# Patient Record
Sex: Male | Born: 1935 | Race: Black or African American | Hispanic: No | Marital: Married | Smoking: Former smoker
Health system: Southern US, Community
[De-identification: ages and names within clinical notes are randomized; demographics above are authoritative.]

## PROBLEM LIST (undated history)

## (undated) DIAGNOSIS — H3530 Unspecified macular degeneration: Secondary | ICD-10-CM

## (undated) DIAGNOSIS — F32A Depression, unspecified: Secondary | ICD-10-CM

## (undated) DIAGNOSIS — H269 Unspecified cataract: Secondary | ICD-10-CM

## (undated) DIAGNOSIS — K635 Polyp of colon: Secondary | ICD-10-CM

## (undated) DIAGNOSIS — F329 Major depressive disorder, single episode, unspecified: Secondary | ICD-10-CM

## (undated) DIAGNOSIS — M1990 Unspecified osteoarthritis, unspecified site: Secondary | ICD-10-CM

## (undated) DIAGNOSIS — I10 Essential (primary) hypertension: Secondary | ICD-10-CM

## (undated) DIAGNOSIS — H409 Unspecified glaucoma: Secondary | ICD-10-CM

## (undated) HISTORY — PX: POLYPECTOMY: SHX149

## (undated) HISTORY — DX: Polyp of colon: K63.5

## (undated) HISTORY — DX: Unspecified glaucoma: H40.9

## (undated) HISTORY — PX: COLONOSCOPY: SHX174

## (undated) HISTORY — DX: Unspecified cataract: H26.9

## (undated) HISTORY — DX: Essential (primary) hypertension: I10

## (undated) HISTORY — PX: CATARACT EXTRACTION: SUR2

## (undated) HISTORY — DX: Unspecified macular degeneration: H35.30

## (undated) HISTORY — DX: Depression, unspecified: F32.A

## (undated) HISTORY — PX: MULTIPLE TOOTH EXTRACTIONS: SHX2053

## (undated) HISTORY — DX: Unspecified osteoarthritis, unspecified site: M19.90

## (undated) HISTORY — DX: Major depressive disorder, single episode, unspecified: F32.9

---

## 2010-06-02 ENCOUNTER — Emergency Department (HOSPITAL_COMMUNITY): Admission: EM | Admit: 2010-06-02 | Discharge: 2010-06-02 | Payer: Self-pay | Admitting: Emergency Medicine

## 2011-07-16 ENCOUNTER — Telehealth: Payer: Self-pay | Admitting: Family Medicine

## 2011-07-16 NOTE — Telephone Encounter (Signed)
I think we should make exception in this case as I see wife.

## 2011-07-16 NOTE — Telephone Encounter (Signed)
Pt would like to establish as new medicare pt. Pt wife is seen by you and is requesting you see her husband as well. Pt was informed you are not excepting new medicare pt at this time. Please advise

## 2011-08-19 ENCOUNTER — Ambulatory Visit (INDEPENDENT_AMBULATORY_CARE_PROVIDER_SITE_OTHER): Payer: 59 | Admitting: Family Medicine

## 2011-08-19 ENCOUNTER — Encounter: Payer: Self-pay | Admitting: Family Medicine

## 2011-08-19 VITALS — BP 128/78 | HR 80 | Temp 98.5°F | Resp 12 | Ht 67.0 in | Wt 196.0 lb

## 2011-08-19 DIAGNOSIS — Z0000 Encounter for general adult medical examination without abnormal findings: Secondary | ICD-10-CM

## 2011-08-19 LAB — HEPATIC FUNCTION PANEL
AST: 16 U/L (ref 0–37)
Alkaline Phosphatase: 81 U/L (ref 39–117)
Bilirubin, Direct: 0.1 mg/dL (ref 0.0–0.3)

## 2011-08-19 LAB — CBC WITH DIFFERENTIAL/PLATELET
Basophils Absolute: 0 10*3/uL (ref 0.0–0.1)
Eosinophils Relative: 1.6 % (ref 0.0–5.0)
HCT: 41.8 % (ref 39.0–52.0)
Hemoglobin: 14.1 g/dL (ref 13.0–17.0)
Lymphocytes Relative: 33.8 % (ref 12.0–46.0)
Lymphs Abs: 2.5 10*3/uL (ref 0.7–4.0)
Monocytes Relative: 8.4 % (ref 3.0–12.0)
Neutro Abs: 4.1 10*3/uL (ref 1.4–7.7)
RDW: 15 % — ABNORMAL HIGH (ref 11.5–14.6)
WBC: 7.4 10*3/uL (ref 4.5–10.5)

## 2011-08-19 LAB — BASIC METABOLIC PANEL
BUN: 18 mg/dL (ref 6–23)
CO2: 31 mEq/L (ref 19–32)
Calcium: 9.1 mg/dL (ref 8.4–10.5)
Chloride: 107 mEq/L (ref 96–112)
Creatinine, Ser: 1.2 mg/dL (ref 0.4–1.5)
Glucose, Bld: 94 mg/dL (ref 70–99)

## 2011-08-19 LAB — LIPID PANEL
LDL Cholesterol: 104 mg/dL — ABNORMAL HIGH (ref 0–99)
Total CHOL/HDL Ratio: 3
Triglycerides: 78 mg/dL (ref 0.0–149.0)

## 2011-08-19 LAB — POCT URINALYSIS DIPSTICK
Blood, UA: NEGATIVE
Glucose, UA: NEGATIVE
Ketones, UA: NEGATIVE
Spec Grav, UA: 1.025

## 2011-08-19 LAB — TSH: TSH: 3.38 u[IU]/mL (ref 0.35–5.50)

## 2011-08-19 MED ORDER — PNEUMOCOCCAL VAC POLYVALENT 25 MCG/0.5ML IJ INJ: 0.5000 mL | INJECTION | Freq: Once | INTRAMUSCULAR | Status: DC

## 2011-08-19 MED ORDER — TETANUS-DIPHTH-ACELL PERTUSSIS 5-2.5-18.5 LF-MCG/0.5 IM SUSP: 0.5000 mL | Freq: Once | INTRAMUSCULAR | Status: DC

## 2011-08-19 NOTE — Patient Instructions (Signed)
Check on insurance coverage for shingles vaccine. 

## 2011-08-19 NOTE — Progress Notes (Signed)
  Subjective:    Patient ID: Ethan Anderson, male    DOB: 07-12-1935, 77 y.o.   MRN: [id number]  HPI  New patient to establish care and for well visit. Patient has history of reported macular degeneration. Remarkably healthy. Takes no medications. Remote history of depression currently stable off medication. No surgeries other than laser surgery for left eye secondary to reported retinal hemorrhage. Family history significant for mother with ovarian cancer otherwise unrevealing.  Patient is married. Quit smoking in his 19s. No alcohol use. Stays very active without outdoor activities.  No history of recent tetanus. No history of Pneumovax. Declines flu vaccine. No history of colon cancer screening.  Review of Systems  Constitutional: Negative for fever, activity change, appetite change and fatigue.  HENT: Negative for ear pain, congestion and trouble swallowing.   Eyes: Positive for visual disturbance. Negative for photophobia, pain, discharge, redness and itching.  Respiratory: Negative for cough, shortness of breath and wheezing.   Cardiovascular: Negative for chest pain and palpitations.  Gastrointestinal: Negative for nausea, vomiting, abdominal pain, diarrhea, constipation, blood in stool, abdominal distention and rectal pain.  Genitourinary: Negative for dysuria, hematuria and testicular pain.  Musculoskeletal: Negative for joint swelling and arthralgias.  Skin: Negative for rash.  Neurological: Negative for dizziness, syncope and headaches.  Hematological: Negative for adenopathy.  Psychiatric/Behavioral: Negative for confusion and dysphoric mood.       Objective:   Physical Exam  Constitutional: He is oriented to person, place, and time. He appears well-developed and well-nourished. No distress.  HENT:  Head: Normocephalic and atraumatic.  Right Ear: External ear normal.  Left Ear: External ear normal.  Mouth/Throat: Oropharynx is clear and moist.  Eyes: Conjunctivae and EOM  are normal. Pupils are equal, round, and reactive to light.  Neck: Normal range of motion. Neck supple. No thyromegaly present.  Cardiovascular: Normal rate, regular rhythm and normal heart sounds.   No murmur heard. Pulmonary/Chest: No respiratory distress. He has no wheezes. He has no rales.  Abdominal: Soft. Bowel sounds are normal. He exhibits no distension and no mass. There is no tenderness. There is no rebound and no guarding.  Genitourinary: Rectum normal and prostate normal.  Musculoskeletal: He exhibits no edema.  Lymphadenopathy:    He has no cervical adenopathy.  Neurological: He is alert and oriented to person, place, and time. He displays normal reflexes. No cranial nerve deficit.  Skin: No rash noted.  Psychiatric: He has a normal mood and affect. His behavior is normal. Judgment and thought content normal.          Assessment & Plan:  Health maintenance. Baseline labs obtained. Discussed pros and cons of PSA and he is interested in obtaining. Schedule screening colonoscopy. Tetanus and Pneumovax given. Influenza recommended and patient declines. He and wife will check on coverage for shingles vaccine.

## 2011-08-20 NOTE — Progress Notes (Signed)
Quick Note:  Pt aware ______ 

## 2011-09-01 ENCOUNTER — Encounter: Payer: Self-pay | Admitting: Gastroenterology

## 2011-09-23 ENCOUNTER — Ambulatory Visit (AMBULATORY_SURGERY_CENTER): Payer: 59

## 2011-09-23 VITALS — Ht 67.0 in | Wt 198.5 lb

## 2011-09-23 DIAGNOSIS — Z1211 Encounter for screening for malignant neoplasm of colon: Secondary | ICD-10-CM

## 2011-09-23 MED ORDER — PEG-KCL-NACL-NASULF-NA ASC-C 100 G PO SOLR: 1.0000 | Freq: Once | ORAL | Status: AC

## 2011-10-07 ENCOUNTER — Ambulatory Visit (AMBULATORY_SURGERY_CENTER): Payer: 59 | Admitting: Gastroenterology

## 2011-10-07 ENCOUNTER — Encounter: Payer: Self-pay | Admitting: Gastroenterology

## 2011-10-07 VITALS — BP 150/97 | HR 80 | Temp 97.0°F | Resp 11 | Ht 67.0 in | Wt 198.0 lb

## 2011-10-07 DIAGNOSIS — D126 Benign neoplasm of colon, unspecified: Secondary | ICD-10-CM

## 2011-10-07 DIAGNOSIS — Z1211 Encounter for screening for malignant neoplasm of colon: Secondary | ICD-10-CM

## 2011-10-07 MED ORDER — SODIUM CHLORIDE 0.9 % IV SOLN: 500.0000 mL | INTRAVENOUS | Status: DC

## 2011-10-07 NOTE — Progress Notes (Signed)
Patient did not experience any of the following events: a burn prior to discharge; a fall within the facility; wrong site/side/patient/procedure/implant event; or a hospital transfer or hospital admission upon discharge from the facility. (G8907) Patient did not have preoperative order for IV antibiotic SSI prophylaxis. (G8918)  

## 2011-10-07 NOTE — Patient Instructions (Signed)
YOU HAD AN ENDOSCOPIC PROCEDURE TODAY AT THE [facility]: Refer to the procedure report that was given to you for any specific questions about what was found during the examination.  If the procedure report does not answer your questions, please call your gastroenterologist to clarify.  If you requested that your care partner not be given the details of your procedure findings, then the procedure report has been included in a sealed envelope for you to review at your convenience later.  YOU SHOULD EXPECT: Some feelings of bloating in the abdomen. Passage of more gas than usual.  Walking can help get rid of the air that was put into your GI tract during the procedure and reduce the bloating. If you had a lower endoscopy (such as a colonoscopy or flexible sigmoidoscopy) you may notice spotting of blood in your stool or on the toilet paper. If you underwent a bowel prep for your procedure, then you may not have a normal bowel movement for a few days.  DIET: Your first meal following the procedure should be a light meal and then it is ok to progress to your normal diet.  A half-sandwich or bowl of soup is an example of a good first meal.  Heavy or fried foods are harder to digest and may make you feel nauseous or bloated.  Likewise meals heavy in dairy and vegetables can cause extra gas to form and this can also increase the bloating.  Drink plenty of fluids but you should avoid alcoholic beverages for 24 hours.  ACTIVITY: Your care partner should take you home directly after the procedure.  You should plan to take it easy, moving slowly for the rest of the day.  You can resume normal activity the day after the procedure however you should NOT DRIVE or use heavy machinery for 24 hours (because of the sedation medicines used during the test).    SYMPTOMS TO REPORT IMMEDIATELY: A gastroenterologist can be reached at any hour.  During normal business hours, 8:30 AM to 5:00 PM Monday through Friday,  call [phone].  After hours and on weekends, please call the GI answering service at [phone] who will take a message and have the physician on call contact you.   Following lower endoscopy (colonoscopy or flexible sigmoidoscopy):  Excessive amounts of blood in the stool  Significant tenderness or worsening of abdominal pains  Swelling of the abdomen that is new, acute  Fever of 100F or higher    FOLLOW UP: If any biopsies were taken you will be contacted by phone or by letter within the next 1-3 weeks.  Call your gastroenterologist if you have not heard about the biopsies in 3 weeks.  Our staff will call the home number listed on your records the next business day following your procedure to check on you and address any questions or concerns that you may have at that time regarding the information given to you following your procedure. This is a courtesy call and so if there is no answer at the home number and we have not heard from you through the emergency physician on call, we will assume that you have returned to your regular daily activities without incident.  SIGNATURES/CONFIDENTIALITY: You and/or your care partner have signed paperwork which will be entered into your electronic medical record.  These signatures attest to the fact that that the information above on your After Visit Summary has been reviewed and is understood.  Full responsibility of the confidentiality   of this discharge information lies with you and/or your care-partner.     

## 2011-10-07 NOTE — Progress Notes (Signed)
Propofol per [facility]. See scanned intra procedure report. ewm 

## 2011-10-07 NOTE — Op Note (Signed)
[facility] [address] [place], [place]  [address]  COLONOSCOPY PROCEDURE REPORT  PATIENT:  Ethan Anderson, Ethan Anderson  MR#:  [id number] BIRTHDATE:  09-26-1934, 76 yrs. old  GENDER:  male ENDOSCOPIST:  Barbette Hair. Arlyce Dice, MD REF. BY:  Evelena Peat, M.D. PROCEDURE DATE:  10/07/2011 PROCEDURE:  Colonoscopy with polypectomy and submucosal injection, Colonoscopy with snare polypectomy, Colonoscopy with hot biopsy ASA CLASS:  Class II INDICATIONS:  Routine Risk Screening MEDICATIONS:   MAC sedation, administered by CRNA propofol 400mg IV  DESCRIPTION OF PROCEDURE:   After the risks benefits and alternatives of the procedure were thoroughly explained, informed consent was obtained.  Digital rectal exam was performed and revealed no abnormalities.   The [id number] endoscope was introduced through the anus and advanced to the cecum, which was identified by the ileocecal valve, Moderate amount of retained liquid stool  The quality of the prep was Moviprep fair.  The instrument was then slowly withdrawn as the colon was fully examined. <<PROCEDUREIMAGES>>  FINDINGS:  A sessile polyp was found in the cecum. Sessile 2.5-3cm polyp. Submucosa was injected with 7cc NS. Polyp was removed with polypectomy snare utilizing coagulation. A small remnant was removed with hot biopsy forceps. A single clip was placed across the mucosal defect (see image5 and image8).  A sessile polyp was found in the ascending colon. It was 12 mm in size. Polyp was snared without cautery. Retrieval was successful (see image9). snare polyp  This was otherwise a normal examination of the colon (see image2 and image10).   Retroflexed views in the rectum revealed no abnormalities.    The time to cecum =  1) 2.50 minutes. The scope was then withdrawn in  1) 29.50  minutes from the cecum and the procedure completed. COMPLICATIONS:  None ENDOSCOPIC IMPRESSION: 1) Sessile polyp in the cecum 2) 12 mm sessile polyp in the  ascending colon 3) Otherwise normal examination RECOMMENDATIONS: 1) Await biopsy results REPEAT EXAM:  You will receive a letter from Dr. Arlyce Dice in 1-2 weeks, after reviewing the final pathology, with followup recommendations.  ______________________________ Barbette Hair Arlyce Dice, MD  CC:  n. eSIGNED:   Barbette Hair. Damarea Merkel at 10/07/2011 11:18 AM  Clent Jacks, [phone]

## 2011-10-08 ENCOUNTER — Telehealth: Payer: Self-pay | Admitting: *Deleted

## 2011-10-08 NOTE — Telephone Encounter (Signed)
  Follow up Call-  Call back number 10/07/2011  Post procedure Call Back phone  # [phone]  Permission to leave phone message Yes     Patient questions:  Do you have a fever, pain , or abdominal swelling? no Pain Score  0 *  Have you tolerated food without any problems? yes  Have you been able to return to your normal activities? yes  Do you have any questions about your discharge instructions: Diet   no Medications  no Follow up visit  no  Do you have questions or concerns about your Care? no  Actions: * If pain score is 4 or above: No action needed, pain <4.

## 2011-10-14 ENCOUNTER — Encounter: Payer: Self-pay | Admitting: Gastroenterology

## 2012-02-08 ENCOUNTER — Emergency Department (HOSPITAL_COMMUNITY): Payer: 59

## 2012-02-08 ENCOUNTER — Encounter (HOSPITAL_COMMUNITY): Payer: Self-pay | Admitting: Emergency Medicine

## 2012-02-08 ENCOUNTER — Emergency Department (HOSPITAL_COMMUNITY)
Admission: EM | Admit: 2012-02-08 | Discharge: 2012-02-08 | Disposition: A | Discharge status: Home / Self Care | Payer: 59 | Attending: Emergency Medicine | Admitting: Emergency Medicine

## 2012-02-08 DIAGNOSIS — F329 Major depressive disorder, single episode, unspecified: Secondary | ICD-10-CM | POA: Insufficient documentation

## 2012-02-08 DIAGNOSIS — R112 Nausea with vomiting, unspecified: Secondary | ICD-10-CM | POA: Insufficient documentation

## 2012-02-08 DIAGNOSIS — R109 Unspecified abdominal pain: Secondary | ICD-10-CM

## 2012-02-08 DIAGNOSIS — F3289 Other specified depressive episodes: Secondary | ICD-10-CM | POA: Insufficient documentation

## 2012-02-08 DIAGNOSIS — H3530 Unspecified macular degeneration: Secondary | ICD-10-CM | POA: Insufficient documentation

## 2012-02-08 DIAGNOSIS — M549 Dorsalgia, unspecified: Secondary | ICD-10-CM | POA: Insufficient documentation

## 2012-02-08 MED ORDER — HYOSCYAMINE SULFATE 0.125 MG SL SUBL
SUBLINGUAL_TABLET | SUBLINGUAL | Status: AC
  Administered 2012-02-08: 375 ug
  Filled 2012-02-08: qty 3

## 2012-02-08 MED ORDER — HYOSCYAMINE SULFATE CR 0.375 MG PO CP12: 0.3750 mg | ORAL_CAPSULE | Freq: Two times a day (BID) | ORAL | Status: DC | PRN

## 2012-02-08 MED ORDER — HYOSCYAMINE SULFATE 0.125 MG PO TABS: 0.3750 mg | ORAL_TABLET | Freq: Once | ORAL | Status: DC

## 2012-02-08 NOTE — ED Provider Notes (Cosign Needed)
History   This chart was scribed for Benny Lennert, MD by Ethan Anderson . The patient was seen in room APA04/APA04.    CSN: [id number]  Arrival date & time 02/08/12  1757   First MD Initiated Contact with Patient 02/08/12 1807      Chief Complaint  Patient presents with  . Abdominal Pain  . Back Pain    (Consider location/radiation/quality/duration/timing/severity/associated sxs/prior treatment) HPI Comments: Ethan Anderson is a 77 y.o. male who presents to the Emergency Department complaining of constant, mild to moderate, abdominal distension. Patient states that his abdomen feels "tight". Patient denies any abdominal pain. Patient reports associated n/v/d. Patient also reports mid back pain, and describes the back pain as stiffness. Patient states that his last BM was this morning. Wife states that the patient has had these symptoms previously, thought it was gas, and the symptoms subsided on their own. Patient denies taking any medications regularly.   Patient is a 77 y.o. male presenting with abdominal pain. The history is provided by the patient.  Abdominal Pain The primary symptoms of the illness include nausea, vomiting and diarrhea. The primary symptoms of the illness do not include abdominal pain or fever. The current episode started 13 to 24 hours ago. The onset of the illness was gradual. The problem has been gradually worsening.  The vomiting began today. Vomiting occurred once. The emesis contains stomach contents.  The patient has had a change in bowel habit. Additional symptoms associated with the illness include back pain. Symptoms associated with the illness do not include constipation.    Past Medical History  Diagnosis Date  . Depression   . Macular degeneration     History reviewed. No pertinent past surgical history.  Family History  Problem Relation Age of Onset  . Cancer Mother     ovarian  . Stomach cancer Mother   . Alcohol abuse Cousin   .  Colon cancer Neg Hx   . Esophageal cancer Neg Hx   . Rectal cancer Neg Hx     History  Substance Use Topics  . Smoking status: Former Smoker -- 1.0 packs/day for 10 years    Types: Cigarettes    Quit date: 08/18/1990  . Smokeless tobacco: Never Used  . Alcohol Use: No      Review of Systems  Constitutional: Negative for fever.  Gastrointestinal: Positive for nausea, vomiting and diarrhea. Negative for abdominal pain and constipation.  Musculoskeletal: Positive for back pain.  All other systems reviewed and are negative.    Allergies  Review of patient's allergies indicates no known allergies.  Home Medications  No current outpatient prescriptions on file.  BP 174/81  Pulse 72  Temp 98 F (36.7 C) (Oral)  Resp 18  Ht 5\' 9"  (1.753 m)  Wt 180 lb (81.647 kg)  BMI 26.58 kg/m2  SpO2 100%  Physical Exam  Constitutional: He is oriented to person, place, and time. He appears well-developed.  HENT:  Head: Normocephalic and atraumatic.  Eyes: Conjunctivae and EOM are normal. No scleral icterus.  Neck: Neck supple. No thyromegaly present.  Cardiovascular: Normal rate and regular rhythm.  Exam reveals no gallop and no friction rub.   No murmur heard. Pulmonary/Chest: No stridor. He has no wheezes. He has no rales. He exhibits no tenderness.  Abdominal: He exhibits distension. There is no tenderness. There is no rebound.       Mild distension of abdomen.   Musculoskeletal: Normal range of motion. He exhibits  no edema.  Lymphadenopathy:    He has no cervical adenopathy.  Neurological: He is oriented to person, place, and time. Coordination normal.  Skin: No rash noted. No erythema.  Psychiatric: He has a normal mood and affect. His behavior is normal.    ED Course  Procedures (including critical care time)  DIAGNOSTIC STUDIES: Oxygen Saturation is 100% on room air, normal by my interpretation.    COORDINATION OF CARE:  1825: Discussed planned course of treatment  with the patient, who is agreeable at this time.  1900: Recheck: Informed patient of imaging results. Discussed f/u PCP if symptoms worsen.    Labs Reviewed - No data to display Dg Abd Acute W/chest  02/08/2012  *RADIOLOGY REPORT*  Clinical Data: Abdominal distention.  Nausea vomiting.  ACUTE ABDOMEN SERIES (ABDOMEN 2 VIEW & CHEST 1 VIEW)  Comparison: None  Findings: The heart size and mediastinal contours are within normal limits.  Both lungs are clear.  The visualized skeletal structures are unremarkable.  The bowel gas pattern is nonobstructed. There are a few air-filled loops of small bowel within the central abdomen containing air fluid levels.  Gas and stool noted within the right colon.  IMPRESSION:  1.  No acute cardiopulmonary abnormalities. 2. Nonspecific bowel gas pattern without evidence for high-grade bowel obstruction.  Original Report Authenticated By: Rosealee Albee, M.D.     No diagnosis found.    MDM    The chart was scribed for me under my direct supervision.  I personally performed the history, physical, and medical decision making and all procedures in the evaluation of this patient.Benny Lennert, MD 02/08/12 [phone]

## 2012-02-08 NOTE — Discharge Instructions (Signed)
Plenty of fluids.  Follow up with your md if not improving °

## 2012-02-08 NOTE — ED Notes (Signed)
Patient with c/o abdominal distention and mid back pain. Patient's wife reports patient has been fatigued and had some shortness of breath with exertion.

## 2012-02-24 ENCOUNTER — Encounter (INDEPENDENT_AMBULATORY_CARE_PROVIDER_SITE_OTHER): Payer: 59 | Admitting: Ophthalmology

## 2012-02-24 DIAGNOSIS — H2510 Age-related nuclear cataract, unspecified eye: Secondary | ICD-10-CM

## 2012-02-24 DIAGNOSIS — H3530 Unspecified macular degeneration: Secondary | ICD-10-CM

## 2012-02-24 DIAGNOSIS — H43819 Vitreous degeneration, unspecified eye: Secondary | ICD-10-CM

## 2012-02-24 DIAGNOSIS — H348192 Central retinal vein occlusion, unspecified eye, stable: Secondary | ICD-10-CM

## 2012-08-24 ENCOUNTER — Encounter: Payer: Self-pay | Admitting: Gastroenterology

## 2012-09-03 ENCOUNTER — Encounter: Payer: Self-pay | Admitting: Gastroenterology

## 2012-09-09 ENCOUNTER — Ambulatory Visit (AMBULATORY_SURGERY_CENTER): Payer: 59 | Admitting: *Deleted

## 2012-09-09 ENCOUNTER — Encounter: Payer: Self-pay | Admitting: Gastroenterology

## 2012-09-09 VITALS — Ht 66.0 in | Wt 199.0 lb

## 2012-09-09 DIAGNOSIS — Z1211 Encounter for screening for malignant neoplasm of colon: Secondary | ICD-10-CM

## 2012-09-09 DIAGNOSIS — Z86010 Personal history of colonic polyps: Secondary | ICD-10-CM

## 2012-09-09 MED ORDER — NA SULFATE-K SULFATE-MG SULF 17.5-3.13-1.6 GM/177ML PO SOLN: 1.0000 | Freq: Once | ORAL | Status: DC

## 2012-09-09 NOTE — Progress Notes (Signed)
No egg or soy allergy. ewm No problems with sedation. ewm Nothing electrical in heart. ewm

## 2012-09-21 ENCOUNTER — Encounter: Payer: Self-pay | Admitting: Gastroenterology

## 2012-09-21 ENCOUNTER — Ambulatory Visit (AMBULATORY_SURGERY_CENTER): Payer: 59 | Admitting: Gastroenterology

## 2012-09-21 VITALS — BP 148/95 | HR 73 | Temp 97.9°F | Resp 12 | Ht 66.0 in | Wt 199.0 lb

## 2012-09-21 DIAGNOSIS — D126 Benign neoplasm of colon, unspecified: Secondary | ICD-10-CM

## 2012-09-21 DIAGNOSIS — Z1211 Encounter for screening for malignant neoplasm of colon: Secondary | ICD-10-CM

## 2012-09-21 DIAGNOSIS — Z86010 Personal history of colonic polyps: Secondary | ICD-10-CM

## 2012-09-21 MED ORDER — SODIUM CHLORIDE 0.9 % IV SOLN: 500.0000 mL | INTRAVENOUS | Status: DC

## 2012-09-21 NOTE — Progress Notes (Signed)
Called to room to assist during endoscopic procedure.  Patient ID and intended procedure confirmed with present staff. Received instructions for my participation in the procedure from the performing physician.  

## 2012-09-21 NOTE — Op Note (Signed)
[facility] [address] [place], [address]   COLONOSCOPY PROCEDURE REPORT  PATIENT: Ethan Anderson, Ethan Anderson  MR#: [id number] BIRTHDATE: 03-28-1935 , 77  yrs. old GENDER: Male ENDOSCOPIST: Louis Meckel, MD REFERRED XB:JYNWG Caryl Never, M.D. PROCEDURE DATE:  09/21/2012 PROCEDURE:   Colonoscopy with cold biopsy polypectomy ASA CLASS:   Class II INDICATIONS:Patient's personal history of colon polyps; sessile cecal polyp one year ago. MEDICATIONS: MAC sedation, administered by CRNA and propofol (Diprivan) 200mg  IV  DESCRIPTION OF PROCEDURE:   After the risks benefits and alternatives of the procedure were thoroughly explained, informed consent was obtained.  A digital rectal exam revealed no abnormalities of the rectum.   The LB [id number]  endoscope was introduced through the anus and advanced to the cecum, which was identified by both the appendix and ileocecal valve. No adverse events experienced.   The quality of the prep was Suprep excellent The instrument was then slowly withdrawn as the colon was fully examined.      COLON FINDINGS: A flat polyp measuring 2 mm in size was found in the ascending colon.  A polypectomy was performed with cold forceps. The resection was complete and the polyp tissue was completely retrieved.   The colon mucosa was otherwise normal.  Retroflexed views revealed no abnormalities. The time to cecum=3 minutes 08 seconds.  Withdrawal time=9 minutes 48 seconds.  The scope was withdrawn and the procedure completed. COMPLICATIONS: There were no complications.  ENDOSCOPIC IMPRESSION: 1.   Flat polyp measuring 2 mm in size was found in the ascending colon; polypectomy was performed with cold forceps 2.   The colon mucosa was otherwise normal  RECOMMENDATIONS: Colonoscopy in 3 years   eSigned:  Louis Meckel, MD 09/21/2012 12:31 PM   cc:

## 2012-09-21 NOTE — Progress Notes (Signed)
Patient did not experience any of the following events: a burn prior to discharge; a fall within the facility; wrong site/side/patient/procedure/implant event; or a hospital transfer or hospital admission upon discharge from the facility. (G8907) Patient did not have preoperative order for IV antibiotic SSI prophylaxis. (G8918)  

## 2012-09-21 NOTE — Patient Instructions (Addendum)
YOU HAD AN ENDOSCOPIC PROCEDURE TODAY AT THE [facility]: Refer to the procedure report that was given to you for any specific questions about what was found during the examination.  If the procedure report does not answer your questions, please call your gastroenterologist to clarify.  If you requested that your care partner not be given the details of your procedure findings, then the procedure report has been included in a sealed envelope for you to review at your convenience later.  YOU SHOULD EXPECT: Some feelings of bloating in the abdomen. Passage of more gas than usual.  Walking can help get rid of the air that was put into your GI tract during the procedure and reduce the bloating. If you had a lower endoscopy (such as a colonoscopy or flexible sigmoidoscopy) you may notice spotting of blood in your stool or on the toilet paper. If you underwent a bowel prep for your procedure, then you may not have a normal bowel movement for a few days.  DIET: Your first meal following the procedure should be a light meal and then it is ok to progress to your normal diet.  A half-sandwich or bowl of soup is an example of a good first meal.  Heavy or fried foods are harder to digest and may make you feel nauseous or bloated.  Likewise meals heavy in dairy and vegetables can cause extra gas to form and this can also increase the bloating.  Drink plenty of fluids but you should avoid alcoholic beverages for 24 hours.  ACTIVITY: Your care partner should take you home directly after the procedure.  You should plan to take it easy, moving slowly for the rest of the day.  You can resume normal activity the day after the procedure however you should NOT DRIVE or use heavy machinery for 24 hours (because of the sedation medicines used during the test).    SYMPTOMS TO REPORT IMMEDIATELY: A gastroenterologist can be reached at any hour.  During normal business hours, 8:30 AM to 5:00 PM Monday through Friday,  call [phone].  After hours and on weekends, please call the GI answering service at [phone] emergency number  who will take a message and have the physician on call contact you.   Following lower endoscopy (colonoscopy or flexible sigmoidoscopy):  Excessive amounts of blood in the stool  Significant tenderness or worsening of abdominal pains  Swelling of the abdomen that is new, acute  Fever of 100F or higher  FOLLOW UP: If any biopsies were taken you will be contacted by phone or by letter within the next 1-3 weeks.  Call your gastroenterologist if you have not heard about the biopsies in 3 weeks.  Our staff will call the home number listed on your records the next business day following your procedure to check on you and address any questions or concerns that you may have at that time regarding the information given to you following your procedure. This is a courtesy call and so if there is no answer at the home number and we have not heard from you through the emergency physician on call, we will assume that you have returned to your regular daily activities without incident.  SIGNATURES/CONFIDENTIALITY: You and/or your care partner have signed paperwork which will be entered into your electronic medical record.  These signatures attest to the fact that that the information above on your After Visit Summary has been reviewed and is understood.  Full responsibility of the   confidentiality of this discharge information lies with you and/or your care-partner.  Handout on polyps Repeat colon in 3 years

## 2012-09-22 ENCOUNTER — Telehealth: Payer: Self-pay | Admitting: *Deleted

## 2012-09-22 NOTE — Telephone Encounter (Signed)
  Follow up Call-  Call back number 09/21/2012 10/07/2011  Post procedure Call Back phone  # [phone]  Permission to leave phone message Yes Yes     Patient questions:  Do you have a fever, pain , or abdominal swelling? no Pain Score  0 *  Have you tolerated food without any problems? yes  Have you been able to return to your normal activities? yes  Do you have any questions about your discharge instructions: Diet   no Medications  no Follow up visit  no  Do you have questions or concerns about your Care? no  Actions: * If pain score is 4 or above: No action needed, pain <4.

## 2012-09-29 ENCOUNTER — Encounter: Payer: Self-pay | Admitting: Gastroenterology

## 2013-02-23 ENCOUNTER — Ambulatory Visit (INDEPENDENT_AMBULATORY_CARE_PROVIDER_SITE_OTHER): Payer: Medicare Other | Admitting: Ophthalmology

## 2013-02-23 DIAGNOSIS — H2510 Age-related nuclear cataract, unspecified eye: Secondary | ICD-10-CM

## 2013-02-23 DIAGNOSIS — H43819 Vitreous degeneration, unspecified eye: Secondary | ICD-10-CM

## 2013-02-23 DIAGNOSIS — H3530 Unspecified macular degeneration: Secondary | ICD-10-CM

## 2013-02-23 DIAGNOSIS — H348192 Central retinal vein occlusion, unspecified eye, stable: Secondary | ICD-10-CM

## 2013-07-08 ENCOUNTER — Telehealth: Payer: Self-pay | Admitting: Family Medicine

## 2013-07-08 NOTE — Telephone Encounter (Signed)
Patient Information:  Caller Name: Ethan Anderson  Phone: [phone]  Patient: Ethan Anderson, Ethan Anderson  Gender: Male  DOB: 1934-12-01  Age: 78 Years  PCP: Evelena Peat [facility])  Office Follow Up:  Does the office need to follow up with this patient?: Yes  Instructions For The Office: No appts. available at this office. Spouse declines for patient to be seen at another office location. Apouse requests that patient be seen by Dr. Caryl Never. Please return call to Patient at [phone] regarding possible work in appt.  RN Note:  Spouse states patient has had intermittent elevated blood pressure readings, onset 06/14/13. States blood pressure was 175/114, pulse 99 at 0730 07/08/13. States repeat blood pressure was 133/85, pulse rate 91 at 1243 07/08/13. Denies headache or dizziness. Denies numbness or tingling. Denies extremity weakness. Denies visual disturbances. No swelling noted of extremities. Care advice and diet advice given per guidelines. Call back parameters reviewed. Patient verbalizes understanding. No appts. available at this office. Spouse declines for patient to be seen at another office location. Apouse requests that patient be seen by Dr. Caryl Never. Please return call to Patient at [phone] regarding possible work in appt.   Symptoms  Reason For Call & Symptoms: Elevated Blood Pressure  Reviewed Health History In EMR: Yes  Reviewed Medications In EMR: Yes  Reviewed Allergies In EMR: Yes  Reviewed Surgeries / Procedures: Yes  Date of Onset of Symptoms: 07/08/2013  Guideline(s) Used:  High Blood Pressure  Disposition Per Guideline:   See Today in Office  Reason For Disposition Reached:   BP > 180/110  Advice Given:  Call Back If:  Headache, blurred vision, difficulty talking, or difficulty walking occurs  Chest pain or difficulty breathing occurs  You want to come in to the office for a blood pressure check  You become worse.  Patient Will Follow Care  Advice:  YES

## 2013-07-08 NOTE — Telephone Encounter (Signed)
Spoke with wife and she will continue to observe the patients blood pressure. If it does not decrease or if it contiunes to increase she will take him to urgent care.  Otherwise an appointment made with Dr Caryl Never for Monday

## 2013-07-11 ENCOUNTER — Encounter: Payer: Self-pay | Admitting: Family Medicine

## 2013-07-11 ENCOUNTER — Ambulatory Visit (INDEPENDENT_AMBULATORY_CARE_PROVIDER_SITE_OTHER): Payer: 59 | Admitting: Family Medicine

## 2013-07-11 VITALS — BP 144/84 | HR 107 | Temp 98.7°F | Wt 199.0 lb

## 2013-07-11 DIAGNOSIS — R030 Elevated blood-pressure reading, without diagnosis of hypertension: Secondary | ICD-10-CM

## 2013-07-11 NOTE — Progress Notes (Signed)
Pre visit review using our clinic review tool, if applicable. No additional management support is needed unless otherwise documented below in the visit note. 

## 2013-07-11 NOTE — Patient Instructions (Signed)
Bring your home cuff to check with ours. Reduce your sodium (salt) intake.
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
## 2013-08-23 ENCOUNTER — Encounter: Payer: Self-pay | Admitting: Family Medicine

## 2013-08-23 ENCOUNTER — Ambulatory Visit (INDEPENDENT_AMBULATORY_CARE_PROVIDER_SITE_OTHER): Payer: 59 | Admitting: Family Medicine

## 2013-08-23 VITALS — BP 140/80 | HR 95 | Temp 98.9°F | Ht 66.0 in | Wt 202.0 lb

## 2013-08-23 DIAGNOSIS — Z125 Encounter for screening for malignant neoplasm of prostate: Secondary | ICD-10-CM

## 2013-08-23 DIAGNOSIS — Z136 Encounter for screening for cardiovascular disorders: Secondary | ICD-10-CM

## 2013-08-23 DIAGNOSIS — Z0000 Encounter for general adult medical examination without abnormal findings: Secondary | ICD-10-CM

## 2013-08-23 LAB — CBC WITH DIFFERENTIAL/PLATELET
BASOS PCT: 0.7 % (ref 0.0–3.0)
Basophils Absolute: 0 10*3/uL (ref 0.0–0.1)
EOS ABS: 0.2 10*3/uL (ref 0.0–0.7)
Eosinophils Relative: 2.2 % (ref 0.0–5.0)
HCT: 42.8 % (ref 39.0–52.0)
HEMOGLOBIN: 14.4 g/dL (ref 13.0–17.0)
LYMPHS PCT: 31.6 % (ref 12.0–46.0)
Lymphs Abs: 2.3 10*3/uL (ref 0.7–4.0)
MCHC: 33.7 g/dL (ref 30.0–36.0)
MCV: 84.3 fl (ref 78.0–100.0)
MONOS PCT: 10.1 % (ref 3.0–12.0)
Monocytes Absolute: 0.8 10*3/uL (ref 0.1–1.0)
NEUTROS ABS: 4.1 10*3/uL (ref 1.4–7.7)
Neutrophils Relative %: 55.4 % (ref 43.0–77.0)
Platelets: 242 10*3/uL (ref 150.0–400.0)
RBC: 5.07 Mil/uL (ref 4.22–5.81)
RDW: 14.3 % (ref 11.5–14.6)
WBC: 7.4 10*3/uL (ref 4.5–10.5)

## 2013-08-23 LAB — BASIC METABOLIC PANEL
BUN: 15 mg/dL (ref 6–23)
CHLORIDE: 104 meq/L (ref 96–112)
CO2: 30 mEq/L (ref 19–32)
CREATININE: 1.1 mg/dL (ref 0.4–1.5)
Calcium: 9.5 mg/dL (ref 8.4–10.5)
GFR: 81.05 mL/min (ref >60.00)
Glucose, Bld: 89 mg/dL (ref 70–99)
Potassium: 4.9 mEq/L (ref 3.5–5.1)
Sodium: 143 mEq/L (ref 135–145)

## 2013-08-23 LAB — LIPID PANEL
CHOLESTEROL: 159 mg/dL (ref 0–200)
HDL: 47.4 mg/dL (ref >39.00)
LDL CALC: 81 mg/dL (ref 0–99)
Total CHOL/HDL Ratio: 3
Triglycerides: 154 mg/dL — ABNORMAL HIGH (ref 0.0–149.0)
VLDL: 30.8 mg/dL (ref 0.0–40.0)

## 2013-08-23 LAB — HEPATIC FUNCTION PANEL
ALT: 16 U/L (ref 0–53)
AST: 16 U/L (ref 0–37)
Albumin: 4.2 g/dL (ref 3.5–5.2)
Alkaline Phosphatase: 102 U/L (ref 39–117)
Bilirubin, Direct: 0.1 mg/dL (ref 0.0–0.3)
TOTAL PROTEIN: 7.1 g/dL (ref 6.0–8.3)
Total Bilirubin: 0.7 mg/dL (ref 0.3–1.2)

## 2013-08-23 LAB — TSH: TSH: 3.48 u[IU]/mL (ref 0.35–5.50)

## 2013-08-23 LAB — PSA: PSA: 7.03 ng/mL — AB (ref 0.10–4.00)

## 2013-08-23 NOTE — Progress Notes (Signed)
Subjective:    Patient ID: Ethan Anderson, male    DOB: Mar 02, 1935, 79 y.o.   MRN: [id number]  HPI Patient seen for Medicare wellness exam and physical examination. He has no significant chronic medical problems. Takes aspirin but no prescription medications. He does have question of macular degeneration (per patient report) and also a cataract followed by ophthalmology He had previous colonoscopy. He has declined shingles vaccine and flu vaccine. Previous Pneumovax. Tetanus is up-to-date.  He stays quite active. He had several labs last year which were all normal.  Past Medical History  Diagnosis Date  . Depression   . Macular degeneration    Past Surgical History  Procedure Laterality Date  . Colonoscopy    . Polypectomy      reports that he quit smoking about 23 years ago. His smoking use included Cigarettes. He has a 10 pack-year smoking history. He has never used smokeless tobacco. He reports that he does not drink alcohol or use illicit drugs. family history includes Alcohol abuse in his cousin; Cancer in his mother; Stomach cancer in his mother. There is no history of Colon cancer, Esophageal cancer, or Rectal cancer. No Known Allergies  1.  Risk factors based on Past Medical , Social, and Family history reviewed and as above 2.  Limitations in physical activities very active. Low risk for falls 3.  Depression/mood no issues or concerns 4.  Hearing no deficits 5.  ADLs independent in all 6.  Cognitive function (orientation to time and place, language, writing, speech,memory) no memory deficits. Language and judgment intact 7.  Home Safety no issues 8.  Height, weight, and visual acuity. Recent visual problems left followed by ophthalmology with cataract 9.  Counseling discussed diet exercise and fall prevention 10. Recommendation of preventive services. Flu vaccine in shingles vaccine recommended and patient declines both 11. Labs based on risk factors CBC, basic metabolic  panel, lipid panel, hepatic panel. Long discussion with patient regarding PSA screening and he is requesting this and we have discussed pros and cons and would not recommend further screening beyond his age 78. Care Plan as above    Review of Systems  Constitutional: Negative for fever, activity change, appetite change and fatigue.  HENT: Negative for congestion, ear pain and trouble swallowing.   Eyes: Negative for pain and visual disturbance.  Respiratory: Negative for cough, shortness of breath and wheezing.   Cardiovascular: Negative for chest pain and palpitations.  Gastrointestinal: Negative for nausea, vomiting, abdominal pain, diarrhea, constipation, blood in stool, abdominal distention and rectal pain.  Genitourinary: Negative for dysuria, hematuria and testicular pain.  Musculoskeletal: Negative for arthralgias and joint swelling.  Skin: Negative for rash.  Neurological: Negative for dizziness, syncope and headaches.  Hematological: Negative for adenopathy.  Psychiatric/Behavioral: Negative for confusion and dysphoric mood.       Objective:   Physical Exam  Constitutional: He is oriented to person, place, and time. He appears well-developed and well-nourished. No distress.  HENT:  Head: Normocephalic and atraumatic.  Right Ear: External ear normal.  Left Ear: External ear normal.  Mouth/Throat: Oropharynx is clear and moist.  Eyes: Conjunctivae and EOM are normal. Pupils are equal, round, and reactive to light.  Neck: Normal range of motion. Neck supple. No thyromegaly present.  Cardiovascular: Normal rate, regular rhythm and normal heart sounds.   No murmur heard. Pulmonary/Chest: No respiratory distress. He has no wheezes. He has no rales.  Abdominal: Soft. Bowel sounds are normal. He exhibits no distension and  no mass. There is no tenderness. There is no rebound and no guarding.  Musculoskeletal: He exhibits no edema.  Lymphadenopathy:    He has no cervical  adenopathy.  Neurological: He is alert and oriented to person, place, and time. He displays normal reflexes. No cranial nerve deficit.  Skin: No rash noted.  Psychiatric: He has a normal mood and affect. His behavior is normal. Judgment and thought content normal.          Assessment & Plan:  Wellness visit/CPE. We discussed prevention issues. He declines flu vaccine and shingles vaccine. We discussed the importance of regular weightbearing exercise. He is a nonsmoker. Colonoscopy up to date. Pneumovax up-to-date. Fall risk reduction discussed.  Discussed pros and cons of PSA testing and he is requesting.   We explained we would probably not recommend further PSA beyond next year given his age.

## 2013-08-23 NOTE — Progress Notes (Signed)
Pre visit review using our clinic review tool, if applicable. No additional management support is needed unless otherwise documented below in the visit note. 

## 2013-08-23 NOTE — Patient Instructions (Signed)
Let us know if you change your mind regarding flu vaccine or shingles vaccine.

## 2013-08-24 ENCOUNTER — Other Ambulatory Visit: Payer: Self-pay

## 2013-08-24 MED ORDER — CIPROFLOXACIN HCL 500 MG PO TABS: 500.0000 mg | ORAL_TABLET | Freq: Two times a day (BID) | ORAL | Status: DC

## 2013-09-27 ENCOUNTER — Ambulatory Visit: Payer: 59 | Admitting: Family Medicine

## 2013-09-29 ENCOUNTER — Ambulatory Visit (INDEPENDENT_AMBULATORY_CARE_PROVIDER_SITE_OTHER): Payer: 59 | Admitting: Family Medicine

## 2013-09-29 ENCOUNTER — Encounter: Payer: Self-pay | Admitting: Family Medicine

## 2013-09-29 VITALS — BP 140/90 | HR 86 | Temp 97.7°F | Wt 207.0 lb

## 2013-09-29 DIAGNOSIS — R972 Elevated prostate specific antigen [PSA]: Secondary | ICD-10-CM

## 2013-09-29 LAB — PSA: PSA: 1.71 ng/mL (ref 0.10–4.00)

## 2013-09-29 NOTE — Progress Notes (Signed)
   Subjective:    Patient ID: Ethan Anderson, male    DOB: 09-02-1934, 79 y.o.   MRN: [id number]  HPI Here for followup regarding recent elevated PSA. We had a long discussion regarding pros and cons of PSA testing and they had requested getting this. He had PSA 1.3-2 years ago and recent PSA 7.03. He has occasional nocturia but does sometimes drink caffeine at night. Denies any obstructive urinary symptoms. No urinary urgency. We placed him on Cipro 500 mg twice a day for 10 days. He is here now for recheck. Has not had any prior history of elevated PSA  Past Medical History  Diagnosis Date  . Depression   . Macular degeneration    Past Surgical History  Procedure Laterality Date  . Colonoscopy    . Polypectomy      reports that he quit smoking about 23 years ago. His smoking use included Cigarettes. He has a 10 pack-year smoking history. He has never used smokeless tobacco. He reports that he does not drink alcohol or use illicit drugs. family history includes Alcohol abuse in his cousin; Cancer in his mother; Stomach cancer in his mother. There is no history of Colon cancer, Esophageal cancer, or Rectal cancer. No Known Allergies    Review of Systems  Constitutional: Negative for fever, chills, appetite change and unexpected weight change.  Genitourinary: Negative for dysuria, urgency and hematuria.       Objective:   Physical Exam  Constitutional: He appears well-developed and well-nourished.  Cardiovascular: Normal rate and regular rhythm.   Pulmonary/Chest: Effort normal and breath sounds normal. No respiratory distress. He has no wheezes. He has no rales.  Genitourinary:  Rectal exam reveals no mass. Prostate exam was somewhat difficult -to get to full extent of prostate. Slightly enlarged prostate but no definite nodules noted.          Assessment & Plan:  Elevated PSA. He had significant increase from 1.3-2 years ago to current level 7.03. Recent antibiotics  prescribed. Repeat PSA today. Consider urology referral if elevating

## 2013-09-29 NOTE — Progress Notes (Signed)
Pre visit review using our clinic review tool, if applicable. No additional management support is needed unless otherwise documented below in the visit note. 

## 2014-02-23 ENCOUNTER — Ambulatory Visit (INDEPENDENT_AMBULATORY_CARE_PROVIDER_SITE_OTHER): Payer: Medicare Other | Admitting: Ophthalmology

## 2014-03-01 ENCOUNTER — Ambulatory Visit (INDEPENDENT_AMBULATORY_CARE_PROVIDER_SITE_OTHER): Payer: Medicare Other | Admitting: Ophthalmology

## 2014-03-01 DIAGNOSIS — H3530 Unspecified macular degeneration: Secondary | ICD-10-CM

## 2014-03-01 DIAGNOSIS — H43819 Vitreous degeneration, unspecified eye: Secondary | ICD-10-CM

## 2014-03-01 DIAGNOSIS — H2510 Age-related nuclear cataract, unspecified eye: Secondary | ICD-10-CM

## 2014-03-01 DIAGNOSIS — H348192 Central retinal vein occlusion, unspecified eye, stable: Secondary | ICD-10-CM | POA: Diagnosis not present

## 2015-03-06 ENCOUNTER — Encounter: Payer: Self-pay | Admitting: Family Medicine

## 2015-03-06 ENCOUNTER — Ambulatory Visit (INDEPENDENT_AMBULATORY_CARE_PROVIDER_SITE_OTHER): Payer: Medicare Other | Admitting: Family Medicine

## 2015-03-06 VITALS — BP 134/80 | HR 74 | Temp 97.7°F | Ht 66.0 in | Wt 204.0 lb

## 2015-03-06 DIAGNOSIS — Z0000 Encounter for general adult medical examination without abnormal findings: Secondary | ICD-10-CM | POA: Diagnosis not present

## 2015-03-06 DIAGNOSIS — Z23 Encounter for immunization: Secondary | ICD-10-CM | POA: Diagnosis not present

## 2015-03-06 DIAGNOSIS — H409 Unspecified glaucoma: Secondary | ICD-10-CM

## 2015-03-06 LAB — LIPID PANEL
CHOLESTEROL: 193 mg/dL (ref 0–200)
HDL: 51.1 mg/dL (ref >39.00)
LDL Cholesterol: 118 mg/dL — ABNORMAL HIGH (ref 0–99)
NonHDL: 141.9
Total CHOL/HDL Ratio: 4
Triglycerides: 121 mg/dL (ref 0.0–149.0)
VLDL: 24.2 mg/dL (ref 0.0–40.0)

## 2015-03-06 LAB — CBC WITH DIFFERENTIAL/PLATELET
BASOS ABS: 0 10*3/uL (ref 0.0–0.1)
BASOS PCT: 0.6 % (ref 0.0–3.0)
EOS ABS: 0.2 10*3/uL (ref 0.0–0.7)
Eosinophils Relative: 3.1 % (ref 0.0–5.0)
HEMATOCRIT: 43.4 % (ref 39.0–52.0)
HEMOGLOBIN: 14.5 g/dL (ref 13.0–17.0)
Lymphocytes Relative: 39.7 % (ref 12.0–46.0)
Lymphs Abs: 2.8 10*3/uL (ref 0.7–4.0)
MCHC: 33.4 g/dL (ref 30.0–36.0)
MCV: 85.3 fl (ref 78.0–100.0)
MONOS PCT: 10.7 % (ref 3.0–12.0)
Monocytes Absolute: 0.8 10*3/uL (ref 0.1–1.0)
Neutro Abs: 3.3 10*3/uL (ref 1.4–7.7)
Neutrophils Relative %: 45.9 % (ref 43.0–77.0)
Platelets: 255 10*3/uL (ref 150.0–400.0)
RBC: 5.09 Mil/uL (ref 4.22–5.81)
RDW: 15.1 % (ref 11.5–15.5)
WBC: 7.2 10*3/uL (ref 4.0–10.5)

## 2015-03-06 LAB — BASIC METABOLIC PANEL
BUN: 12 mg/dL (ref 6–23)
CALCIUM: 9.7 mg/dL (ref 8.4–10.5)
CHLORIDE: 104 meq/L (ref 96–112)
CO2: 30 mEq/L (ref 19–32)
Creatinine, Ser: 1.04 mg/dL (ref 0.40–1.50)
GFR: 88.83 mL/min (ref >60.00)
GLUCOSE: 92 mg/dL (ref 70–99)
Potassium: 4.4 mEq/L (ref 3.5–5.1)
Sodium: 141 mEq/L (ref 135–145)

## 2015-03-06 LAB — HEPATIC FUNCTION PANEL
ALBUMIN: 4.3 g/dL (ref 3.5–5.2)
ALK PHOS: 95 U/L (ref 39–117)
ALT: 17 U/L (ref 0–53)
AST: 17 U/L (ref 0–37)
BILIRUBIN DIRECT: 0.1 mg/dL (ref 0.0–0.3)
Total Bilirubin: 0.4 mg/dL (ref 0.2–1.2)
Total Protein: 7.6 g/dL (ref 6.0–8.3)

## 2015-03-06 LAB — TSH: TSH: 3.41 u[IU]/mL (ref 0.35–4.50)

## 2015-03-06 NOTE — Patient Instructions (Signed)
Check on insurance coverage for shingles vaccine  Consider yearly flu vaccine

## 2015-03-06 NOTE — Progress Notes (Signed)
Subjective:    Patient ID: Ethan Anderson, male    DOB: 08-21-1934, 80 y.o.   MRN: [id number]  HPI Patient here for complete physical. Generally very healthy. He has history of macular degeneration and apparently recently diagnosed with glaucoma,. Followed by ophthalmology closely. He takes no regular prescription medications other than some type of eyedrop. He has not had shingles vaccine. No history of Prevnar 13. Does not consistently get flu shot. Colonoscopy 2 years ago. Tetanus up-to-date. Nonsmoker  Reviewed with no changes:  Past Medical History  Diagnosis Date  . Depression   . Macular degeneration    Past Surgical History  Procedure Laterality Date  . Colonoscopy    . Polypectomy      reports that he quit smoking about 24 years ago. His smoking use included Cigarettes. He has a 10 pack-year smoking history. He has never used smokeless tobacco. He reports that he does not drink alcohol or use illicit drugs. family history includes Alcohol abuse in his cousin; Cancer in his mother; Stomach cancer in his mother. There is no history of Colon cancer, Esophageal cancer, or Rectal cancer. No Known Allergies    Review of Systems  Constitutional: Negative for fever, activity change, appetite change, fatigue and unexpected weight change.  HENT: Negative for congestion, ear pain and trouble swallowing.   Eyes: Negative for pain and visual disturbance.  Respiratory: Negative for cough, shortness of breath and wheezing.   Cardiovascular: Negative for chest pain and palpitations.  Gastrointestinal: Negative for nausea, vomiting, abdominal pain, diarrhea, constipation, blood in stool, abdominal distention and rectal pain.  Endocrine: Negative for polydipsia and polyuria.  Genitourinary: Negative for dysuria, hematuria and testicular pain.  Musculoskeletal: Negative for joint swelling and arthralgias.  Skin: Negative for rash.  Neurological: Negative for dizziness, syncope and  headaches.  Hematological: Negative for adenopathy.  Psychiatric/Behavioral: Negative for confusion and dysphoric mood.       Objective:   Physical Exam  Constitutional: He is oriented to person, place, and time. He appears well-developed and well-nourished. No distress.  HENT:  Head: Normocephalic and atraumatic.  Right Ear: External ear normal.  Left Ear: External ear normal.  Mouth/Throat: Oropharynx is clear and moist.  Eyes: Conjunctivae and EOM are normal. Pupils are equal, round, and reactive to light.  Neck: Normal range of motion. Neck supple. No thyromegaly present.  Cardiovascular: Normal rate, regular rhythm and normal heart sounds.   No murmur heard. Pulmonary/Chest: No respiratory distress. He has no wheezes. He has no rales.  Abdominal: Soft. Bowel sounds are normal. He exhibits no distension. There is no tenderness. There is no rebound and no guarding.  He has soft nontender ventral hernia which is easily reducible  Musculoskeletal: He exhibits no edema.  Lymphadenopathy:    He has no cervical adenopathy.  Neurological: He is alert and oriented to person, place, and time. He displays normal reflexes. No cranial nerve deficit.  Skin: No rash noted.  Psychiatric: He has a normal mood and affect.          Assessment & Plan:  Complete physical. Check on coverage for shingles vaccine. Prevnar 13 given. Recommend yearly flu vaccine. Obtain screening labs. The natural history of prostate cancer and ongoing controversy regarding screening and potential treatment outcomes of prostate cancer has been discussed with the patient. The meaning of a false positive PSA and a false negative PSA has been discussed. He indicates understanding of the limitations of this screening test and wishes not to proceed  with screening PSA testing.

## 2015-03-06 NOTE — Addendum Note (Signed)
Addended by: Marcina Millard on: 03/06/2015 09:44 AM   Modules accepted: Orders

## 2015-03-06 NOTE — Progress Notes (Signed)
Pre visit review using our clinic review tool, if applicable. No additional management support is needed unless otherwise documented below in the visit note. 

## 2015-03-09 ENCOUNTER — Ambulatory Visit (INDEPENDENT_AMBULATORY_CARE_PROVIDER_SITE_OTHER): Payer: Medicare Other | Admitting: Ophthalmology

## 2015-03-09 DIAGNOSIS — H3531 Nonexudative age-related macular degeneration: Secondary | ICD-10-CM

## 2015-03-09 DIAGNOSIS — H34812 Central retinal vein occlusion, left eye: Secondary | ICD-10-CM

## 2015-03-09 DIAGNOSIS — H43813 Vitreous degeneration, bilateral: Secondary | ICD-10-CM

## 2015-09-21 ENCOUNTER — Encounter: Payer: Self-pay | Admitting: Gastroenterology

## 2015-10-24 ENCOUNTER — Telehealth: Payer: Self-pay | Admitting: *Deleted

## 2015-10-24 ENCOUNTER — Encounter: Payer: Self-pay | Admitting: Family Medicine

## 2015-10-24 ENCOUNTER — Ambulatory Visit (INDEPENDENT_AMBULATORY_CARE_PROVIDER_SITE_OTHER): Payer: Medicare Other | Admitting: Family Medicine

## 2015-10-24 VITALS — BP 162/90 | HR 92 | Temp 98.1°F | Ht 66.0 in | Wt 210.7 lb

## 2015-10-24 DIAGNOSIS — M79671 Pain in right foot: Secondary | ICD-10-CM

## 2015-10-24 DIAGNOSIS — O162 Unspecified maternal hypertension, second trimester: Secondary | ICD-10-CM

## 2015-10-24 MED ORDER — PREDNISONE 20 MG PO TABS: ORAL_TABLET | ORAL | Status: DC

## 2015-10-24 NOTE — Progress Notes (Signed)
   Subjective:    Patient ID: Ethan Anderson, male    DOB: 08/25/1934, 81 y.o.   MRN: [id number]  HPI  Patient seen for acute problem of right foot edema and pain  no history of injury. He states about a month ago he had similar pain swelling and eventually improved after several days after taking some Aleve.  Then had recurrence of pain about a week ago. He again took some Aleve which again helped.  In addition to swelling he's noticed some slight warmth. His pain has been mostly metatarsophalangeal joint. No prior history of gout. No leg edema. No fevers or chills. No generalized arthralgias. He has a son who apparently has been diagnosed with gout. No alcohol use.   Elevated blood pressure here today. Never diagnosed with hypertension. No recent headaches, chest pains, or dyspnea. No regular alcohol use. Only occasional nonsteroidal use as above  Past Medical History  Diagnosis Date  . Depression   . Macular degeneration    Past Surgical History  Procedure Laterality Date  . Colonoscopy    . Polypectomy      reports that he quit smoking about 25 years ago. His smoking use included Cigarettes. He has a 10 pack-year smoking history. He has never used smokeless tobacco. He reports that he does not drink alcohol or use illicit drugs. family history includes Alcohol abuse in his cousin; Ovarian cancer in his mother; Stomach cancer in his mother. There is no history of Colon cancer, Esophageal cancer, or Rectal cancer. No Known Allergies    Review of Systems  Constitutional: Negative for fever, chills and fatigue.  Eyes: Negative for visual disturbance.  Respiratory: Negative for cough, chest tightness and shortness of breath.   Cardiovascular: Negative for chest pain, palpitations and leg swelling.  Musculoskeletal: Negative for gait problem.  Skin: Negative for rash.  Neurological: Negative for dizziness, syncope, weakness, light-headedness and headaches.       Objective:   Physical Exam  Constitutional: He appears well-developed and well-nourished. No distress.  Neck: Neck supple. No thyromegaly present.  Cardiovascular: Normal rate and regular rhythm.   Pulmonary/Chest: Effort normal and breath sounds normal. No respiratory distress. He has no wheezes. He has no rales.  Musculoskeletal:  Right foot reveals moderate edema especially around the MTP joint with slight warmth. No definite erythema. Good distal foot pulses. No bony tenderness other than minimal tenderness MTP joint. No calf tenderness          Assessment & Plan:   #1 right foot pain and edema. Most of his pain is centered around the metatarsophalangeal joint. Suspect acute gout. Because of inflammatory changes suspect more likely gout than osteoarthritis. Prednisone 20 mg 2 tablets daily for 5 days. Reassess in one week. Consider x-rays then if not improved   #2 elevated blood pressure. No prior history of hypertension. Watch sodium intake. Monitor closely at home over the next week and bring in readings in one week to review

## 2015-10-24 NOTE — Patient Instructions (Signed)
Monitor blood pressure at home in bring in readings for review in one week.    Gout Gout is an inflammatory arthritis caused by a buildup of uric acid crystals in the joints. Uric acid is a chemical that is normally present in the blood. When the level of uric acid in the blood is too high it can form crystals that deposit in your joints and tissues. This causes joint redness, soreness, and swelling (inflammation). Repeat attacks are common. Over time, uric acid crystals can form into masses (tophi) near a joint, destroying bone and causing disfigurement. Gout is treatable and often preventable. CAUSES  The disease begins with elevated levels of uric acid in the blood. Uric acid is produced by your body when it breaks down a naturally found substance called purines. Certain foods you eat, such as meats and fish, contain high amounts of purines. Causes of an elevated uric acid level include:  Being passed down from parent to child (heredity).  Diseases that cause increased uric acid production (such as obesity, psoriasis, and certain cancers).  Excessive alcohol use.  Diet, especially diets rich in meat and seafood.  Medicines, including certain cancer-fighting medicines (chemotherapy), water pills (diuretics), and aspirin.  Chronic kidney disease. The kidneys are no longer able to remove uric acid well.  Problems with metabolism. Conditions strongly associated with gout include:  Obesity.  High blood pressure.  High cholesterol.  Diabetes. Not everyone with elevated uric acid levels gets gout. It is not understood why some people get gout and others do not. Surgery, joint injury, and eating too much of certain foods are some of the factors that can lead to gout attacks. SYMPTOMS   An attack of gout comes on quickly. It causes intense pain with redness, swelling, and warmth in a joint.  Fever can occur.  Often, only one joint is involved. Certain joints are more commonly  involved:  Base of the big toe.  Knee.  Ankle.  Wrist.  Finger. Without treatment, an attack usually goes away in a few days to weeks. Between attacks, you usually will not have symptoms, which is different from many other forms of arthritis. DIAGNOSIS  Your caregiver will suspect gout based on your symptoms and exam. In some cases, tests may be recommended. The tests may include:  Blood tests.  Urine tests.  X-rays.  Joint fluid exam. This exam requires a needle to remove fluid from the joint (arthrocentesis). Using a microscope, gout is confirmed when uric acid crystals are seen in the joint fluid. TREATMENT  There are two phases to gout treatment: treating the sudden onset (acute) attack and preventing attacks (prophylaxis).  Treatment of an Acute Attack.  Medicines are used. These include anti-inflammatory medicines or steroid medicines.  An injection of steroid medicine into the affected joint is sometimes necessary.  The painful joint is rested. Movement can worsen the arthritis.  You may use warm or cold treatments on painful joints, depending which works best for you.  Treatment to Prevent Attacks.  If you suffer from frequent gout attacks, your caregiver may advise preventive medicine. These medicines are started after the acute attack subsides. These medicines either help your kidneys eliminate uric acid from your body or decrease your uric acid production. You may need to stay on these medicines for a very long time.  The early phase of treatment with preventive medicine can be associated with an increase in acute gout attacks. For this reason, during the first few months of treatment, your  caregiver may also advise you to take medicines usually used for acute gout treatment. Be sure you understand your caregiver's directions. Your caregiver may make several adjustments to your medicine dose before these medicines are effective.  Discuss dietary treatment with your  caregiver or dietitian. Alcohol and drinks high in sugar and fructose and foods such as meat, poultry, and seafood can increase uric acid levels. Your caregiver or dietitian can advise you on drinks and foods that should be limited. HOME CARE INSTRUCTIONS   Do not take aspirin to relieve pain. This raises uric acid levels.  Only take over-the-counter or prescription medicines for pain, discomfort, or fever as directed by your caregiver.  Rest the joint as much as possible. When in bed, keep sheets and blankets off painful areas.  Keep the affected joint raised (elevated).  Apply warm or cold treatments to painful joints. Use of warm or cold treatments depends on which works best for you.  Use crutches if the painful joint is in your leg.  Drink enough fluids to keep your urine clear or pale yellow. This helps your body get rid of uric acid. Limit alcohol, sugary drinks, and fructose drinks.  Follow your dietary instructions. Pay careful attention to the amount of protein you eat. Your daily diet should emphasize fruits, vegetables, whole grains, and fat-free or low-fat milk products. Discuss the use of coffee, vitamin C, and cherries with your caregiver or dietitian. These may be helpful in lowering uric acid levels.  Maintain a healthy body weight. SEEK MEDICAL CARE IF:   You develop diarrhea, vomiting, or any side effects from medicines.  You do not feel better in 24 hours, or you are getting worse. SEEK IMMEDIATE MEDICAL CARE IF:   Your joint becomes suddenly more tender, and you have chills or a fever. MAKE SURE YOU:   Understand these instructions.  Will watch your condition.  Will get help right away if you are not doing well or get worse.   This information is not intended to replace advice given to you by your health care provider. Make sure you discuss any questions you have with your health care provider.   Document Released: 07/25/2000 Document Revised: 08/18/2014  Document Reviewed: 03/10/2012 Elsevier Interactive Patient Education [employer].

## 2015-10-24 NOTE — Progress Notes (Signed)
Pre visit review using our clinic review tool, if applicable. No additional management support is needed unless otherwise documented below in the visit note. 

## 2015-10-24 NOTE — Telephone Encounter (Signed)
Pt scheduled today at 3:15 PM with Dr. Elease Hashimoto.  PLEASE NOTE: All timestamps contained within this report are represented as [place] Standard Time. CONFIDENTIALTY NOTICE: This fax transmission is intended only for the addressee. It contains information that is legally privileged, confidential or otherwise protected from use or disclosure. If you are not the intended recipient, you are strictly prohibited from reviewing, disclosing, copying using or disseminating any of this information or taking any action in reliance on or regarding this information. If you have received this fax in error, please notify us immediately by telephone so that we can arrange for its return to [place]. Phone: [phone], Toll-Free: [phone], Fax: [phone] Page: 1 of 1 Call Id: [id number] [facility] - Client Nonclinical Telephone Record [facility] - Client Client Site [facility] - Night Contact Type Call Who Is Calling Patient / Member / Family / Caregiver Caller Name [place] Phone Number [phone] Patient Name Laurel Dimmer Call Type Message Only Information Provided Reason for Call Request for General Office Information Initial Comment Caller states received a call in regards to an appt but it is the wrong date. Additional Comment Call Closed By: Delton Coombes Transaction Date/Time: 10/23/2015 5:34:35 PM (ET)

## 2015-10-31 ENCOUNTER — Ambulatory Visit (INDEPENDENT_AMBULATORY_CARE_PROVIDER_SITE_OTHER): Payer: Medicare Other | Admitting: Family Medicine

## 2015-10-31 ENCOUNTER — Encounter: Payer: Self-pay | Admitting: Family Medicine

## 2015-10-31 VITALS — BP 160/102 | HR 94 | Temp 98.5°F | Ht 66.0 in | Wt 210.0 lb

## 2015-10-31 DIAGNOSIS — M109 Gout, unspecified: Secondary | ICD-10-CM

## 2015-10-31 DIAGNOSIS — M10071 Idiopathic gout, right ankle and foot: Secondary | ICD-10-CM | POA: Diagnosis not present

## 2015-10-31 DIAGNOSIS — I10 Essential (primary) hypertension: Secondary | ICD-10-CM

## 2015-10-31 MED ORDER — AMLODIPINE BESYLATE 5 MG PO TABS: 5.0000 mg | ORAL_TABLET | Freq: Every day | ORAL | Status: DC

## 2015-10-31 NOTE — Patient Instructions (Signed)
DASH Eating Plan  DASH stands for "Dietary Approaches to Stop Hypertension." The DASH eating plan is a healthy eating plan that has been shown to reduce high blood pressure (hypertension). Additional health benefits may include reducing the risk of type 2 diabetes mellitus, heart disease, and stroke. The DASH eating plan may also help with weight loss.  WHAT DO I NEED TO KNOW ABOUT THE DASH EATING PLAN?  For the DASH eating plan, you will follow these general guidelines:  · Choose foods with a percent daily value for sodium of less than 5% (as listed on the food label).  · Use salt-free seasonings or herbs instead of table salt or sea salt.  · Check with your health care provider or pharmacist before using salt substitutes.  · Eat lower-sodium products, often labeled as "lower sodium" or "no salt added."  · Eat fresh foods.  · Eat more vegetables, fruits, and low-fat dairy products.  · Choose whole grains. Look for the word "whole" as the first word in the ingredient list.  · Choose fish and skinless chicken or turkey more often than red meat. Limit fish, poultry, and meat to 6 oz (170 g) each day.  · Limit sweets, desserts, sugars, and sugary drinks.  · Choose heart-healthy fats.  · Limit cheese to 1 oz (28 g) per day.  · Eat more home-cooked food and less restaurant, buffet, and fast food.  · Limit fried foods.  · Cook foods using methods other than frying.  · Limit canned vegetables. If you do use them, rinse them well to decrease the sodium.  · When eating at a restaurant, ask that your food be prepared with less salt, or no salt if possible.  WHAT FOODS CAN I EAT?  Seek help from a dietitian for individual calorie needs.  Grains  Whole grain or whole wheat bread. Brown rice. Whole grain or whole wheat pasta. Quinoa, bulgur, and whole grain cereals. Low-sodium cereals. Corn or whole wheat flour tortillas. Whole grain cornbread. Whole grain crackers. Low-sodium crackers.  Vegetables  Fresh or frozen vegetables  (raw, steamed, roasted, or grilled). Low-sodium or reduced-sodium tomato and vegetable juices. Low-sodium or reduced-sodium tomato sauce and paste. Low-sodium or reduced-sodium canned vegetables.   Fruits  All fresh, canned (in natural juice), or frozen fruits.  Meat and Other Protein Products  Ground beef (85% or leaner), grass-fed beef, or beef trimmed of fat. Skinless chicken or turkey. Ground chicken or turkey. Pork trimmed of fat. All fish and seafood. Eggs. Dried beans, peas, or lentils. Unsalted nuts and seeds. Unsalted canned beans.  Dairy  Low-fat dairy products, such as skim or 1% milk, 2% or reduced-fat cheeses, low-fat ricotta or cottage cheese, or plain low-fat yogurt. Low-sodium or reduced-sodium cheeses.  Fats and Oils  Tub margarines without trans fats. Light or reduced-fat mayonnaise and salad dressings (reduced sodium). Avocado. Safflower, olive, or canola oils. Natural peanut or almond butter.  Other  Unsalted popcorn and pretzels.  The items listed above may not be a complete list of recommended foods or beverages. Contact your dietitian for more options.  WHAT FOODS ARE NOT RECOMMENDED?  Grains  White bread. White pasta. White rice. Refined cornbread. Bagels and croissants. Crackers that contain trans fat.  Vegetables  Creamed or fried vegetables. Vegetables in a cheese sauce. Regular canned vegetables. Regular canned tomato sauce and paste. Regular tomato and vegetable juices.  Fruits  Dried fruits. Canned fruit in light or heavy syrup. Fruit juice.  Meat and Other Protein   Products  Fatty cuts of meat. Ribs, chicken wings, bacon, sausage, bologna, salami, chitterlings, fatback, hot dogs, bratwurst, and packaged luncheon meats. Salted nuts and seeds. Canned beans with salt.  Dairy  Whole or 2% milk, cream, half-and-half, and cream cheese. Whole-fat or sweetened yogurt. Full-fat cheeses or blue cheese. Nondairy creamers and whipped toppings. Processed cheese, cheese spreads, or cheese  curds.  Condiments  Onion and garlic salt, seasoned salt, table salt, and sea salt. Canned and packaged gravies. Worcestershire sauce. Tartar sauce. Barbecue sauce. Teriyaki sauce. Soy sauce, including reduced sodium. Steak sauce. Fish sauce. Oyster sauce. Cocktail sauce. Horseradish. Ketchup and mustard. Meat flavorings and tenderizers. Bouillon cubes. Hot sauce. Tabasco sauce. Marinades. Taco seasonings. Relishes.  Fats and Oils  Butter, stick margarine, lard, shortening, ghee, and bacon fat. Coconut, palm kernel, or palm oils. Regular salad dressings.  Other  Pickles and olives. Salted popcorn and pretzels.  The items listed above may not be a complete list of foods and beverages to avoid. Contact your dietitian for more information.  WHERE CAN I FIND MORE INFORMATION?  National Heart, Lung, [employer] Institute: www.nhlbi.nih.gov/health/health-topics/topics/dash/     This information is not intended to replace advice given to you by your health care provider. Make sure you discuss any questions you have with your health care provider.     Document Released: 07/17/2011 Document Revised: 08/18/2014 Document Reviewed: 06/01/2013  Elsevier Interactive Patient Education ©2016 Elsevier Inc.

## 2015-10-31 NOTE — Progress Notes (Signed)
   Subjective:    Patient ID: Ethan Anderson, male    DOB: [birth year], 81 y.o.   MRN: [id number]  HPI   patient seen for the following   Recent right foot pain. He had pain metatarsophalangeal joint. We suspected acute gout. No recent injury. No evidence for infection. He was placed on prednisone for 5 days and symptoms quickly resolved.  He has no pain with ambulation whatsoever at this time. No recent dietary changes   Recent elevated blood pressure. No history of hypertension.  Wife is been monitoring blood pressures closely with manual cuff and consistently getting around 123456 systolic and 0000000 to 123XX123 diastolic.  He's been more sedentary and less exercise recently. Has gained some weight.  No alcohol use. Nonsmoker. Eats out fairly often but does not add a lot of salt at home. No recent headaches. No peripheral edema. No chest pains.  Past Medical History  Diagnosis Date  . Depression   . Macular degeneration    Past Surgical History  Procedure Laterality Date  . Colonoscopy    . Polypectomy      reports that he quit smoking about 25 years ago. His smoking use included Cigarettes. He has a 10 pack-year smoking history. He has never used smokeless tobacco. He reports that he does not drink alcohol or use illicit drugs. family history includes Alcohol abuse in his cousin; Ovarian cancer in his mother; Stomach cancer in his mother. There is no history of Colon cancer, Esophageal cancer, or Rectal cancer. No Known Allergies   Review of Systems  Constitutional: Negative for fatigue.  Eyes: Negative for visual disturbance.  Respiratory: Negative for cough, chest tightness and shortness of breath.   Cardiovascular: Negative for chest pain, palpitations and leg swelling.  Gastrointestinal: Negative for abdominal pain.  Endocrine: Negative for polydipsia and polyuria.  Musculoskeletal: Negative for arthralgias.  Neurological: Negative for dizziness, syncope, weakness, light-headedness  and headaches.       Objective:   Physical Exam  Constitutional: He appears well-developed and well-nourished.  Neck: Neck supple.  Cardiovascular: Normal rate and regular rhythm.   Pulmonary/Chest: Effort normal and breath sounds normal. No respiratory distress. He has no wheezes. He has no rales.  Musculoskeletal: He exhibits no edema.  Right foot examined. No edema. Nontender. No warmth. Good distal pulses.  Lymphadenopathy:    He has no cervical adenopathy.          Assessment & Plan:   #1 hypertension. Currently untreated. He's had multiple elevated readings recently including multiple ones at home and here which confirmed blood pressure around 123456 systolic and 123XX123 diastolic. Start amlodipine 5 mg daily. DASH diet given. Lose some weight. Walking for exercise. Reassess one month. Avoid HCTZ secondary to probable gout history.   #2 recent right foot pain. Suspected acute gout right metatarsophalangeal joint. Promptly resolved with prednisone. We discussed dietary prevention. Consider prophylaxis if more frequent outbreaks.

## 2015-10-31 NOTE — Progress Notes (Signed)
Pre visit review using our clinic review tool, if applicable. No additional management support is needed unless otherwise documented below in the visit note. 

## 2015-11-30 ENCOUNTER — Ambulatory Visit: Payer: Medicare Other | Admitting: Family Medicine

## 2015-12-03 ENCOUNTER — Encounter: Payer: Self-pay | Admitting: Family Medicine

## 2015-12-03 ENCOUNTER — Ambulatory Visit (INDEPENDENT_AMBULATORY_CARE_PROVIDER_SITE_OTHER): Payer: Medicare Other | Admitting: Family Medicine

## 2015-12-03 VITALS — BP 148/80 | HR 94 | Temp 98.5°F | Ht 66.0 in | Wt 208.7 lb

## 2015-12-03 DIAGNOSIS — I10 Essential (primary) hypertension: Secondary | ICD-10-CM

## 2015-12-03 NOTE — Patient Instructions (Signed)
Monitor blood pressure and be in touch if consistently > 150/90 

## 2015-12-03 NOTE — Progress Notes (Signed)
Pre visit review using our clinic tool,if applicable. No additional management support is needed unless otherwise documented below in the visit note.  

## 2015-12-03 NOTE — Progress Notes (Signed)
   Subjective:    Patient ID: Ethan Anderson, male    DOB: April 06, 1935, 81 y.o.   MRN: [id number]  HPI Patient seen for follow-up hypertension. Refer to previous note. He came in with blood pressure 123456 systolic and 123XX123 diastolic. Started amlodipine 5 mg daily Tolerating well no side effects. Not monitoring at home. No headaches or peripheral edema. Does not exercise. Poor compliance with snack foods.  Past Medical History  Diagnosis Date  . Depression   . Macular degeneration    Past Surgical History  Procedure Laterality Date  . Colonoscopy    . Polypectomy      reports that he quit smoking about 25 years ago. His smoking use included Cigarettes. He has a 10 pack-year smoking history. He has never used smokeless tobacco. He reports that he does not drink alcohol or use illicit drugs. family history includes Alcohol abuse in his cousin; Ovarian cancer in his mother; Stomach cancer in his mother. There is no history of Colon cancer, Esophageal cancer, or Rectal cancer. No Known Allergies    Review of Systems  Constitutional: Negative for fatigue.  Eyes: Negative for visual disturbance.  Respiratory: Negative for cough, chest tightness and shortness of breath.   Cardiovascular: Negative for chest pain, palpitations and leg swelling.  Neurological: Negative for dizziness, syncope, weakness, light-headedness and headaches.       Objective:   Physical Exam  Constitutional: He is oriented to person, place, and time. He appears well-developed and well-nourished.  HENT:  Right Ear: External ear normal.  Left Ear: External ear normal.  Mouth/Throat: Oropharynx is clear and moist.  Eyes: Pupils are equal, round, and reactive to light.  Neck: Neck supple. No thyromegaly present.  Cardiovascular: Normal rate and regular rhythm.   Pulmonary/Chest: Effort normal and breath sounds normal. No respiratory distress. He has no wheezes. He has no rales.  Musculoskeletal: He exhibits no  edema.  Neurological: He is alert and oriented to person, place, and time.          Assessment & Plan:  Hypertension. Improved. Repeat reading left arm seated after rest 148/80. Continue current therapy. He is encouraged to get out and walk more and lose some weight and reduce sodium intake. Reassess 6 months.  Eulas Post MD [facility]

## 2016-02-08 ENCOUNTER — Encounter: Payer: Medicare Other | Admitting: Family Medicine

## 2016-03-26 ENCOUNTER — Ambulatory Visit (INDEPENDENT_AMBULATORY_CARE_PROVIDER_SITE_OTHER): Payer: Medicare Other | Admitting: Ophthalmology

## 2016-03-26 DIAGNOSIS — H43813 Vitreous degeneration, bilateral: Secondary | ICD-10-CM

## 2016-03-26 DIAGNOSIS — H35033 Hypertensive retinopathy, bilateral: Secondary | ICD-10-CM | POA: Diagnosis not present

## 2016-03-26 DIAGNOSIS — H353112 Nonexudative age-related macular degeneration, right eye, intermediate dry stage: Secondary | ICD-10-CM

## 2016-03-26 DIAGNOSIS — H2511 Age-related nuclear cataract, right eye: Secondary | ICD-10-CM

## 2016-03-26 DIAGNOSIS — I10 Essential (primary) hypertension: Secondary | ICD-10-CM | POA: Diagnosis not present

## 2016-03-26 DIAGNOSIS — H348122 Central retinal vein occlusion, left eye, stable: Secondary | ICD-10-CM | POA: Diagnosis not present

## 2016-05-26 ENCOUNTER — Ambulatory Visit (INDEPENDENT_AMBULATORY_CARE_PROVIDER_SITE_OTHER): Payer: Medicare Other | Admitting: Family Medicine

## 2016-05-26 ENCOUNTER — Encounter: Payer: Self-pay | Admitting: Family Medicine

## 2016-05-26 VITALS — BP 140/80 | HR 96 | Temp 98.5°F | Ht 66.0 in | Wt 208.8 lb

## 2016-05-26 DIAGNOSIS — I10 Essential (primary) hypertension: Secondary | ICD-10-CM

## 2016-05-26 DIAGNOSIS — Z23 Encounter for immunization: Secondary | ICD-10-CM | POA: Diagnosis not present

## 2016-05-26 NOTE — Progress Notes (Signed)
Subjective:     Patient ID: Ethan Anderson, male   DOB: [birth year], 81 y.o.   MRN: [id number]  HPI Follow-up hypertension. Patient takes amlodipine 5 mg daily. Wife states he consumes excessive sugar especially with coffee. Consuming more coffee lately. Very little activity. They've encouraged him to walk but he rarely is active physically. History of gout but no recent flareups. No chest pains. No dyspnea.  Past Medical History:  Diagnosis Date  . Depression   . Macular degeneration    Past Surgical History:  Procedure Laterality Date  . COLONOSCOPY    . POLYPECTOMY      reports that he quit smoking about 25 years ago. His smoking use included Cigarettes. He has a 10.00 pack-year smoking history. He has never used smokeless tobacco. He reports that he does not drink alcohol or use drugs. family history includes Alcohol abuse in his cousin; Ovarian cancer in his mother; Stomach cancer in his mother. No Known Allergies   Review of Systems  Constitutional: Negative for fatigue.  Eyes: Negative for visual disturbance.  Respiratory: Negative for cough, chest tightness and shortness of breath.   Cardiovascular: Negative for chest pain, palpitations and leg swelling.  Neurological: Negative for dizziness, syncope, weakness, light-headedness and headaches.       Objective:   Physical Exam  Constitutional: He is oriented to person, place, and time. He appears well-developed and well-nourished.  HENT:  Right Ear: External ear normal.  Left Ear: External ear normal.  Mouth/Throat: Oropharynx is clear and moist.  Eyes: Pupils are equal, round, and reactive to light.  Neck: Neck supple. No thyromegaly present.  Cardiovascular: Normal rate and regular rhythm.   Pulmonary/Chest: Effort normal and breath sounds normal. No respiratory distress. He has no wheezes. He has no rales.  Musculoskeletal: He exhibits no edema.  Neurological: He is alert and oriented to person, place, and time.      Assessment:     Hypertension. Fair control    Plan:     -Recommend weight loss -Recommend more consistent physical activity with walking -Flu vaccine given -Routine follow-up 6 months and consider well visit at that point  Eulas Post MD [facility]

## 2016-05-26 NOTE — Progress Notes (Signed)
Pre visit review using our clinic review tool, if applicable. No additional management support is needed unless otherwise documented below in the visit note. 

## 2016-05-26 NOTE — Patient Instructions (Signed)
Set up physical exam for follow up in 6 months.

## 2016-06-02 ENCOUNTER — Ambulatory Visit: Payer: Medicare Other | Admitting: Family Medicine

## 2016-09-26 ENCOUNTER — Other Ambulatory Visit: Payer: Self-pay | Admitting: Family Medicine

## 2016-11-24 ENCOUNTER — Ambulatory Visit: Payer: Medicare Other | Admitting: Family Medicine

## 2016-11-24 ENCOUNTER — Ambulatory Visit (INDEPENDENT_AMBULATORY_CARE_PROVIDER_SITE_OTHER): Payer: Medicare Other | Admitting: Family Medicine

## 2016-11-24 ENCOUNTER — Telehealth: Payer: Self-pay | Admitting: Family Medicine

## 2016-11-24 ENCOUNTER — Encounter: Payer: Self-pay | Admitting: Family Medicine

## 2016-11-24 VITALS — BP 142/90 | HR 86 | Temp 97.9°F | Ht 68.0 in | Wt 209.3 lb

## 2016-11-24 DIAGNOSIS — Z0000 Encounter for general adult medical examination without abnormal findings: Secondary | ICD-10-CM | POA: Diagnosis not present

## 2016-11-24 LAB — CBC WITH DIFFERENTIAL/PLATELET
Basophils Absolute: 0 10*3/uL (ref 0.0–0.1)
Basophils Relative: 0.5 % (ref 0.0–3.0)
Eosinophils Absolute: 0.1 10*3/uL (ref 0.0–0.7)
Eosinophils Relative: 2.1 % (ref 0.0–5.0)
HCT: 43.4 % (ref 39.0–52.0)
Hemoglobin: 14.4 g/dL (ref 13.0–17.0)
LYMPHS ABS: 2.5 10*3/uL (ref 0.7–4.0)
Lymphocytes Relative: 39 % (ref 12.0–46.0)
MCHC: 33.2 g/dL (ref 30.0–36.0)
MCV: 85.5 fl (ref 78.0–100.0)
MONOS PCT: 10.3 % (ref 3.0–12.0)
Monocytes Absolute: 0.6 10*3/uL (ref 0.1–1.0)
NEUTROS PCT: 48.1 % (ref 43.0–77.0)
Neutro Abs: 3 10*3/uL (ref 1.4–7.7)
Platelets: 259 10*3/uL (ref 150.0–400.0)
RBC: 5.08 Mil/uL (ref 4.22–5.81)
RDW: 14.6 % (ref 11.5–15.5)
WBC: 6.3 10*3/uL (ref 4.0–10.5)

## 2016-11-24 LAB — BASIC METABOLIC PANEL
BUN: 12 mg/dL (ref 6–23)
CALCIUM: 9.4 mg/dL (ref 8.4–10.5)
CO2: 30 mEq/L (ref 19–32)
Chloride: 103 mEq/L (ref 96–112)
Creatinine, Ser: 0.98 mg/dL (ref 0.40–1.50)
GFR: 94.72 mL/min (ref >60.00)
Glucose, Bld: 96 mg/dL (ref 70–99)
Potassium: 3.8 mEq/L (ref 3.5–5.1)
Sodium: 140 mEq/L (ref 135–145)

## 2016-11-24 LAB — HEPATIC FUNCTION PANEL
ALT: 17 U/L (ref 0–53)
AST: 16 U/L (ref 0–37)
Albumin: 4.6 g/dL (ref 3.5–5.2)
Alkaline Phosphatase: 90 U/L (ref 39–117)
Bilirubin, Direct: 0.1 mg/dL (ref 0.0–0.3)
Total Bilirubin: 0.6 mg/dL (ref 0.2–1.2)
Total Protein: 7.3 g/dL (ref 6.0–8.3)

## 2016-11-24 LAB — LIPID PANEL
Cholesterol: 215 mg/dL — ABNORMAL HIGH (ref 0–200)
HDL: 51.5 mg/dL (ref >39.00)
LDL Cholesterol: 137 mg/dL — ABNORMAL HIGH (ref 0–99)
NonHDL: 163.17
Total CHOL/HDL Ratio: 4
Triglycerides: 130 mg/dL (ref 0.0–149.0)
VLDL: 26 mg/dL (ref 0.0–40.0)

## 2016-11-24 LAB — TSH: TSH: 4.43 u[IU]/mL (ref 0.35–4.50)

## 2016-11-24 NOTE — Patient Instructions (Addendum)
Check on coverage for Shingrix vaccine- if interested. Check with GI regarding colonoscopy- but appears would be due next year. Try to get more active with walking. Monitor blood pressure and be in touch if consistently > 140/90.

## 2016-11-24 NOTE — Telephone Encounter (Signed)
noted 

## 2016-11-24 NOTE — Progress Notes (Signed)
Subjective:     Patient ID: Ethan Anderson, male   DOB: [birth year], 82 y.o.   MRN: [id number]  HPI Patient here for physical exam. Hypertension treated with amlodipine. Takes no other prescription medications other than eyedrop. Is very sedentary. Appetite and weight have been stable. He had colonoscopy 2014 and there was consideration for three-year follow-up but pathology showed no polyp whatsoever- only benign tissue.  He has not had shingles vaccine. Otherwise, immunizations up-to-date. Nonsmoker. No alcohol use. No recent falls. No depression issues.  Past Medical History:  Diagnosis Date  . Depression   . Macular degeneration    Past Surgical History:  Procedure Laterality Date  . COLONOSCOPY    . POLYPECTOMY      reports that he quit smoking about 26 years ago. His smoking use included Cigarettes. He has a 10.00 pack-year smoking history. He has never used smokeless tobacco. He reports that he does not drink alcohol or use drugs. family history includes Alcohol abuse in his cousin; Ovarian cancer in his mother; Stomach cancer in his mother. No Known Allergies]   Review of Systems  Constitutional: Negative for activity change, appetite change, fatigue and fever.  HENT: Negative for congestion, ear pain and trouble swallowing.   Eyes: Negative for pain and visual disturbance.  Respiratory: Negative for cough, shortness of breath and wheezing.   Cardiovascular: Negative for chest pain and palpitations.  Gastrointestinal: Negative for abdominal distention, abdominal pain, blood in stool, constipation, diarrhea, nausea, rectal pain and vomiting.  Genitourinary: Negative for dysuria, hematuria and testicular pain.  Musculoskeletal: Negative for arthralgias and joint swelling.  Skin: Negative for rash.  Neurological: Negative for dizziness, syncope and headaches.  Hematological: Negative for adenopathy.  Psychiatric/Behavioral: Negative for confusion and dysphoric mood.        Objective:   Physical Exam  Constitutional: He is oriented to person, place, and time. He appears well-developed and well-nourished. No distress.  HENT:  Head: Normocephalic and atraumatic.  Right Ear: External ear normal.  Left Ear: External ear normal.  Mouth/Throat: Oropharynx is clear and moist.  Eyes: Conjunctivae and EOM are normal. Pupils are equal, round, and reactive to light.  Neck: Normal range of motion. Neck supple. No thyromegaly present.  Cardiovascular: Normal rate, regular rhythm and normal heart sounds.   No murmur heard. Pulmonary/Chest: No respiratory distress. He has no wheezes. He has no rales.  Abdominal: Soft. Bowel sounds are normal. He exhibits no distension and no mass. There is no tenderness. There is no rebound and no guarding.  Musculoskeletal: He exhibits no edema.  Lymphadenopathy:    He has no cervical adenopathy.  Neurological: He is alert and oriented to person, place, and time. He displays normal reflexes. No cranial nerve deficit.  Skin: No rash noted.  Psychiatric: He has a normal mood and affect.       Assessment:     Physical exam. Blood pressure borderline elevated but patient did not take his medication this morning. No history of shingles vaccine.    Plan:     -He will check on coverage for new shingles vaccine and let us know if interested -Establish more consistent aerobic exercise such as walking -Monitor blood pressure closely and be in touch if consistently greater than 140/90 -Obtain screening lab work -They will check with GI office regarding recommended interval for next colonoscopy. -He is encouraged to lose some weight  Eulas Post MD [facility] at [facility]

## 2016-11-24 NOTE — Telephone Encounter (Signed)
° ° ° °  Pt is not due to have another colonoscopy till 09/2017

## 2016-11-24 NOTE — Progress Notes (Signed)
Pre visit review using our clinic review tool, if applicable. No additional management support is needed unless otherwise documented below in the visit note. 

## 2017-03-26 ENCOUNTER — Ambulatory Visit (INDEPENDENT_AMBULATORY_CARE_PROVIDER_SITE_OTHER): Payer: Medicare Other | Admitting: Ophthalmology

## 2017-03-26 DIAGNOSIS — H43813 Vitreous degeneration, bilateral: Secondary | ICD-10-CM

## 2017-03-26 DIAGNOSIS — I10 Essential (primary) hypertension: Secondary | ICD-10-CM | POA: Diagnosis not present

## 2017-03-26 DIAGNOSIS — H353112 Nonexudative age-related macular degeneration, right eye, intermediate dry stage: Secondary | ICD-10-CM | POA: Diagnosis not present

## 2017-03-26 DIAGNOSIS — H348122 Central retinal vein occlusion, left eye, stable: Secondary | ICD-10-CM

## 2017-03-26 DIAGNOSIS — H35033 Hypertensive retinopathy, bilateral: Secondary | ICD-10-CM

## 2017-03-26 DIAGNOSIS — H2511 Age-related nuclear cataract, right eye: Secondary | ICD-10-CM

## 2017-04-27 ENCOUNTER — Other Ambulatory Visit: Payer: Self-pay | Admitting: Family Medicine

## 2017-09-23 ENCOUNTER — Encounter: Payer: Self-pay | Admitting: Gastroenterology

## 2017-09-25 ENCOUNTER — Encounter: Payer: Self-pay | Admitting: Physician Assistant

## 2017-10-05 ENCOUNTER — Ambulatory Visit (INDEPENDENT_AMBULATORY_CARE_PROVIDER_SITE_OTHER): Payer: Medicare Other | Admitting: Family Medicine

## 2017-10-05 ENCOUNTER — Encounter: Payer: Self-pay | Admitting: Family Medicine

## 2017-10-05 ENCOUNTER — Ambulatory Visit (INDEPENDENT_AMBULATORY_CARE_PROVIDER_SITE_OTHER)
Admission: RE | Admit: 2017-10-05 | Discharge: 2017-10-05 | Disposition: A | Discharge status: Home / Self Care | Payer: Medicare Other | Source: Ambulatory Visit | Attending: Family Medicine | Admitting: Family Medicine

## 2017-10-05 VITALS — BP 110/68 | HR 96 | Temp 98.2°F | Wt 209.7 lb

## 2017-10-05 DIAGNOSIS — H2011 Chronic iridocyclitis, right eye: Secondary | ICD-10-CM

## 2017-10-05 DIAGNOSIS — M545 Low back pain, unspecified: Secondary | ICD-10-CM

## 2017-10-05 NOTE — Patient Instructions (Signed)
Go for the x-rays at [facility] clinic

## 2017-10-05 NOTE — Progress Notes (Signed)
Subjective:     Patient ID: Ethan Anderson, male   DOB: [birth year], 83 y.o.   MRN: [id number]  HPI Patient is here to discuss some lab work and x-rays that were recommended by his ophthalmologist. He has history of left chronic uveitis and more recently had problems with right chronic uveitis. Ophthalmologist had recommended systemic evaluation looking for etiologies. Patient did mention that he had some recent low back pains. His pain started about a month ago. He noticed after doing some twisting. He denies any chronic low back pain. Certainly nothing to suggest ankylosing spondylitis history. No history of psoriasis or any other skin rashes.  Ophthalmologist had recommended HLA-B27, Ace level, chest x-ray, and lumbosacral back x-rays. He has not had any chronic cough or dyspnea. No fevers or chills. No skin rashes. No chronic back pain. He does have history of macular degeneration and has had cataracts previously.  Past Medical History:  Diagnosis Date  . Depression   . Macular degeneration    Past Surgical History:  Procedure Laterality Date  . COLONOSCOPY    . POLYPECTOMY      reports that he quit smoking about 27 years ago. His smoking use included cigarettes. He has a 10.00 pack-year smoking history. he has never used smokeless tobacco. He reports that he does not drink alcohol or use drugs. family history includes Alcohol abuse in his cousin; Ovarian cancer in his mother; Stomach cancer in his mother. No Known Allergies   Review of Systems  Constitutional: Negative for fatigue.  HENT: Negative for sore throat.   Eyes: Negative for visual disturbance.  Respiratory: Negative for cough, chest tightness and shortness of breath.   Cardiovascular: Negative for chest pain, palpitations and leg swelling.  Gastrointestinal: Negative for abdominal pain.  Genitourinary: Negative for dysuria.  Musculoskeletal: Positive for back pain.  Skin: Negative for rash.  Neurological: Negative for  dizziness, syncope, weakness, light-headedness and headaches.  Hematological: Negative for adenopathy.       Objective:   Physical Exam  Constitutional: He is oriented to person, place, and time. He appears well-developed and well-nourished.  HENT:  Right Ear: External ear normal.  Left Ear: External ear normal.  Mouth/Throat: Oropharynx is clear and moist.  Eyes: Pupils are equal, round, and reactive to light.  Neck: Neck supple. No thyromegaly present.  Cardiovascular: Normal rate and regular rhythm.  Pulmonary/Chest: Effort normal and breath sounds normal. No respiratory distress. He has no wheezes. He has no rales.  Musculoskeletal: He exhibits no edema.  Good range of motion cervical spine  Neurological: He is alert and oriented to person, place, and time. No cranial nerve deficit.  Skin: No rash noted.       Assessment:     #1 history of chronic uveitis-bilateral more recently mostly right-sided  #2 low clinical suspicion for sarcoidosis or HLA B 27 spondyloarthropathy other than his chronic uveitis symptoms  #3 low back pain- suspect musculoskeletal.     Plan:     -Obtain further labs with HLA-B27 and Ace level.  We did mention that these tests are very nonspecific and can have low specificity and sensitivity (these were requested by his ophthalmologist) -Obtain further x-rays with chest x-ray and lumbosacral spine films  Eulas Post MD [facility] at [facility]

## 2017-10-06 LAB — EXTRA LAV TOP TUBE

## 2017-10-06 LAB — ANGIOTENSIN CONVERTING ENZYME: Angiotensin-Converting Enzyme: 103 U/L — ABNORMAL HIGH (ref 9–67)

## 2017-10-06 LAB — HLA-B27 ANTIGEN: HLA-B27 Antigen: NEGATIVE

## 2017-10-08 ENCOUNTER — Encounter: Payer: Self-pay | Admitting: Family Medicine

## 2017-10-08 LAB — HM DIABETES EYE EXAM

## 2017-10-09 ENCOUNTER — Encounter: Payer: Self-pay | Admitting: Physician Assistant

## 2017-10-09 ENCOUNTER — Ambulatory Visit (INDEPENDENT_AMBULATORY_CARE_PROVIDER_SITE_OTHER): Payer: Medicare Other | Admitting: Physician Assistant

## 2017-10-09 VITALS — BP 136/80 | HR 100 | Ht 67.0 in | Wt 210.0 lb

## 2017-10-09 DIAGNOSIS — Z86010 Personal history of colonic polyps: Secondary | ICD-10-CM

## 2017-10-09 DIAGNOSIS — D126 Benign neoplasm of colon, unspecified: Secondary | ICD-10-CM

## 2017-10-09 MED ORDER — NA SULFATE-K SULFATE-MG SULF 17.5-3.13-1.6 GM/177ML PO SOLN: 1.0000 | ORAL | 0 refills | Status: DC

## 2017-10-09 NOTE — Patient Instructions (Signed)

## 2017-10-09 NOTE — Progress Notes (Signed)
Chief Complaint: History of colon polyps  HPI:    Ethan Anderson is an 83 year old African-American male with a past medical history as listed below, who was referred to me by Eulas Post, MD for a his history of colon polyps.      Patient was accepted by Dr. Loletha Carrow.  Last colonoscopy 09/21/12 by Dr. Deatra Ina due to his personal history of colon polyps a year prior.  Finding of one 2 mm flat polyp in the ascending colon and otherwise normal. Pathology rectal prolapse tissue.  Repeat recommended in 4 years.  Colonoscopy 10/07/11 with a sessile polyp in the cecum and 12 mm polyp in the ascending colon.  Pathology tubular adenoma and tubulovillous adenoma.    Today, presents to clinic with no GI complaints.  Aware of his history of colon polyps and need for repeat colonoscopy.  He is in very good health with no history of heart, pulmonary or other issues.    Denies fever, chills, blood in stool, melena, change in bowel habits, GERD, nausea, vomiting, weight loss or abdominal pain.  Past Medical History:  Diagnosis Date  . Arthritis   . Colon polyps   . Depression   . Glaucoma   . HTN (hypertension)   . Macular degeneration     Past Surgical History:  Procedure Laterality Date  . CATARACT EXTRACTION Right   . COLONOSCOPY    . POLYPECTOMY      Current Outpatient Medications  Medication Sig Dispense Refill  . amLODipine (NORVASC) 5 MG tablet TAKE ONE (1) TABLET EACH DAY 90 tablet 1  . ketorolac (ACULAR) 0.5 % ophthalmic solution     . LUMIGAN 0.01 % SOLN Place 1 drop into the right eye at bedtime.     . Multiple Vitamins-Minerals (EYE VITAMINS PO) Take 1 capsule by mouth daily.     No current facility-administered medications for this visit.     Allergies as of 10/09/2017  . (No Known Allergies)    Family History  Problem Relation Age of Onset  . Ovarian cancer Mother   . Stomach cancer Mother   . Dementia Father   . Alcohol abuse Cousin   . Thyroid cancer Son   . Colon  cancer Neg Hx   . Esophageal cancer Neg Hx   . Rectal cancer Neg Hx     Social History   Socioeconomic History  . Marital status: Married    Spouse name: Not on file  . Number of children: 6  . Years of education: Not on file  . Highest education level: Not on file  Social Needs  . Financial resource strain: Not on file  . Food insecurity - worry: Not on file  . Food insecurity - inability: Not on file  . Transportation needs - medical: Not on file  . Transportation needs - non-medical: Not on file  Occupational History  . Occupation: retired  Tobacco Use  . Smoking status: Former Smoker    Packs/day: 1.00    Years: 10.00    Pack years: 10.00    Types: Cigarettes    Last attempt to quit: 08/18/1990    Years since quitting: 27.1  . Smokeless tobacco: Never Used  Substance and Sexual Activity  . Alcohol use: No  . Drug use: No  . Sexual activity: Not on file  Other Topics Concern  . Not on file  Social History Narrative  . Not on file    Review of Systems:    Constitutional: No  weight loss, fever or chills Skin: No rash Cardiovascular: No chest pain Respiratory: No SOB  Gastrointestinal: See HPI and otherwise negative Genitourinary: No dysuria Neurological: No headache, dizziness or syncope Musculoskeletal: No new muscle or joint pain Hematologic: No bleeding  Psychiatric: No history of depression or anxiety   Physical Exam:  Vital signs: BP 136/80 (BP Location: Left Arm, Patient Position: Sitting, Cuff Size: Normal)   Pulse 100   Ht 5\' 7"  (1.702 m) Comment: height measured without shoes  Wt 210 lb (95.3 kg)   BMI 32.89 kg/m   Constitutional:    Very Pleasant AA male appears to be in NAD, Well developed, Well nourished, alert and cooperative Head:  Normocephalic and atraumatic. Eyes:   PEERL, EOMI. No icterus. Conjunctiva pink. Ears:  Normal auditory acuity. Neck:  Supple Throat: Oral cavity and pharynx without inflammation, swelling or lesion.    Respiratory: Respirations even and unlabored. Lungs clear to auscultation bilaterally.   No wheezes, crackles, or rhonchi.  Cardiovascular: Normal S1, S2. No MRG. Regular rate and rhythm. No peripheral edema, cyanosis or pallor.  Gastrointestinal:  Soft, nondistended, nontender. No rebound or guarding. Normal bowel sounds. No appreciable masses or hepatomegaly. + diastasis recti hernia Rectal:  Not performed.  Msk:  Symmetrical without gross deformities. Without edema, no deformity or joint abnormality.  Neurologic:  Alert and  oriented x4;  grossly normal neurologically.  Skin:   Dry and intact without significant lesions or rashes. Psychiatric:  Demonstrates good judgement and reason without abnormal affect or behaviors.  RELEVANT LABS AND IMAGING: CBC    Component Value Date/Time   WBC 6.3 11/24/2016 1029   RBC 5.08 11/24/2016 1029   HGB 14.4 11/24/2016 1029   HCT 43.4 11/24/2016 1029   PLT 259.0 11/24/2016 1029   MCV 85.5 11/24/2016 1029   MCHC 33.2 11/24/2016 1029   RDW 14.6 11/24/2016 1029   LYMPHSABS 2.5 11/24/2016 1029   MONOABS 0.6 11/24/2016 1029   EOSABS 0.1 11/24/2016 1029   BASOSABS 0.0 11/24/2016 1029    CMP     Component Value Date/Time   NA 140 11/24/2016 1029   K 3.8 11/24/2016 1029   CL 103 11/24/2016 1029   CO2 30 11/24/2016 1029   GLUCOSE 96 11/24/2016 1029   BUN 12 11/24/2016 1029   CREATININE 0.98 11/24/2016 1029   CALCIUM 9.4 11/24/2016 1029   PROT 7.3 11/24/2016 1029   ALBUMIN 4.6 11/24/2016 1029   AST 16 11/24/2016 1029   ALT 17 11/24/2016 1029   ALKPHOS 90 11/24/2016 1029   BILITOT 0.6 11/24/2016 1029    Assessment: 1.  History of tubular adenoma: History of tubular adenoma and tubulovillous adenoma in 2013, repeat colonoscopy 2014 normal  Plan: 1.  Scheduled patient for a surveillance colonoscopy due to history of colon polyps with Dr. Loletha Carrow in the [facility].  Did discuss risk, benefits, limitations alternatives and the patient agrees to  proceed. 2.  Patient to return to clinic per recommendations from Dr. Loletha Carrow after time of procedure.  Ellouise Newer, PA-C [facility] 10/09/2017, 10:20 AM  Cc: Eulas Post, MD

## 2017-10-09 NOTE — Progress Notes (Signed)
Thank you for sending this case to me. I have reviewed the entire note, and the outlined plan seems appropriate.   Tywaun Hiltner Danis, MD  

## 2017-10-21 ENCOUNTER — Encounter: Payer: Self-pay | Admitting: Gastroenterology

## 2017-10-21 ENCOUNTER — Ambulatory Visit (AMBULATORY_SURGERY_CENTER): Payer: Medicare Other | Admitting: Gastroenterology

## 2017-10-21 ENCOUNTER — Other Ambulatory Visit: Payer: Self-pay

## 2017-10-21 VITALS — BP 112/73 | HR 95 | Temp 99.3°F | Resp 18 | Ht 67.0 in | Wt 210.0 lb

## 2017-10-21 DIAGNOSIS — D123 Benign neoplasm of transverse colon: Secondary | ICD-10-CM | POA: Diagnosis not present

## 2017-10-21 DIAGNOSIS — D128 Benign neoplasm of rectum: Secondary | ICD-10-CM | POA: Diagnosis not present

## 2017-10-21 DIAGNOSIS — Z860100 Personal history of colon polyps, unspecified: Secondary | ICD-10-CM

## 2017-10-21 DIAGNOSIS — D122 Benign neoplasm of ascending colon: Secondary | ICD-10-CM

## 2017-10-21 DIAGNOSIS — D120 Benign neoplasm of cecum: Secondary | ICD-10-CM

## 2017-10-21 MED ORDER — SODIUM CHLORIDE 0.9 % IV SOLN: 500.0000 mL | Freq: Once | INTRAVENOUS | Status: DC

## 2017-10-21 NOTE — Progress Notes (Signed)
Pt's wife Tamela Oddi, helped pt answer admitting questions.  Pt reported ate yesterday and this am, he said his wife knew when he ate last.  Tamela Oddi was brought back to admitting and she reported pt last ate solids yesterday am 08:30 and last drank liquids today 10:00.  Pt took his meds this am at 08:00.  Pt said he completed the suprep and he said only liquid coming out.  Reported light brown liquid last results, but then stated, "I do not look in the toilet".  He reported the light brown color from looking at the toilet tissue.  Lafe Garin, CRNA reported this to Dr. Loletha Carrow.  Will proceed with colonoscopy today. maw

## 2017-10-21 NOTE — Op Note (Signed)
[facility] Patient Name: Aarit Kashuba Procedure Date: 10/21/2017 2:11 PM MRN: [id number] Endoscopist: [place]. Loletha Carrow , MD Age: 83 Referring MD:  Date of Birth: Nov 01, 1934 Gender: Male Account #: [id number] Procedure:                Colonoscopy Indications:              Surveillance: Personal history of adenomatous                            polyps on last colonoscopy > 5 years ago (TA and >                            51mm TVA 09/2011) Medicines:                Monitored Anesthesia Care Procedure:                Pre-Anesthesia Assessment:                           - Prior to the procedure, a History and Physical                            was performed, and patient medications and                            allergies were reviewed. The patient's tolerance of                            previous anesthesia was also reviewed. The risks                            and benefits of the procedure and the sedation                            options and risks were discussed with the patient.                            All questions were answered, and informed consent                            was obtained. Anticoagulants: The patient has taken                            aspirin. It was decided not to withhold this                            medication prior to the procedure. ASA Grade                            Assessment: II - A patient with mild systemic                            disease. After reviewing the risks and benefits,  the patient was deemed in satisfactory condition to                            undergo the procedure.                           After obtaining informed consent, the colonoscope                            was passed under direct vision. Throughout the                            procedure, the patient's blood pressure, pulse, and                            oxygen saturations were monitored continuously. The   Colonoscope was introduced through the anus and                            advanced to the the cecum, identified by                            appendiceal orifice and ileocecal valve. The                            colonoscopy was performed without difficulty. The                            patient tolerated the procedure well. The quality                            of the bowel preparation was good. The ileocecal                            valve, appendiceal orifice, and rectum were                            photographed. The quality of the bowel preparation                            was evaluated using the BBPS [facility] Bowel                            Preparation Scale) with scores of: Right Colon = 2,                            Transverse Colon = 2 and Left Colon = 2. The total                            BBPS score equals 6. Scope In: 2:17:21 PM Scope Out: 2:38:44 PM Scope Withdrawal Time: 0 hours 18 minutes 21 seconds  Total Procedure Duration: 0 hours 21 minutes 23 seconds  Findings:  The perianal and digital rectal examinations were                            normal.                           A 12 mm polyp was found in the cecum. The polyp was                            multi-lobulated and semi-sessile. Polypectomy was                            attempted, initially using a piecemeal technique                            with a cold snare. Polyp resection was incomplete                            with this device. This intervention then required a                            different device and polypectomy technique. The                            remaining few edge pieces of the polyp were removed                            with a piecemeal technique using a cold biopsy                            forceps. Resection and retrieval were complete.                           Three sessile polyps were found in the distal                            transverse colon,  proximal ascending colon and                            distal ascending colon. The polyps were 5 to 10 mm                            in size. These polyps were removed with a cold                            snare. Resection and retrieval were complete.                           A 3 mm polyp was found in the rectum. The polyp was                            sessile. The polyp was removed with a piecemeal  technique using a cold biopsy forceps. Resection                            and retrieval were complete.                           Internal hemorrhoids were found. The hemorrhoids                            were large and Grade I (internal hemorrhoids that                            do not prolapse).                           The exam was otherwise without abnormality on                            direct and retroflexion views. Complications:            No immediate complications. Estimated Blood Loss:     Estimated blood loss was minimal. Impression:               - One 12 mm polyp in the cecum, removed piecemeal                            using a cold biopsy forceps. Resected and retrieved.                           - Three 5 to 10 mm polyps in the distal transverse                            colon, in the proximal ascending colon and in the                            distal ascending colon, removed with a cold snare.                            Resected and retrieved.                           - One 3 mm polyp in the rectum, removed piecemeal                            using a cold biopsy forceps. Resected and retrieved.                           - Internal hemorrhoids.                           - The examination was otherwise normal on direct                            and retroflexion views. Recommendation:           -  Patient has a contact number available for                            emergencies. The signs and symptoms of potential                             delayed complications were discussed with the                            patient. Return to normal activities tomorrow.                            Written discharge instructions were provided to the                            patient.                           - Resume previous diet.                           - Continue present medications.                           - Await pathology results.                           - No repeat routine screening/surveillance                            colonoscopy due to age. Henry L. Loletha Carrow, MD 10/21/2017 2:44:18 PM This report has been signed electronically.

## 2017-10-21 NOTE — Progress Notes (Signed)
Called to room to assist during endoscopic procedure.  Patient ID and intended procedure confirmed with present staff. Received instructions for my participation in the procedure from the performing physician.  

## 2017-10-21 NOTE — Progress Notes (Signed)
Report given to PACU, vss 

## 2017-10-21 NOTE — Patient Instructions (Signed)
YOU HAD AN ENDOSCOPIC PROCEDURE TODAY AT THE [facility]:   Refer to the procedure report that was given to you for any specific questions about what was found during the examination.  If the procedure report does not answer your questions, please call your gastroenterologist to clarify.  If you requested that your care partner not be given the details of your procedure findings, then the procedure report has been included in a sealed envelope for you to review at your convenience later.  YOU SHOULD EXPECT: Some feelings of bloating in the abdomen. Passage of more gas than usual.  Walking can help get rid of the air that was put into your GI tract during the procedure and reduce the bloating. If you had a lower endoscopy (such as a colonoscopy or flexible sigmoidoscopy) you may notice spotting of blood in your stool or on the toilet paper. If you underwent a bowel prep for your procedure, you may not have a normal bowel movement for a few days.  Please Note:  You might notice some irritation and congestion in your nose or some drainage.  This is from the oxygen used during your procedure.  There is no need for concern and it should clear up in a day or so.  SYMPTOMS TO REPORT IMMEDIATELY:   Following lower endoscopy (colonoscopy or flexible sigmoidoscopy):  Excessive amounts of blood in the stool  Significant tenderness or worsening of abdominal pains  Swelling of the abdomen that is new, acute  Fever of 100F or higher  Please see handouts given to you on Polyps and Hemorrhoids.  For urgent or emergent issues, a gastroenterologist can be reached at any hour by calling [phone].   DIET:  We do recommend a small meal at first, but then you may proceed to your regular diet.  Drink plenty of fluids but you should avoid alcoholic beverages for 24 hours.  ACTIVITY:  You should plan to take it easy for the rest of today and you should NOT DRIVE or use heavy machinery until tomorrow  (because of the sedation medicines used during the test).    FOLLOW UP: Our staff will call the number listed on your records the next business day following your procedure to check on you and address any questions or concerns that you may have regarding the information given to you following your procedure. If we do not reach you, we will leave a message.  However, if you are feeling well and you are not experiencing any problems, there is no need to return our call.  We will assume that you have returned to your regular daily activities without incident.  If any biopsies were taken you will be contacted by phone or by letter within the next 1-3 weeks.  Please call us at [phone] if you have not heard about the biopsies in 3 weeks.    SIGNATURES/CONFIDENTIALITY: You and/or your care partner have signed paperwork which will be entered into your electronic medical record.  These signatures attest to the fact that that the information above on your After Visit Summary has been reviewed and is understood.  Full responsibility of the confidentiality of this discharge information lies with you and/or your care-partner.  Thank you for letting us take care of your healthcare needs today. 

## 2017-10-22 ENCOUNTER — Telehealth: Payer: Self-pay | Admitting: *Deleted

## 2017-10-22 NOTE — Telephone Encounter (Signed)
  Follow up Call-  Call back number 10/21/2017  Post procedure Call Back phone  # [phone] hm  Permission to leave phone message Yes  Some recent data might be hidden     Patient questions:  Do you have a fever, pain , or abdominal swelling? No. Pain Score  0 *  Have you tolerated food without any problems? Yes.    Have you been able to return to your normal activities? Yes.    Do you have any questions about your discharge instructions: Diet   No. Medications  No. Follow up visit  No.  Do you have questions or concerns about your Care? No.  Actions: * If pain score is 4 or above: No action needed, pain <4.

## 2017-10-23 ENCOUNTER — Other Ambulatory Visit: Payer: Self-pay | Admitting: Family Medicine

## 2017-10-28 ENCOUNTER — Encounter: Payer: Self-pay | Admitting: Gastroenterology

## 2018-02-04 ENCOUNTER — Ambulatory Visit: Payer: Self-pay | Admitting: *Deleted

## 2018-02-04 NOTE — Telephone Encounter (Signed)
Pt's wife called with patient having swelling in both legs up to about 2 inches from his knees. Pt denies pain, difficulty breathing or fever.  Weight this morning is 199 lbs and at the last visit in the office he was 210 lbs. Hs taken his b/p med. Not checked b/p. Appointment scheduled for Monday per request. (per protocol, needs to be seen within 24 hours). Advised that if he starts having more symptoms (cardiac) to let us know and call 911 for difficulty breathing.  Home care advice given to wife with verbal understanding.  Will route to flow at [facility] at [place].  Reason for Disposition . [1] MODERATE leg swelling (e.g., swelling extends up to knees) AND [2] new onset or worsening  Answer Assessment - Initial Assessment Questions 1. ONSET: "When did the swelling start?" (e.g., minutes, hours, days)     Last night 2. LOCATION: "What part of the leg is swollen?"  "Are both legs swollen or just one leg?"     From knee to feet on both legs 3. SEVERITY: "How bad is the swelling?" (e.g., localized; mild, moderate, severe)  - Localized - small area of swelling localized to one leg  - MILD pedal edema - swelling limited to foot and ankle, pitting edema < 1/4 inch (6 mm) deep, rest and elevation eliminate most or all swelling  - MODERATE edema - swelling of lower leg to knee, pitting edema > 1/4 inch (6 mm) deep, rest and elevation only partially reduce swelling  - SEVERE edema - swelling extends above knee, facial or hand swelling present      moderate 4. REDNESS: "Does the swelling look red or infected?"     no 5. PAIN: "Is the swelling painful to touch?" If so, ask: "How painful is it?"   (Scale 1-10; mild, moderate or severe)     No pain 6. FEVER: "Do you have a fever?" If so, ask: "What is it, how was it measured, and when did it start?"      no 7. CAUSE: "What do you think is causing the leg swelling?"     Not sure 8. MEDICAL HISTORY: "Do you have a history of heart failure, kidney  disease, liver failure, or cancer?"     no 9. RECURRENT SYMPTOM: "Have you had leg swelling before?" If so, ask: "When was the last time?" "What happened that time?"     No not had this before  10. OTHER SYMPTOMS: "Do you have any other symptoms?" (e.g., chest pain, difficulty breathing)       no  Protocols used: LEG SWELLING AND EDEMA-A-AH

## 2018-02-04 NOTE — Telephone Encounter (Signed)
Noted Dr Elease Hashimoto not in office today.  Our Phones are currently down so we are unable to reach out to the patient to schedule sooner appt  Per Nicki Reaper, RN at [facility](in skype conversation), the patient did not want to come in this week, they requested Monday. Patient was advised that they needed to be seen sooner than Monday but they still requested Monday.  Pt was advised to go to [facility] if symptoms worsened over the next few days.   Will send to Dr Elease Hashimoto as Juluis Rainier.

## 2018-02-08 ENCOUNTER — Encounter: Payer: Self-pay | Admitting: Family Medicine

## 2018-02-08 ENCOUNTER — Ambulatory Visit (INDEPENDENT_AMBULATORY_CARE_PROVIDER_SITE_OTHER): Payer: Medicare Other | Admitting: Family Medicine

## 2018-02-08 VITALS — BP 130/78 | HR 88 | Temp 98.1°F | Wt 207.3 lb

## 2018-02-08 DIAGNOSIS — I10 Essential (primary) hypertension: Secondary | ICD-10-CM | POA: Diagnosis not present

## 2018-02-08 DIAGNOSIS — R748 Abnormal levels of other serum enzymes: Secondary | ICD-10-CM

## 2018-02-08 DIAGNOSIS — R600 Localized edema: Secondary | ICD-10-CM

## 2018-02-08 DIAGNOSIS — R740 Nonspecific elevation of levels of transaminase and lactic acid dehydrogenase [LDH]: Secondary | ICD-10-CM | POA: Diagnosis not present

## 2018-02-08 DIAGNOSIS — R7401 Elevation of levels of liver transaminase levels: Secondary | ICD-10-CM

## 2018-02-08 DIAGNOSIS — H2011 Chronic iridocyclitis, right eye: Secondary | ICD-10-CM

## 2018-02-08 LAB — BASIC METABOLIC PANEL
BUN: 15 mg/dL (ref 6–23)
CO2: 27 mEq/L (ref 19–32)
CREATININE: 1.04 mg/dL (ref 0.40–1.50)
Calcium: 8.9 mg/dL (ref 8.4–10.5)
Chloride: 103 mEq/L (ref 96–112)
GFR: 88.17 mL/min (ref >60.00)
Glucose, Bld: 102 mg/dL — ABNORMAL HIGH (ref 70–99)
Potassium: 3.6 mEq/L (ref 3.5–5.1)
Sodium: 138 mEq/L (ref 135–145)

## 2018-02-08 LAB — POCT URINALYSIS DIPSTICK
BILIRUBIN UA: NEGATIVE
GLUCOSE UA: NEGATIVE
Ketones, UA: NEGATIVE
Leukocytes, UA: NEGATIVE
Nitrite, UA: NEGATIVE
Protein, UA: POSITIVE — AB
RBC UA: NEGATIVE
Spec Grav, UA: >=1.03 — AB (ref 1.010–1.025)
Urobilinogen, UA: 1 E.U./dL
pH, UA: 6 (ref 5.0–8.0)

## 2018-02-08 LAB — HEPATIC FUNCTION PANEL
ALT: 124 U/L — AB (ref 0–53)
AST: 125 U/L — ABNORMAL HIGH (ref 0–37)
Albumin: 3.7 g/dL (ref 3.5–5.2)
Alkaline Phosphatase: 157 U/L — ABNORMAL HIGH (ref 39–117)
Bilirubin, Direct: 0.3 mg/dL (ref 0.0–0.3)
TOTAL PROTEIN: 8.1 g/dL (ref 6.0–8.3)
Total Bilirubin: 0.8 mg/dL (ref 0.2–1.2)

## 2018-02-08 LAB — BRAIN NATRIURETIC PEPTIDE: Pro B Natriuretic peptide (BNP): 21 pg/mL (ref 0.0–100.0)

## 2018-02-08 LAB — TSH: TSH: 5.52 u[IU]/mL — ABNORMAL HIGH (ref 0.35–4.50)

## 2018-02-08 MED ORDER — FUROSEMIDE 40 MG PO TABS: 40.0000 mg | ORAL_TABLET | Freq: Every day | ORAL | 3 refills | Status: DC

## 2018-02-08 MED ORDER — POTASSIUM CHLORIDE ER 10 MEQ PO TBCR: 10.0000 meq | EXTENDED_RELEASE_TABLET | Freq: Every day | ORAL | 2 refills | Status: DC

## 2018-02-08 NOTE — Patient Instructions (Signed)

## 2018-02-08 NOTE — Progress Notes (Addendum)
Subjective:     Patient ID: Ethan Anderson, male   DOB: 1934-10-01, 83 y.o.   MRN: [id number]  HPI Patient seen with bilateral leg edema. Onset about 2 weeks ago. Has hypertension treated with amlodipine but has been on this for several years. No history of heart failure. Denies any dyspnea or orthopnea. No suspicion for obstructive sleep apnea. No history of low albumin. No recent dietary changes. Does not take any over-the-counter medications.  History of chronic right eye uveitis. Recent chest x-ray unremarkable. Did have slightly elevated ACE level but we explained this is very nonspecific. He has no dyspnea whatsoever.  No chronic cough.  No skin rashes.    Past Medical History:  Diagnosis Date  . Arthritis   . Cataract    ight eye and has been removed  . Colon polyps   . Depression   . Glaucoma   . HTN (hypertension)   . Macular degeneration    Past Surgical History:  Procedure Laterality Date  . CATARACT EXTRACTION Right   . COLONOSCOPY    . MULTIPLE TOOTH EXTRACTIONS     pt weard upper & lower dentures  . POLYPECTOMY      reports that he quit smoking about 27 years ago. His smoking use included cigarettes. He has a 10.00 pack-year smoking history. He has never used smokeless tobacco. He reports that he does not drink alcohol or use drugs. family history includes Alcohol abuse in his cousin; Dementia in his father; Ovarian cancer in his mother; Stomach cancer in his mother; Thyroid cancer in his son. No Known Allergies   Review of Systems  Constitutional: Negative for appetite change, chills, fatigue, fever and unexpected weight change.  Eyes: Negative for visual disturbance.  Respiratory: Negative for cough, chest tightness and shortness of breath.   Cardiovascular: Positive for leg swelling. Negative for chest pain and palpitations.  Gastrointestinal: Negative for abdominal pain.  Endocrine: Negative for polydipsia and polyuria.  Genitourinary: Negative for difficulty  urinating and dysuria.  Neurological: Negative for dizziness, syncope, weakness, light-headedness and headaches.       Objective:   Physical Exam  Constitutional: He appears well-developed and well-nourished.  Neck: Neck supple.  Cardiovascular: Normal rate and regular rhythm. Exam reveals no gallop.  Pulmonary/Chest: Effort normal and breath sounds normal. He has no rales.  Musculoskeletal: He exhibits edema.  Patient has 1-2+ pitting edema all the way up to the upper leg near the knee bilaterally  Neurological: He is alert.       Assessment:     #1 bilateral leg edema relatively acute onset about 2 weeks ago. No associated dyspnea. No history of heart failure. He does take amlodipine 5 mg daily but has been on this for several years. No known history of venous stasis issues or diastolic dysfunction  #2 chronic right eye uveitis.  Ophthalmologist has concerns about ruling out sarcoidosis  #3 hypertension- stable.    Plan:     -Obtain further labs with TSH, urine dipstick, basic metabolic panel, hepatic panel, BNP level -Elevate legs frequently -Start furosemide 40 mg once daily along with K-Lor 10 mEq once daily -Follow-up immediately for any shortness of breath or other new symptoms otherwise plan 1 week follow-up -Set up CT chest to further evaluate chronic uveitis issues-rule out sarcoid changes.  Eulas Post MD [facility] [facility] at [facility]  Elevated liver transaminases and alk phos.  Will check 5' nucleotidase at follow up. Set up abdominal ultrasound to assess.  Eulas Post MD [facility] [facility] at [facility]

## 2018-02-09 NOTE — Addendum Note (Signed)
Addended by: Eulas Post on: 02/09/2018 09:29 PM   Modules accepted: Orders

## 2018-02-15 ENCOUNTER — Encounter: Payer: Self-pay | Admitting: Family Medicine

## 2018-02-15 ENCOUNTER — Ambulatory Visit (INDEPENDENT_AMBULATORY_CARE_PROVIDER_SITE_OTHER): Payer: Medicare Other | Admitting: Family Medicine

## 2018-02-15 VITALS — BP 130/70 | HR 101 | Temp 98.3°F | Wt 207.2 lb

## 2018-02-15 DIAGNOSIS — R600 Localized edema: Secondary | ICD-10-CM | POA: Diagnosis not present

## 2018-02-15 DIAGNOSIS — R7401 Elevation of levels of liver transaminase levels: Secondary | ICD-10-CM

## 2018-02-15 DIAGNOSIS — R740 Nonspecific elevation of levels of transaminase and lactic acid dehydrogenase [LDH]: Secondary | ICD-10-CM

## 2018-02-15 DIAGNOSIS — R748 Abnormal levels of other serum enzymes: Secondary | ICD-10-CM | POA: Diagnosis not present

## 2018-02-15 LAB — HEPATIC FUNCTION PANEL
ALT: 124 U/L — ABNORMAL HIGH (ref 0–53)
AST: 103 U/L — AB (ref 0–37)
Albumin: 3.8 g/dL (ref 3.5–5.2)
Alkaline Phosphatase: 151 U/L — ABNORMAL HIGH (ref 39–117)
Bilirubin, Direct: 0.2 mg/dL (ref 0.0–0.3)
TOTAL PROTEIN: 8.4 g/dL — AB (ref 6.0–8.3)
Total Bilirubin: 0.7 mg/dL (ref 0.2–1.2)

## 2018-02-15 LAB — BASIC METABOLIC PANEL
BUN: 10 mg/dL (ref 6–23)
CO2: 31 meq/L (ref 19–32)
CREATININE: 1.01 mg/dL (ref 0.40–1.50)
Calcium: 9.1 mg/dL (ref 8.4–10.5)
Chloride: 100 mEq/L (ref 96–112)
GFR: 91.2 mL/min (ref >60.00)
GLUCOSE: 101 mg/dL — AB (ref 70–99)
POTASSIUM: 4 meq/L (ref 3.5–5.1)
Sodium: 137 mEq/L (ref 135–145)

## 2018-02-15 LAB — FERRITIN: FERRITIN: 199.9 ng/mL (ref 22.0–322.0)

## 2018-02-15 NOTE — Progress Notes (Signed)
  Subjective:     Patient ID: Ethan Anderson, male   DOB: Sep 24, 1934, 83 y.o.   MRN: [id number]  HPI Patient is seen for follow-up regarding bilateral leg edema. Refer to recent note. He had no prior history of leg edema. He has hypertension treated with amlodipine and has been on this for years. Denies any dyspnea. No chest pain. We obtained several labs. Renal function was stable. TSH 5.52. BNP level XXI. Chemistries came back significant for elevated alkaline phosphatase along with AST and ALT. No alcohol use. Denies any recent nausea or vomiting. Appetite and weight stable.  Started furosemide 40 mg daily along with potassium supplement. His weight is unchanged today. He does feel his edema has improved somewhat. Patient relates he just had a grandson that died of some type of nonalcoholic cirrhosis. They're not sure of the exact etiology. They were trying to get more information regarding that. Patient uses no alcohol  He denies any bone pain. No abdominal pain.  Recent uveitis diagnosed by ophthalmology and recommendation was made to get CT chest rule out sarcoidosis.  Past Medical History:  Diagnosis Date  . Arthritis   . Cataract    ight eye and has been removed  . Colon polyps   . Depression   . Glaucoma   . HTN (hypertension)   . Macular degeneration    Past Surgical History:  Procedure Laterality Date  . CATARACT EXTRACTION Right   . COLONOSCOPY    . MULTIPLE TOOTH EXTRACTIONS     pt weard upper & lower dentures  . POLYPECTOMY      reports that he quit smoking about 27 years ago. His smoking use included cigarettes. He has a 10.00 pack-year smoking history. He has never used smokeless tobacco. He reports that he does not drink alcohol or use drugs. family history includes Alcohol abuse in his cousin; Dementia in his father; Ovarian cancer in his mother; Stomach cancer in his mother; Thyroid cancer in his son. No Known Allergies   Review of Systems  Constitutional:  Negative for chills, fatigue and fever.  Eyes: Negative for visual disturbance.  Respiratory: Negative for cough, chest tightness and shortness of breath.   Cardiovascular: Positive for leg swelling. Negative for chest pain and palpitations.  Gastrointestinal: Negative for abdominal pain, nausea and vomiting.  Neurological: Negative for dizziness, syncope, weakness, light-headedness and headaches.       Objective:   Physical Exam  Constitutional: He appears well-developed and well-nourished.  Cardiovascular: Normal rate and regular rhythm.  Pulmonary/Chest: Effort normal and breath sounds normal. He has no wheezes. He has no rales.  Abdominal: Soft. He exhibits no mass. There is no tenderness. There is no guarding.  No hepatomegaly noted  Musculoskeletal:  Patient has 1+ pitting edema lower legs feet and ankles bilaterally       Assessment:     #1 recent onset bilateral leg edema with no evidence for renal dysfunction, heart failure, hypoalbuminemia  #2 elevated liver transaminases and alkaline phosphatase of uncertain etiology.  No recent associated abdominal pain    Plan:     - patient has follow-up pending studies including CT chest and ultrasound abdomen  -Check follow-up labs with basic metabolic panel, hepatic panel, 5 prime nucleotidase , serum iron, TIBC, ferritin  -Increased furosemide to 60 mg every morning and elevate legs frequently  -Reassess in 2 weeks and sooner as needed   Eulas Post MD  Primary Care at [facility]

## 2018-02-15 NOTE — Patient Instructions (Signed)
Increase the Furosemide to 60 mg (one and one half tablets) daily.

## 2018-02-16 ENCOUNTER — Encounter: Payer: Self-pay | Admitting: *Deleted

## 2018-02-16 ENCOUNTER — Telehealth: Payer: Self-pay | Admitting: *Deleted

## 2018-02-16 NOTE — Telephone Encounter (Signed)
Noted. Thanks.

## 2018-02-16 NOTE — Telephone Encounter (Signed)
Copied from [place] [phone]. Topic: Inquiry >> Feb 16, 2018  8:19 AM Conception Chancy, NT wrote: Reason for CRM: patient wife is calling and states the patient was seen yesterday in the office and Dr. Elease Hashimoto wanted to know what her grandson died with. She states he died with Primary Scerosing Cholangitis.

## 2018-02-17 LAB — IRON,TIBC AND FERRITIN PANEL
%SAT: 27 % (calc) (ref 20–48)
Ferritin: 285 ng/mL (ref 24–380)
Iron: 111 ug/dL (ref 50–180)
TIBC: 407 ug/dL (ref 250–425)

## 2018-02-17 LAB — NUCLEOTIDASE, 5', BLOOD: 5-Nucleotidase: 5 U/L (ref 0–10)

## 2018-02-18 ENCOUNTER — Ambulatory Visit
Admission: RE | Admit: 2018-02-18 | Discharge: 2018-02-18 | Disposition: A | Discharge status: Home / Self Care | Payer: Medicare Other | Source: Ambulatory Visit | Attending: Family Medicine | Admitting: Family Medicine

## 2018-02-18 DIAGNOSIS — H2011 Chronic iridocyclitis, right eye: Secondary | ICD-10-CM

## 2018-02-18 DIAGNOSIS — R600 Localized edema: Secondary | ICD-10-CM

## 2018-02-19 ENCOUNTER — Ambulatory Visit
Admission: RE | Admit: 2018-02-19 | Discharge: 2018-02-19 | Disposition: A | Discharge status: Home / Self Care | Payer: Medicare Other | Source: Ambulatory Visit | Attending: Family Medicine | Admitting: Family Medicine

## 2018-02-19 DIAGNOSIS — R7401 Elevation of levels of liver transaminase levels: Secondary | ICD-10-CM

## 2018-02-19 DIAGNOSIS — R748 Abnormal levels of other serum enzymes: Secondary | ICD-10-CM

## 2018-02-19 DIAGNOSIS — R740 Nonspecific elevation of levels of transaminase and lactic acid dehydrogenase [LDH]: Principal | ICD-10-CM

## 2018-03-01 ENCOUNTER — Encounter: Payer: Self-pay | Admitting: Family Medicine

## 2018-03-01 ENCOUNTER — Ambulatory Visit (INDEPENDENT_AMBULATORY_CARE_PROVIDER_SITE_OTHER): Payer: Medicare Other | Admitting: Family Medicine

## 2018-03-01 VITALS — BP 126/78 | HR 88 | Temp 98.8°F | Resp 14 | Ht 67.0 in | Wt 207.0 lb

## 2018-03-01 DIAGNOSIS — R932 Abnormal findings on diagnostic imaging of liver and biliary tract: Secondary | ICD-10-CM | POA: Diagnosis not present

## 2018-03-01 DIAGNOSIS — H2010 Chronic iridocyclitis, unspecified eye: Secondary | ICD-10-CM | POA: Diagnosis not present

## 2018-03-01 DIAGNOSIS — R600 Localized edema: Secondary | ICD-10-CM

## 2018-03-01 DIAGNOSIS — R740 Nonspecific elevation of levels of transaminase and lactic acid dehydrogenase [LDH]: Secondary | ICD-10-CM | POA: Diagnosis not present

## 2018-03-01 DIAGNOSIS — R7401 Elevation of levels of liver transaminase levels: Secondary | ICD-10-CM

## 2018-03-01 LAB — POCT INR: INR: 1.1 — AB (ref 2.0–3.0)

## 2018-03-01 MED ORDER — FUROSEMIDE 40 MG PO TABS: 40.0000 mg | ORAL_TABLET | Freq: Every day | ORAL | 3 refills | Status: DC

## 2018-03-01 NOTE — Progress Notes (Signed)
  Subjective:     Patient ID: Ethan Anderson, male   DOB: 08-10-1935, 83 y.o.   MRN: [id number]  HPI Patient presented recently with bilateral leg edema. Past history of hypertension. No history of heart failure. BNP level normal. TSH slightly elevated 5.5. Patient had mildly elevated alkaline phosphatase as well as AST and ALTs levels. Normal albumin level.  We started furosemide and he's had some improvement in lower extremity edema, though no change in weight.  5'-nucleotidase was normal. Patient had abdominal ultrasound which showed changes of the liver consistent with possible cirrhosis. Also probable incidental note of gallstones. No biliary dilatation.  He has not had any right upper quadrant pain.  Wife had mentioned last visit but they had a grandson recently died of liver failure. She did some more research and this was apparently primary sclerosing cholangitis.  Patient has no hx of inflammatory bowel disease.  Patient has never had hepatitis C screening. No history of alcohol use. No history of IV drug use. No history of blood transfusions.  Recent CT chest obtained. He has history of some chronic iritis symptoms and ophthalmologist had recommended workup to rule out sarcoidosis. No acute pulmonary findings  Past Medical History:  Diagnosis Date  . Arthritis   . Cataract    ight eye and has been removed  . Colon polyps   . Depression   . Glaucoma   . HTN (hypertension)   . Macular degeneration    Past Surgical History:  Procedure Laterality Date  . CATARACT EXTRACTION Right   . COLONOSCOPY    . MULTIPLE TOOTH EXTRACTIONS     pt weard upper & lower dentures  . POLYPECTOMY      reports that he quit smoking about 27 years ago. His smoking use included cigarettes. He has a 10.00 pack-year smoking history. He has never used smokeless tobacco. He reports that he does not drink alcohol or use drugs. family history includes Alcohol abuse in his cousin; Dementia in his father; Other  in his grandchild; Ovarian cancer in his mother; Stomach cancer in his mother; Thyroid cancer in his son. No Known Allergies   Review of Systems  Constitutional: Negative for appetite change, chills, fatigue, fever and unexpected weight change.  Respiratory: Negative for cough and shortness of breath.   Cardiovascular: Positive for leg swelling. Negative for chest pain.  Gastrointestinal: Negative for abdominal pain.  Hematological: Negative for adenopathy.       Objective:   Physical Exam  Constitutional: He is oriented to person, place, and time. He appears well-developed and well-nourished.  Neck: Neck supple.  Cardiovascular: Normal rate and regular rhythm.  Pulmonary/Chest: Effort normal and breath sounds normal.  Musculoskeletal: He exhibits edema.  Neurological: He is alert and oriented to person, place, and time.       Assessment:     #1 recent abnormal abdominal ultrasound with question of cirrhosis per imaging. No history of ascites, hypoalbuminemia, thrombocytopenia, or other stigmata of cirrhosis.  #2 bilateral leg edema. No evidence for congestive heart failure  # 3 gallstones on ultrasound- suspect incidental.    Plan:     -Obtain further labs with hepatitis B and C studies. Check CBC, INR, basic metabolic panel -Consider GI referral for further evaluation- of elevated liver transaminases and possible cirrhosis changes of liver as per recent ultrasound and CT lung -Continue Lasix with refill given  Eulas Post MD [place] [facility] at [facility]

## 2018-03-02 DIAGNOSIS — H2010 Chronic iridocyclitis, unspecified eye: Secondary | ICD-10-CM | POA: Insufficient documentation

## 2018-03-02 LAB — BASIC METABOLIC PANEL
BUN: 12 mg/dL (ref 6–23)
CO2: 33 mEq/L — ABNORMAL HIGH (ref 19–32)
CREATININE: 1.14 mg/dL (ref 0.40–1.50)
Calcium: 9.1 mg/dL (ref 8.4–10.5)
Chloride: 99 mEq/L (ref 96–112)
GFR: 79.3 mL/min (ref >60.00)
GLUCOSE: 100 mg/dL — AB (ref 70–99)
Potassium: 3.7 mEq/L (ref 3.5–5.1)
Sodium: 137 mEq/L (ref 135–145)

## 2018-03-02 LAB — CBC WITH DIFFERENTIAL/PLATELET
Basophils Absolute: 0.1 10*3/uL (ref 0.0–0.1)
Basophils Relative: 1.1 % (ref 0.0–3.0)
EOS ABS: 0.4 10*3/uL (ref 0.0–0.7)
Eosinophils Relative: 4.6 % (ref 0.0–5.0)
HCT: 42.7 % (ref 39.0–52.0)
HEMOGLOBIN: 14.8 g/dL (ref 13.0–17.0)
LYMPHS ABS: 4 10*3/uL (ref 0.7–4.0)
Lymphocytes Relative: 44.1 % (ref 12.0–46.0)
MCHC: 34.6 g/dL (ref 30.0–36.0)
MCV: 89.5 fl (ref 78.0–100.0)
MONO ABS: 1 10*3/uL (ref 0.1–1.0)
Monocytes Relative: 10.5 % (ref 3.0–12.0)
NEUTROS PCT: 39.7 % — AB (ref 43.0–77.0)
Neutro Abs: 3.6 10*3/uL (ref 1.4–7.7)
Platelets: 180 10*3/uL (ref 150.0–400.0)
RBC: 4.77 Mil/uL (ref 4.22–5.81)
RDW: 14.8 % (ref 11.5–15.5)
WBC: 9.1 10*3/uL (ref 4.0–10.5)

## 2018-03-03 ENCOUNTER — Ambulatory Visit: Payer: Self-pay | Admitting: Family Medicine

## 2018-03-03 NOTE — Telephone Encounter (Signed)
Called pt and left a VM to call back. CRM created and sent to [facility] pool. 

## 2018-03-03 NOTE — Telephone Encounter (Signed)
Doris, his wife (on [place]) called in and was given lab results.   I let her know we would contact her with Hepatitis C RNA test results.

## 2018-03-04 LAB — HEPATITIS B CORE ANTIBODY, TOTAL: HEP B C TOTAL AB: NONREACTIVE

## 2018-03-04 LAB — HEPATITIS B SURFACE ANTIBODY,QUALITATIVE: Hep B S Ab: NONREACTIVE

## 2018-03-04 LAB — HEPATITIS C ANTIBODY
Hepatitis C Ab: REACTIVE — AB
SIGNAL TO CUT-OFF: 1.03 — ABNORMAL HIGH (ref <1.00)

## 2018-03-04 LAB — HEPATITIS B SURFACE ANTIGEN: Hepatitis B Surface Ag: NONREACTIVE

## 2018-03-04 LAB — HCV RNA,QUANTITATIVE REAL TIME PCR
HCV QUANT LOG: NOT DETECTED {Log_IU}/mL
HCV RNA, PCR, QN: <15 IU/mL

## 2018-03-08 ENCOUNTER — Other Ambulatory Visit: Payer: Self-pay | Admitting: Family Medicine

## 2018-03-22 ENCOUNTER — Ambulatory Visit (INDEPENDENT_AMBULATORY_CARE_PROVIDER_SITE_OTHER): Payer: Medicare Other | Admitting: Family Medicine

## 2018-03-22 VITALS — BP 130/72 | HR 97 | Temp 98.1°F | Wt 206.1 lb

## 2018-03-22 DIAGNOSIS — R768 Other specified abnormal immunological findings in serum: Secondary | ICD-10-CM | POA: Diagnosis not present

## 2018-03-22 DIAGNOSIS — R600 Localized edema: Secondary | ICD-10-CM | POA: Diagnosis not present

## 2018-03-22 DIAGNOSIS — R7689 Other specified abnormal immunological findings in serum: Secondary | ICD-10-CM | POA: Insufficient documentation

## 2018-03-22 DIAGNOSIS — R7401 Elevation of levels of liver transaminase levels: Secondary | ICD-10-CM | POA: Insufficient documentation

## 2018-03-22 DIAGNOSIS — R740 Nonspecific elevation of levels of transaminase and lactic acid dehydrogenase [LDH]: Secondary | ICD-10-CM

## 2018-03-22 MED ORDER — FUROSEMIDE 40 MG PO TABS: ORAL_TABLET | ORAL | 3 refills | Status: DC

## 2018-03-22 NOTE — Progress Notes (Signed)
Subjective:     Patient ID: Ethan Anderson, male   DOB: 1934-10-31, 83 y.o.   MRN: [id number]  HPI Patient seen for follow-up regarding recent elevated liver transaminases, bilateral leg edema, and CT scan showing possible cirrhotic type changes.  Refer to recent note from 03/01/18 for details  "Patient presented recently with bilateral leg edema. Past history of hypertension. No history of heart failure. BNP level normal. TSH slightly elevated 5.5. Patient had mildly elevated alkaline phosphatase as well as AST and ALTs levels. Normal albumin level.  We started furosemide and he's had some improvement in lower extremity edema, though no change in weight.  5'-nucleotidase was normal. Patient had abdominal ultrasound which showed changes of the liver consistent with possible cirrhosis. Also probable incidental note of gallstones. No biliary dilatation.  He has not had any right upper quadrant pain.  Wife had mentioned last visit but they had a grandson recently died of liver failure. She did some more research and this was apparently primary sclerosing cholangitis.  Patient has no hx of inflammatory bowel disease.  Patient has never had hepatitis C screening. No history of alcohol use. No history of IV drug use. No history of blood transfusions.  Recent CT chest obtained. He has history of some chronic iritis symptoms and ophthalmologist had recommended workup to rule out sarcoidosis. No acute pulmonary findings"  His initial hepatitis C antibody came back positive but he had negative RNA study.  He did not endorse any specific risk factors for hepatitis C infection. He has pending follow-up with GI this Wednesday. Recent INR 1.1. Hepatitis B studies negative.  Recent iron and TIBC normal.  Currently on furosemide 60 mg daily. His edema is somewhat improved and weight is down 1 pound from last visit.  Past Medical History:  Diagnosis Date  . Arthritis   . Cataract    ight eye and has been  removed  . Colon polyps   . Depression   . Glaucoma   . HTN (hypertension)   . Macular degeneration    Past Surgical History:  Procedure Laterality Date  . CATARACT EXTRACTION Right   . COLONOSCOPY    . MULTIPLE TOOTH EXTRACTIONS     pt weard upper & lower dentures  . POLYPECTOMY      reports that he quit smoking about 27 years ago. His smoking use included cigarettes. He has a 10.00 pack-year smoking history. He has never used smokeless tobacco. He reports that he does not drink alcohol or use drugs. family history includes Alcohol abuse in his cousin; Dementia in his father; Other in his grandchild; Ovarian cancer in his mother; Stomach cancer in his mother; Thyroid cancer in his son. No Known Allergies'   Review of Systems  Constitutional: Negative for chills, fatigue, fever and unexpected weight change.  Eyes: Negative for visual disturbance.  Respiratory: Negative for cough, chest tightness and shortness of breath.   Cardiovascular: Positive for leg swelling. Negative for chest pain and palpitations.  Gastrointestinal: Negative for abdominal pain.  Neurological: Negative for dizziness, syncope, weakness, light-headedness and headaches.       Objective:   Physical Exam  Constitutional: He is oriented to person, place, and time. He appears well-developed and well-nourished.  HENT:  Right Ear: External ear normal.  Left Ear: External ear normal.  Mouth/Throat: Oropharynx is clear and moist.  Eyes: Pupils are equal, round, and reactive to light.  Neck: Neck supple. No thyromegaly present.  Cardiovascular: Normal rate and regular rhythm.  Pulmonary/Chest: Effort normal and breath sounds normal. No respiratory distress. He has no wheezes. He has no rales.  Musculoskeletal: He exhibits edema.  Trace to 1+ pitting edema lower legs bilaterally  Neurological: He is alert and oriented to person, place, and time.       Assessment:     #1 bilateral leg edema. Overall  improved. Recent BNP normal. Recent albumin normal.  He is on Norvasc -but low dose and edema appeared well after starting the Norvasc.  #2 elevated liver transaminases of uncertain etiology  #3 recent positive hepatitis C antibody screen with negative RNA study. We explained this likely represents either a false positive hepatitis C antibody test versus previous infection that cleared  #4 Recent radiographic findings suggesting possible cirrhosis.     Plan:     -Patient reminded to keep follow-up with GI in 2 days   appreciate their input regarding elevated liver transaminases and question of cirrhosis on x-rays -Refilled furosemide to take once daily -Continue to elevate legs frequently. -consider change from Norvasc to alternative BP med if edema persists.  Ethan Post MD [place] [facility] at [facility]

## 2018-03-22 NOTE — Patient Instructions (Signed)
Continue to elevate legs frequently  Keep GI appointment as scheduled.

## 2018-03-24 ENCOUNTER — Encounter: Payer: Self-pay | Admitting: Physician Assistant

## 2018-03-24 ENCOUNTER — Other Ambulatory Visit (INDEPENDENT_AMBULATORY_CARE_PROVIDER_SITE_OTHER): Payer: Medicare Other

## 2018-03-24 ENCOUNTER — Ambulatory Visit (INDEPENDENT_AMBULATORY_CARE_PROVIDER_SITE_OTHER): Payer: Medicare Other | Admitting: Physician Assistant

## 2018-03-24 VITALS — BP 130/70 | HR 64 | Ht 67.0 in | Wt 206.0 lb

## 2018-03-24 DIAGNOSIS — R932 Abnormal findings on diagnostic imaging of liver and biliary tract: Secondary | ICD-10-CM

## 2018-03-24 DIAGNOSIS — R945 Abnormal results of liver function studies: Secondary | ICD-10-CM

## 2018-03-24 DIAGNOSIS — R7989 Other specified abnormal findings of blood chemistry: Secondary | ICD-10-CM

## 2018-03-24 LAB — HEPATIC FUNCTION PANEL
ALT: 105 U/L — ABNORMAL HIGH (ref 0–53)
AST: 93 U/L — AB (ref 0–37)
Albumin: 3.6 g/dL (ref 3.5–5.2)
Alkaline Phosphatase: 147 U/L — ABNORMAL HIGH (ref 39–117)
BILIRUBIN DIRECT: 0.2 mg/dL (ref 0.0–0.3)
BILIRUBIN TOTAL: 0.8 mg/dL (ref 0.2–1.2)
Total Protein: 8.4 g/dL — ABNORMAL HIGH (ref 6.0–8.3)

## 2018-03-24 LAB — PROTIME-INR
INR: 1.2 ratio — ABNORMAL HIGH (ref 0.8–1.0)
Prothrombin Time: 13.9 s — ABNORMAL HIGH (ref 9.6–13.1)

## 2018-03-24 NOTE — Progress Notes (Signed)
Thank you for sending this case to me. I have reviewed the entire note, and the outlined plan seems appropriate.  When I see him in follow-up, I will review labs, assess volume status and adjust diuretic therapy as needed as well as plan EGD.  Wilfrid Lund, MD

## 2018-03-24 NOTE — Progress Notes (Signed)
Chief Complaint: Elevated LFTs, abnormal imaging of the liver  HPI:    Ethan Anderson is an 83 year old African-American male with a past medical history as listed below, who was referred to me by Eulas Post, MD for a complaint of elevated LFTs and abnormal imaging of the liver.      02/18/2018 CT of the chest showed hepatic cirrhosis with large gallstone noted in the neck of the gallbladder.  02/19/2018 abdominal ultrasound showed coarse heterogenous echotexture of the hepatic parenchyma with associated irregular contour, which could be seen in the setting of cirrhosis.  Also a 2 cm septated cyst within the left hepatic lobe and cholelithiasis without sonographic features for acute cholecystitis and no biliary dilatation.    02/15/2018 LFTs with an alk phos elevated at 151, AST 103, ALT 124.  5-nucleotidase was normal.  Iron studies and ferritin normal.  Hep C antibody was reactive. Hep B surface antibody, hep B core antibody, hep B surface antigen and HCV RNA normal.    Today, presents to clinic accompanied by his wife.  They explain that they initially went to see the PCP in regards to leg swelling.  Patient was started on Furosemide now 60 mg which has helped with this but not completely stopped it from happening.  Patient does admit to increased alcohol intake in his youth, but has not drank for the past 20+ years or more.  There is some family history of [place] in their grandson who did pass away from what sounds like variceal bleeding.    Denies fever, chills, blood in stool, melena, weight loss, anorexia, nausea, vomiting, heartburn, reflux, history of IV drug use, tattoos or symptoms that awaken him at night.  Past Medical History:  Diagnosis Date  . Arthritis   . Cataract    ight eye and has been removed  . Colon polyps   . Depression   . Glaucoma   . HTN (hypertension)   . Macular degeneration     Past Surgical History:  Procedure Laterality Date  . CATARACT EXTRACTION Right   .  COLONOSCOPY    . MULTIPLE TOOTH EXTRACTIONS     pt weard upper & lower dentures  . POLYPECTOMY      Current Outpatient Medications  Medication Sig Dispense Refill  . amLODipine (NORVASC) 5 MG tablet TAKE ONE (1) TABLET EACH DAY 90 tablet 1  . furosemide (LASIX) 40 MG tablet Take one and one half tablets once daily. 135 tablet 3  . ketorolac (ACULAR) 0.5 % ophthalmic solution Place 1 drop into the right eye 2 (two) times daily.     . Multiple Vitamins-Minerals (EYE VITAMINS PO) Take 1 capsule by mouth daily.    . potassium chloride (K-DUR) 10 MEQ tablet TAKE ONE (1) TABLET EACH DAY 30 tablet 2   Current Facility-Administered Medications  Medication Dose Route Frequency Provider Last Rate Last Dose  . 0.9 %  sodium chloride infusion  500 mL Intravenous Once Doran Stabler, MD        Allergies as of 03/24/2018  . (No Known Allergies)    Family History  Problem Relation Age of Onset  . Ovarian cancer Mother   . Stomach cancer Mother   . Dementia Father   . Alcohol abuse Cousin   . Thyroid cancer Son   . Other Grandchild        Primary Sclerosing Cholangitis  . Colon cancer Neg Hx   . Esophageal cancer Neg Hx   . Rectal  cancer Neg Hx   . Pancreatic cancer Neg Hx   . Prostate cancer Neg Hx     Social History   Socioeconomic History  . Marital status: Married    Spouse name: Not on file  . Number of children: 6  . Years of education: Not on file  . Highest education level: Not on file  Occupational History  . Occupation: retired  Scientific laboratory technician  . Financial resource strain: Not on file  . Food insecurity:    Worry: Not on file    Inability: Not on file  . Transportation needs:    Medical: Not on file    Non-medical: Not on file  Tobacco Use  . Smoking status: Former Smoker    Packs/day: 1.00    Years: 10.00    Pack years: 10.00    Types: Cigarettes    Last attempt to quit: 08/18/1990    Years since quitting: 27.6  . Smokeless tobacco: Never Used  Substance  and Sexual Activity  . Alcohol use: No  . Drug use: No  . Sexual activity: Not on file  Lifestyle  . Physical activity:    Days per week: Not on file    Minutes per session: Not on file  . Stress: Not on file  Relationships  . Social connections:    Talks on phone: Not on file    Gets together: Not on file    Attends religious service: Not on file    Active member of club or organization: Not on file    Attends meetings of clubs or organizations: Not on file    Relationship status: Not on file  . Intimate partner violence:    Fear of current or ex partner: Not on file    Emotionally abused: Not on file    Physically abused: Not on file    Forced sexual activity: Not on file  Other Topics Concern  . Not on file  Social History Narrative  . Not on file    Review of Systems:    Constitutional: No weight loss, fever or chills Cardiovascular: No chest pain Respiratory: No SOB Gastrointestinal: See HPI and otherwise negative   Physical Exam:  Vital signs: BP 130/70   Pulse 64   Ht 5' 7"  (1.702 m)   Wt 206 lb (93.4 kg)   BMI 32.26 kg/m   Constitutional:   Pleasant AA male appears to be in NAD, Well developed, Well nourished, alert and cooperative Respiratory: Respirations even and unlabored. Lungs clear to auscultation bilaterally.   No wheezes, crackles, or rhonchi.  Cardiovascular: Normal S1, S2. No MRG. Regular rate and rhythm. No peripheral edema, cyanosis or pallor.  Gastrointestinal:  Soft, nondistended, nontender. No rebound or guarding. Normal bowel sounds. No appreciable masses or hepatomegaly. Psychiatric:  Demonstrates good judgement and reason without abnormal affect or behaviors.  RELEVANT LABS AND IMAGING: CBC    Component Value Date/Time   WBC 9.1 03/01/2018 1556   RBC 4.77 03/01/2018 1556   HGB 14.8 03/01/2018 1556   HCT 42.7 03/01/2018 1556   PLT 180.0 03/01/2018 1556   MCV 89.5 03/01/2018 1556   MCHC 34.6 03/01/2018 1556   RDW 14.8 03/01/2018  1556   LYMPHSABS 4.0 03/01/2018 1556   MONOABS 1.0 03/01/2018 1556   EOSABS 0.4 03/01/2018 1556   BASOSABS 0.1 03/01/2018 1556    CMP     Component Value Date/Time   NA 137 03/01/2018 1556   K 3.7 03/01/2018 1556   CL  99 03/01/2018 1556   CO2 33 (H) 03/01/2018 1556   GLUCOSE 100 (H) 03/01/2018 1556   BUN 12 03/01/2018 1556   CREATININE 1.14 03/01/2018 1556   CALCIUM 9.1 03/01/2018 1556   PROT 8.4 (H) 02/15/2018 1531   ALBUMIN 3.8 02/15/2018 1531   AST 103 (H) 02/15/2018 1531   ALT 124 (H) 02/15/2018 1531   ALKPHOS 151 (H) 02/15/2018 1531   BILITOT 0.7 02/15/2018 1531    Assessment: 1.  Abnormal imaging of the liver: Showing cirrhosis, hepatitis labs show a positive hep B antibody but negative HCV RNA, platelets normal, INR minimally elevated at 1.1, h/o heavy etoh use in youth 2.  Elevated LFTs: As above, will consider autoimmune causes vs other  Plan: 1.  We will order further liver serologies to assess for autoimmune liver processes. 2.  Discussed with the patient that we may want to add Aldactone in the future 3.  Patient may or may not need a liver biopsy in the future as well.  This was discussed 4.  Patient will also need EGD in the future for variceal screening purposes 5.  Recommend a less than 2 g sodium diet per day 6.  Discussed complete abstinence from alcohol and other hepatotoxic substances.  Answered questions. 7.  Continue Furosemide 60 mg daily for now 8.  Patient was scheduled follow-up with Dr. Loletha Carrow in 3-4 weeks.  Patient will be notified of lab testing results as above and any other additional recommendations in the meantime.  Ethan Newer, PA-C [facility] 03/24/2018, 9:43 AM  Cc: Eulas Post, MD

## 2018-03-24 NOTE — Patient Instructions (Signed)
Your provider has requested that you go to the basement level for lab work before leaving today. Press "B" on the elevator. The lab is located at the first door on the left as you exit the elevator.  

## 2018-03-29 LAB — MITOCHONDRIAL ANTIBODIES

## 2018-03-29 LAB — ALPHA-1-ANTITRYPSIN: A-1 Antitrypsin, Ser: 140 mg/dL (ref 83–199)

## 2018-03-29 LAB — ANTI-SMOOTH MUSCLE ANTIBODY, IGG: ACTIN (SMOOTH MUSCLE) ANTIBODY (IGG): 83 U — AB (ref <20)

## 2018-03-29 LAB — ANTI-NUCLEAR AB-TITER (ANA TITER): ANA Titer 1: 1:1280 {titer} — ABNORMAL HIGH

## 2018-03-29 LAB — CERULOPLASMIN: CERULOPLASMIN: 32 mg/dL (ref 18–36)

## 2018-03-29 LAB — ANA: ANA: POSITIVE — AB

## 2018-03-31 ENCOUNTER — Encounter (INDEPENDENT_AMBULATORY_CARE_PROVIDER_SITE_OTHER): Payer: Medicare Other | Admitting: Ophthalmology

## 2018-03-31 ENCOUNTER — Other Ambulatory Visit: Payer: Self-pay

## 2018-03-31 DIAGNOSIS — H43813 Vitreous degeneration, bilateral: Secondary | ICD-10-CM

## 2018-03-31 DIAGNOSIS — I10 Essential (primary) hypertension: Secondary | ICD-10-CM

## 2018-03-31 DIAGNOSIS — R7989 Other specified abnormal findings of blood chemistry: Secondary | ICD-10-CM

## 2018-03-31 DIAGNOSIS — H353112 Nonexudative age-related macular degeneration, right eye, intermediate dry stage: Secondary | ICD-10-CM | POA: Diagnosis not present

## 2018-03-31 DIAGNOSIS — H348122 Central retinal vein occlusion, left eye, stable: Secondary | ICD-10-CM | POA: Diagnosis not present

## 2018-03-31 DIAGNOSIS — H35033 Hypertensive retinopathy, bilateral: Secondary | ICD-10-CM | POA: Diagnosis not present

## 2018-03-31 DIAGNOSIS — R932 Abnormal findings on diagnostic imaging of liver and biliary tract: Secondary | ICD-10-CM

## 2018-03-31 DIAGNOSIS — R945 Abnormal results of liver function studies: Secondary | ICD-10-CM

## 2018-04-13 ENCOUNTER — Other Ambulatory Visit: Payer: Self-pay | Admitting: Physician Assistant

## 2018-04-14 ENCOUNTER — Ambulatory Visit (HOSPITAL_COMMUNITY)
Admission: RE | Admit: 2018-04-14 | Discharge: 2018-04-14 | Disposition: A | Discharge status: Home / Self Care | Payer: Medicare Other | Source: Ambulatory Visit | Attending: Gastroenterology | Admitting: Gastroenterology

## 2018-04-14 ENCOUNTER — Encounter (HOSPITAL_COMMUNITY): Payer: Self-pay

## 2018-04-14 DIAGNOSIS — Z811 Family history of alcohol abuse and dependence: Secondary | ICD-10-CM | POA: Diagnosis not present

## 2018-04-14 DIAGNOSIS — R945 Abnormal results of liver function studies: Secondary | ICD-10-CM | POA: Insufficient documentation

## 2018-04-14 DIAGNOSIS — Z8041 Family history of malignant neoplasm of ovary: Secondary | ICD-10-CM | POA: Insufficient documentation

## 2018-04-14 DIAGNOSIS — K754 Autoimmune hepatitis: Secondary | ICD-10-CM | POA: Diagnosis not present

## 2018-04-14 DIAGNOSIS — Z86010 Personal history of colonic polyps: Secondary | ICD-10-CM | POA: Diagnosis not present

## 2018-04-14 DIAGNOSIS — Z800 Family history of malignant neoplasm of digestive organs: Secondary | ICD-10-CM | POA: Diagnosis not present

## 2018-04-14 DIAGNOSIS — Z808 Family history of malignant neoplasm of other organs or systems: Secondary | ICD-10-CM | POA: Insufficient documentation

## 2018-04-14 DIAGNOSIS — I10 Essential (primary) hypertension: Secondary | ICD-10-CM | POA: Diagnosis not present

## 2018-04-14 DIAGNOSIS — Z9889 Other specified postprocedural states: Secondary | ICD-10-CM | POA: Diagnosis not present

## 2018-04-14 DIAGNOSIS — Z79899 Other long term (current) drug therapy: Secondary | ICD-10-CM | POA: Diagnosis not present

## 2018-04-14 DIAGNOSIS — R7989 Other specified abnormal findings of blood chemistry: Secondary | ICD-10-CM | POA: Diagnosis not present

## 2018-04-14 DIAGNOSIS — R932 Abnormal findings on diagnostic imaging of liver and biliary tract: Secondary | ICD-10-CM | POA: Diagnosis present

## 2018-04-14 DIAGNOSIS — M1990 Unspecified osteoarthritis, unspecified site: Secondary | ICD-10-CM | POA: Diagnosis not present

## 2018-04-14 DIAGNOSIS — K740 Hepatic fibrosis: Secondary | ICD-10-CM | POA: Insufficient documentation

## 2018-04-14 DIAGNOSIS — H409 Unspecified glaucoma: Secondary | ICD-10-CM | POA: Diagnosis not present

## 2018-04-14 DIAGNOSIS — K7460 Unspecified cirrhosis of liver: Secondary | ICD-10-CM | POA: Diagnosis not present

## 2018-04-14 DIAGNOSIS — Z87891 Personal history of nicotine dependence: Secondary | ICD-10-CM | POA: Diagnosis not present

## 2018-04-14 LAB — APTT: aPTT: 32 seconds (ref 24–36)

## 2018-04-14 LAB — CBC
HCT: 43.4 % (ref 39.0–52.0)
HEMOGLOBIN: 15 g/dL (ref 13.0–17.0)
MCH: 30.5 pg (ref 26.0–34.0)
MCHC: 34.6 g/dL (ref 30.0–36.0)
MCV: 88.4 fL (ref 78.0–100.0)
Platelets: 186 10*3/uL (ref 150–400)
RBC: 4.91 MIL/uL (ref 4.22–5.81)
RDW: 14.8 % (ref 11.5–15.5)
WBC: 7.7 10*3/uL (ref 4.0–10.5)

## 2018-04-14 LAB — PROTIME-INR
INR: 0.98
Prothrombin Time: 12.9 seconds (ref 11.4–15.2)

## 2018-04-14 MED ORDER — MIDAZOLAM HCL 2 MG/2ML IJ SOLN
INTRAMUSCULAR | Status: AC
  Filled 2018-04-14: qty 4

## 2018-04-14 MED ORDER — HYDROCODONE-ACETAMINOPHEN 5-325 MG PO TABS: 1.0000 | ORAL_TABLET | ORAL | Status: DC | PRN

## 2018-04-14 MED ORDER — SODIUM CHLORIDE 0.9 % IV SOLN
INTRAVENOUS | Status: DC
  Administered 2018-04-14: 11:00:00 via INTRAVENOUS

## 2018-04-14 MED ORDER — FENTANYL CITRATE (PF) 100 MCG/2ML IJ SOLN
INTRAMUSCULAR | Status: AC
  Filled 2018-04-14: qty 2

## 2018-04-14 MED ORDER — FENTANYL CITRATE (PF) 100 MCG/2ML IJ SOLN
INTRAMUSCULAR | Status: AC | PRN
  Administered 2018-04-14 (×2): 50 ug via INTRAVENOUS

## 2018-04-14 MED ORDER — MIDAZOLAM HCL 2 MG/2ML IJ SOLN
INTRAMUSCULAR | Status: AC | PRN
  Administered 2018-04-14 (×2): 1 mg via INTRAVENOUS

## 2018-04-14 MED ORDER — LIDOCAINE HCL (PF) 1 % IJ SOLN
INTRAMUSCULAR | Status: AC | PRN
  Administered 2018-04-14: 10 mL

## 2018-04-14 MED ORDER — LIDOCAINE HCL 1 % IJ SOLN
INTRAMUSCULAR | Status: AC
  Filled 2018-04-14: qty 20

## 2018-04-14 NOTE — Discharge Instructions (Signed)
Liver Biopsy, Care After °These instructions give you information on caring for yourself after your procedure. Your doctor may also give you more specific instructions. Call your doctor if you have any problems or questions after your procedure. °Follow these instructions at home: °· Rest at home for 1-2 days or as told by your doctor. °· Have someone stay with you for at least 24 hours. °· Do not do these things in the first 24 hours: °? Drive. °? Use machinery. °? Take care of other people. °? Sign legal documents. °? Take a bath or shower. °· There are many different ways to close and cover a cut (incision). For example, a cut can be closed with stitches, skin glue, or adhesive strips. Follow your doctor's instructions on: °? Taking care of your cut. °? Changing and removing your bandage (dressing). °? Removing whatever was used to close your cut. °· Do not drink alcohol in the first week. °· Do not lift more than 5 pounds or play contact sports for the first 2 weeks. °· Take medicines only as told by your doctor. For 1 week, do not take medicine that has aspirin in it or medicines like ibuprofen. °· Get your test results. °Contact a doctor if: °· A cut bleeds and leaves more than just a small spot of blood. °· A cut is red, puffs up (swells), or hurts more than before. °· Fluid or something else comes from a cut. °· A cut smells bad. °· You have a fever or chills. °Get help right away if: °· You have swelling, bloating, or pain in your belly (abdomen). °· You get dizzy or faint. °· You have a rash. °· You feel sick to your stomach (nauseous) or throw up (vomit). °· You have trouble breathing, feel short of breath, or feel faint. °· Your chest hurts. °· You have problems talking or seeing. °· You have trouble balancing or moving your arms or legs. °This information is not intended to replace advice given to you by your health care provider. Make sure you discuss any questions you have with your health care  provider. °Document Released: 05/06/2008 Document Revised: 01/03/2016 Document Reviewed: 09/23/2013 °Elsevier Interactive Patient Education © 2018 Elsevier Inc[employer] ° ° ° ° °Moderate Conscious Sedation, Adult, Care After °These instructions provide you with information about caring for yourself after your procedure. Your health care provider may also give you more specific instructions. Your treatment has been planned according to current medical practices, but problems sometimes occur. Call your health care provider if you have any problems or questions after your procedure. °What can I expect after the procedure? °After your procedure, it is common: °· To feel sleepy for several hours. °· To feel clumsy and have poor balance for several hours. °· To have poor judgment for several hours. °· To vomit if you eat too soon. ° °Follow these instructions at home: °For at least 24 hours after the procedure: ° °· Do not: °? Participate in activities where you could fall or become injured. °? Drive. °? Use heavy machinery. °? Drink alcohol. °? Take sleeping pills or medicines that cause drowsiness. °? Make important decisions or sign legal documents. °? Take care of children on your own. °· Rest. °Eating and drinking °· Follow the diet recommended by your health care provider. °· If you vomit: °? Drink water, juice, or soup when you can drink without vomiting. °? Make sure you have little or no nausea before eating solid foods. °General instructions °·   Have a responsible adult stay with you until you are awake and alert. °· Take over-the-counter and prescription medicines only as told by your health care provider. °· If you smoke, do not smoke without supervision. °· Keep all follow-up visits as told by your health care provider. This is important. °Contact a health care provider if: °· You keep feeling nauseous or you keep vomiting. °· You feel light-headed. °· You develop a rash. °· You have a fever. °Get help right away  if: °· You have trouble breathing. °This information is not intended to replace advice given to you by your health care provider. Make sure you discuss any questions you have with your health care provider. °Document Released: 05/18/2013 Document Revised: 12/31/2015 Document Reviewed: 11/17/2015 °Elsevier Interactive Patient Education © 2018 Elsevier Inc. ° ° °

## 2018-04-14 NOTE — H&P (Signed)
Chief Complaint: Patient was seen in consultation today for image guided random core liver biopsy  Referring Physician(s): Danis,Ethan L III  Supervising Physician: Arne Cleveland  Patient Status: [facility] - Out-pt  History of Present Illness: Ethan Anderson is an 83 y.o. male with history of elevated liver function tests, cirrhosis by imaging, elevated anti-smooth muscle antibody, positive ANA, hepatitis C antibody positive but HCV quantitative RNA negative who presents today for image guided random core liver biopsy for further evaluation.  Past Medical History:  Diagnosis Date  . Arthritis   . Cataract    ight eye and has been removed  . Colon polyps   . Depression   . Glaucoma   . HTN (hypertension)   . Macular degeneration     Past Surgical History:  Procedure Laterality Date  . CATARACT EXTRACTION Right   . COLONOSCOPY    . MULTIPLE TOOTH EXTRACTIONS     pt weard upper & lower dentures  . POLYPECTOMY      Allergies: Patient has no known allergies.  Medications: Prior to Admission medications   Medication Sig Start Date End Date Taking? Authorizing Provider  amLODipine (NORVASC) 5 MG tablet TAKE ONE (1) TABLET EACH DAY 10/23/17  Yes Burchette, Alinda Sierras, MD  furosemide (LASIX) 40 MG tablet Take one and one half tablets once daily. 03/22/18  Yes Burchette, Alinda Sierras, MD  ketorolac (ACULAR) 0.5 % ophthalmic solution Place 1 drop into the right eye 2 (two) times daily.  09/17/17  Yes [provider]  Multiple Vitamins-Minerals (EYE VITAMINS PO) Take 1 capsule by mouth daily.   Yes [provider]  potassium chloride (K-DUR) 10 MEQ tablet TAKE ONE (1) TABLET EACH DAY 03/08/18  Yes Burchette, Alinda Sierras, MD     Family History  Problem Relation Age of Onset  . Ovarian cancer Mother   . Stomach cancer Mother   . Dementia Father   . Alcohol abuse Cousin   . Thyroid cancer Son   . Other Grandchild        Primary Sclerosing Cholangitis  . Colon cancer Neg Hx    . Esophageal cancer Neg Hx   . Rectal cancer Neg Hx   . Pancreatic cancer Neg Hx   . Prostate cancer Neg Hx     Social History   Socioeconomic History  . Marital status: Married    Spouse name: Not on file  . Number of children: 6  . Years of education: Not on file  . Highest education level: Not on file  Occupational History  . Occupation: retired  Scientific laboratory technician  . Financial resource strain: Not on file  . Food insecurity:    Worry: Not on file    Inability: Not on file  . Transportation needs:    Medical: Not on file    Non-medical: Not on file  Tobacco Use  . Smoking status: Former Smoker    Packs/day: 1.00    Years: 10.00    Pack years: 10.00    Types: Cigarettes    Last attempt to quit: 08/18/1990    Years since quitting: 27.6  . Smokeless tobacco: Never Used  Substance and Sexual Activity  . Alcohol use: No  . Drug use: No  . Sexual activity: Not on file  Lifestyle  . Physical activity:    Days per week: Not on file    Minutes per session: Not on file  . Stress: Not on file  Relationships  . Social connections:  Talks on phone: Not on file    Gets together: Not on file    Attends religious service: Not on file    Active member of club or organization: Not on file    Attends meetings of clubs or organizations: Not on file    Relationship status: Not on file  Other Topics Concern  . Not on file  Social History Narrative  . Not on file      Review of Systems denies fever, headache, chest pain, dyspnea, cough, abdominal/back pain, nausea, vomiting or bleeding.  He does have some occasional sinus drainage.  Vital Signs: BP (!) 147/88   Pulse 73   Temp 98.1 F (36.7 C) (Oral)   Resp 14   Ht 5\' 7"  (1.702 m)   Wt 200 lb (90.7 kg)   SpO2 100%   BMI 31.32 kg/m   Physical Exam awake, alert.  Chest clear to auscultation bilaterally.  Heart with regular rate and rhythm.  Abdomen protuberant, soft, positive bowel sounds, nontender.  1+ bilateral lower  extremity edema noted, sl greater on right  Imaging: No results found.  Labs:  CBC: Recent Labs    03/01/18 1556 04/14/18 1115  WBC 9.1 7.7  HGB 14.8 15.0  HCT 42.7 43.4  PLT 180.0 186    COAGS: Recent Labs    03/01/18 1604 03/24/18 1022 04/14/18 1115  INR 1.1* 1.2* 0.98  APTT  --   --  32    BMP: Recent Labs    02/08/18 0936 02/15/18 1531 03/01/18 1556  NA 138 137 137  K 3.6 4.0 3.7  CL 103 100 99  CO2 27 31 33*  GLUCOSE 102* 101* 100*  BUN 15 10 12   CALCIUM 8.9 9.1 9.1  CREATININE 1.04 1.01 1.14    LIVER FUNCTION TESTS: Recent Labs    02/08/18 0936 02/15/18 1531 03/24/18 1022  BILITOT 0.8 0.7 0.8  AST 125* 103* 93*  ALT 124* 124* 105*  ALKPHOS 157* 151* 147*  PROT 8.1 8.4* 8.4*  ALBUMIN 3.7 3.8 3.6    TUMOR MARKERS: No results for input(s): AFPTM, CEA, CA199, CHROMGRNA in the last 8760 hours.  Assessment and Plan:  83 y.o. male with history of elevated liver function tests, cirrhosis by imaging, elevated anti-smooth muscle antibody, positive ANA, hepatitis C antibody positive but HCV quantitative RNA negative who presents today for image guided random core liver biopsy for further evaluation.Risks and benefits discussed with the patient/spouse including, but not limited to bleeding, infection, damage to adjacent structures or low yield requiring additional tests.  All of the patient's questions were answered, patient is agreeable to proceed. Consent signed and in chart.     Thank you for this interesting consult.  I greatly enjoyed meeting Ethan Anderson and look forward to participating in their care.  A copy of this report was sent to the requesting provider on this date.  Electronically Signed: D. Rowe Robert, PA-C 04/14/2018, 11:56 AM   I spent a total of  25 minutes   in face to face in clinical consultation, greater than 50% of which was counseling/coordinating care for image guided random core liver biopsy

## 2018-04-14 NOTE — Procedures (Signed)
  Procedure: US core liver 18g x3 EBL:   minimal Complications:  none immediate  See full dictation in [facility] PACS.  D. Jd Mccaster MD Main # [phone] Pager  [phone]    

## 2018-04-21 ENCOUNTER — Other Ambulatory Visit: Payer: Self-pay | Admitting: Family Medicine

## 2018-04-22 ENCOUNTER — Other Ambulatory Visit (INDEPENDENT_AMBULATORY_CARE_PROVIDER_SITE_OTHER): Payer: Medicare Other

## 2018-04-22 ENCOUNTER — Encounter: Payer: Self-pay | Admitting: Gastroenterology

## 2018-04-22 ENCOUNTER — Ambulatory Visit: Payer: Medicare Other | Admitting: Gastroenterology

## 2018-04-22 ENCOUNTER — Ambulatory Visit (INDEPENDENT_AMBULATORY_CARE_PROVIDER_SITE_OTHER): Payer: Medicare Other | Admitting: Gastroenterology

## 2018-04-22 VITALS — BP 142/70 | HR 76 | Ht 67.0 in | Wt 203.0 lb

## 2018-04-22 DIAGNOSIS — K754 Autoimmune hepatitis: Secondary | ICD-10-CM

## 2018-04-22 DIAGNOSIS — R600 Localized edema: Secondary | ICD-10-CM

## 2018-04-22 DIAGNOSIS — R748 Abnormal levels of other serum enzymes: Secondary | ICD-10-CM

## 2018-04-22 NOTE — Progress Notes (Signed)
**Ethan Anderson Ethan Anderson Anderson via Obfuscation** Ethan Anderson Ethan Anderson Anderson  Chief Complaint: Cirrhosis  Subjective  History:  Ethan Anderson Ethan Anderson Anderson is here today to discuss liver biopsy and lab results.  Ethan Anderson Ethan Anderson Anderson was referred back to [place] last month for abnormal LFTs and cirrhotic appearing liver on CT scan that had been discovered when Ethan Anderson Ethan Anderson Anderson went to Ethan Anderson primary care provider with peripheral edema.  Lab testing suggested autoimmune hepatitis, and liver biopsy was done September 4.  Ethan Anderson Ethan Anderson Anderson is feeling well and is accompanied by Ethan Anderson Ethan Anderson Anderson wife today.  Ethan Anderson Ethan Anderson Anderson denies abdominal pain, nausea, vomiting, early satiety, change in abdominal girth, gait unsteadiness, confusion, excessive fatigue chest pain or dyspnea. Ethan Anderson Ethan Anderson Anderson peripheral edema has improved with a recent increase in furosemide.  ROS: Cardiovascular:  no chest pain Respiratory: no dyspnea Remainder of systems negative except as above Ethan Anderson Ethan Anderson Anderson's Past Medical, Family and Social History were reviewed and are on file in Ethan Anderson EMR.  Objective:  Med list reviewed  Current Outpatient Medications:  .  amLODipine (NORVASC) 5 MG tablet, TAKE ONE (1) TABLET EACH DAY, Disp: 90 tablet, Rfl: 1 .  furosemide (LASIX) 40 MG tablet, Take one and one half tablets once daily., Disp: 135 tablet, Rfl: 3 .  ketorolac (ACULAR) 0.5 % ophthalmic solution, Place 1 drop into Ethan Anderson right eye 2 (two) times daily. , Disp: , Rfl:  .  Multiple Vitamins-Minerals (EYE VITAMINS PO), Take 1 capsule by mouth daily., Disp: , Rfl:  .  potassium chloride (K-DUR) 10 MEQ tablet, TAKE ONE (1) TABLET EACH DAY, Disp: 30 tablet, Rfl: 2  Current Facility-Administered Medications:  .  0.9 %  sodium chloride infusion, 500 mL, Intravenous, Once, Danis, Kirke Corin, MD   Vital signs in last 24 hrs: Vitals:   04/22/18 1529  BP: (!) 142/70  Pulse: 76    Physical Exam  Ethan Anderson Ethan Anderson Anderson is well-appearing and looks younger than stated age  83: sclera anicteric, oral mucosa moist without lesions  Neck: supple, no thyromegaly, JVD or  lymphadenopathy  Cardiac: RRR without murmurs, S1S2 heard, 2+ pretibial  edema bilaterally   Pulm: clear to auscultation bilaterally, normal RR and effort noted  Abdomen: soft, no tenderness, with active bowel sounds. No guarding or palpable hepatosplenomegaly.  Bulging flanks or shifting dullness  Skin; warm and dry, no jaundice or rash  Neuro steady gait, fluent speech, normal mentation no asterixis  Recent Labs:  CBC Latest Ref Rng & Units 04/14/2018 03/01/2018 11/24/2016  WBC 4.0 - 10.5 K/uL 7.7 9.1 6.3  Hemoglobin 13.0 - 17.0 g/dL 15.0 14.8 14.4  Hematocrit 39.0 - 52.0 % 43.4 42.7 43.4  Platelets 150 - 400 K/uL 186 180.0 259.0   CMP Latest Ref Rng & Units 03/24/2018 03/01/2018 02/15/2018  Glucose 70 - 99 mg/dL - 100(H) 101(H)  BUN 6 - 23 mg/dL - 12 10  Creatinine 0.40 - 1.50 mg/dL - 1.14 1.01  Sodium 135 - 145 mEq/L - 137 137  Potassium 3.5 - 5.1 mEq/L - 3.7 4.0  Chloride 96 - 112 mEq/L - 99 100  CO2 19 - 32 mEq/L - 33(H) 31  Calcium 8.4 - 10.5 mg/dL - 9.1 9.1  Total Protein 6.0 - 8.3 g/dL 8.4(H) - 8.4(H)  Total Bilirubin 0.2 - 1.2 mg/dL 0.8 - 0.7  Alkaline Phos 39 - 117 U/L 147(H) - 151(H)  AST 0 - 37 U/L 93(H) - 103(H)  ALT 0 - 53 U/L 105(H) - 124(H)    INR 0.98  Smooth muscle antibody significantly elevated at 83 ANA positive at 1:1280 AMA  negative A1AT normal Iron studies normal   Normal LFTs 2018, 2016, 2015, 2013  Radiologic studies:  Noncontrast CT chest, upon which Ethan Anderson diagnosis of cirrhosis was made based on radiologic description, was personally reviewed.  Ethan Anderson liver is enlarged, with a nodular contour of Ethan Anderson anterior aspect of both right and left lobes.  There is no ascites in Ethan Anderson upper abdomen.  I have just spoken with Ethan Anderson pathologist for a verbal report on Ethan Anderson liver biopsy, since it had not yet been reviewed and signed out.  There is apparently a lymphoplasmacytic infiltrate that looks typical for autoimmune hepatitis, grade 2-3.  There is also stage  II/III fibrosis seen.  @ASSESSMENTPLANBEGIN @ Assessment: Encounter Diagnoses  Name Primary?  . Autoimmune hepatitis ([place]) Yes  . Abnormal transaminases   . Localized edema    Despite Ethan Anderson etiology report, I think Ethan Anderson subtle findings of nodular liver contour not make a cirrhosis diagnosis in this Ethan Anderson Anderson.  Ethan Anderson Ethan Anderson Anderson has no clinical or laboratory evidence of portal hypertension, no ascites on that CT scan chest, and Ethan Anderson Ethan Anderson Anderson peripheral edema may have another cause.  Ethan Anderson Ethan Anderson Anderson clearly has autoimmune hepatitis, but biopsies only show grade 2-3 fibrosis.  Sampling error can certainly occur on biopsy, but again Ethan Anderson Ethan Anderson Anderson other clinical indicators did not point convincingly toward cirrhosis.  I am also not certain Ethan Anderson Ethan Anderson Anderson meets criteria to start immunosuppressive therapy with Ethan Anderson Ethan Anderson Anderson modest elevation of LFTs and liver biopsy findings as they are.   Plan: CMP today Total IgG level CT scan abdomen with oral and IV contrast for better imaging of Ethan Anderson liver Alpha-fetoprotein  Referral to [facility], which has a satellite clinic in [facility].  I would like their opinion about therapy for this.  Primary care: Consider addition of low-dose Aldactone to help control peripheral edema, depending upon creatinine and potassium level.  Total time 35 minutes, over half spent face-to-face with Ethan Anderson Anderson in counseling and coordination of care.   Nelida Meuse III

## 2018-04-22 NOTE — Patient Instructions (Signed)
If you are age 81 or older, your body mass index should be between 23-30. Your Body mass index is 31.79 kg/m. If this is out of the aforementioned range listed, please consider follow up with your Primary Care Provider.  If you are age 8 or younger, your body mass index should be between 19-25. Your Body mass index is 31.79 kg/m. If this is out of the aformentioned range listed, please consider follow up with your Primary Care Provider.   Your provider has requested that you go to the basement level for lab work before leaving today. Press "B" on the elevator. The lab is located at the first door on the left as you exit the elevator.  You have been scheduled for a CT scan of the abdomen and pelvis at [facility] ([address]---this is in the same building as Press [facility]).   You are scheduled on 05-10-2018 at 130pm. You should arrive 15 minutes prior to your appointment time for registration. Please follow the written instructions below on the day of your exam:  WARNING: IF YOU ARE ALLERGIC TO IODINE/X-RAY DYE, PLEASE NOTIFY RADIOLOGY IMMEDIATELY AT [phone]! YOU WILL BE GIVEN A 13 HOUR PREMEDICATION PREP.  1) Do not eat anything after 930am (4 hours prior to your test) 2) You have been given 1 bottle of oral contrast to drink. The solution may taste better if refrigerated, but do NOT add ice or any other liquid to this solution. Shake well before drinking.     Drink 1 bottle of contrast @ 1230pm (1 hour prior to your exam)  You may take any medications as prescribed with a small amount of water except for the following: Metformin, Glucophage, Glucovance, Avandamet, Riomet, Fortamet, Actoplus Met, Janumet, Glumetza or Metaglip. The above medications must be held the day of the exam AND 48 hours after the exam.  The purpose of you drinking the oral contrast is to aid in the visualization of your intestinal tract. The contrast solution may cause some diarrhea. Before  your exam is started, you will be given a small amount of fluid to drink. Depending on your individual set of symptoms, you may also receive an intravenous injection of x-ray contrast/dye. Plan on being at [facility] for 30 minutes or longer, depending on the type of exam you are having performed.  This test typically takes 30-45 minutes to complete.  If you have any questions regarding your exam or if you need to reschedule, you may call the CT department at [phone] between the hours of 8:00 am and 5:00 pm, Monday-Friday.  ________________________________________________________________________  It was a pleasure to see you today!  Dr. Loletha Carrow

## 2018-04-23 LAB — COMPREHENSIVE METABOLIC PANEL
ALT: 94 U/L — ABNORMAL HIGH (ref 0–53)
AST: 77 U/L — AB (ref 0–37)
Albumin: 3.8 g/dL (ref 3.5–5.2)
Alkaline Phosphatase: 142 U/L — ABNORMAL HIGH (ref 39–117)
BILIRUBIN TOTAL: 0.7 mg/dL (ref 0.2–1.2)
BUN: 13 mg/dL (ref 6–23)
CHLORIDE: 101 meq/L (ref 96–112)
CO2: 27 meq/L (ref 19–32)
Calcium: 9.7 mg/dL (ref 8.4–10.5)
Creatinine, Ser: 1.12 mg/dL (ref 0.40–1.50)
GFR: 80.9 mL/min (ref >60.00)
GLUCOSE: 102 mg/dL — AB (ref 70–99)
Potassium: 4 mEq/L (ref 3.5–5.1)
Sodium: 138 mEq/L (ref 135–145)
Total Protein: 8.8 g/dL — ABNORMAL HIGH (ref 6.0–8.3)

## 2018-04-23 LAB — IGG: IGG (IMMUNOGLOBIN G), SERUM: 2889 mg/dL — AB (ref 600–1540)

## 2018-04-23 LAB — AFP TUMOR MARKER: AFP TUMOR MARKER: 7.2 ng/mL — AB (ref <6.1)

## 2018-05-08 ENCOUNTER — Other Ambulatory Visit: Payer: Self-pay | Admitting: Family Medicine

## 2018-05-10 ENCOUNTER — Ambulatory Visit (INDEPENDENT_AMBULATORY_CARE_PROVIDER_SITE_OTHER)
Admission: RE | Admit: 2018-05-10 | Discharge: 2018-05-10 | Disposition: A | Discharge status: Home / Self Care | Payer: Medicare Other | Source: Ambulatory Visit | Attending: Gastroenterology | Admitting: Gastroenterology

## 2018-05-10 DIAGNOSIS — K754 Autoimmune hepatitis: Secondary | ICD-10-CM | POA: Diagnosis not present

## 2018-05-10 DIAGNOSIS — R600 Localized edema: Secondary | ICD-10-CM

## 2018-05-10 DIAGNOSIS — R748 Abnormal levels of other serum enzymes: Secondary | ICD-10-CM

## 2018-05-10 MED ORDER — IOPAMIDOL (ISOVUE-300) INJECTION 61%
100.0000 mL | Freq: Once | INTRAVENOUS | Status: AC | PRN
  Administered 2018-05-10: 100 mL via INTRAVENOUS

## 2018-05-12 ENCOUNTER — Telehealth: Payer: Self-pay

## 2018-05-12 NOTE — Telephone Encounter (Signed)
Pt. was referred to [facility]. On 04-23-2018 by Dr Loletha Carrow. Return fax from liver care. Records have been received. Will await appt info.

## 2018-05-20 NOTE — Telephone Encounter (Signed)
Pt has been scheduled for Oct 23 rd 2019.

## 2018-06-18 ENCOUNTER — Telehealth: Payer: Self-pay | Admitting: Gastroenterology

## 2018-06-18 NOTE — Telephone Encounter (Signed)
I reviewed the consult note from the liver clinic.  I am glad to read that they do not feel he needs medicines to treat the autoimmune hepatitis at this point.  They were planning to see him back in about 3 months.  Please let him know this, and then set a clinical reminder so he comes to see me in about 4 months. Call sooner if needed.

## 2018-06-21 NOTE — Telephone Encounter (Signed)
Spoke to Ethan Anderson wife Ethan Anderson. She agrees with the scheduled follow up. Recall placed.

## 2018-06-22 ENCOUNTER — Other Ambulatory Visit: Payer: Self-pay

## 2018-06-22 ENCOUNTER — Ambulatory Visit (INDEPENDENT_AMBULATORY_CARE_PROVIDER_SITE_OTHER): Payer: Medicare Other | Admitting: Family Medicine

## 2018-06-22 ENCOUNTER — Encounter: Payer: Self-pay | Admitting: Family Medicine

## 2018-06-22 VITALS — BP 132/64 | HR 98 | Temp 98.3°F | Ht 67.0 in | Wt 212.2 lb

## 2018-06-22 DIAGNOSIS — Z23 Encounter for immunization: Secondary | ICD-10-CM | POA: Diagnosis not present

## 2018-06-22 DIAGNOSIS — R600 Localized edema: Secondary | ICD-10-CM | POA: Diagnosis not present

## 2018-06-22 DIAGNOSIS — I10 Essential (primary) hypertension: Secondary | ICD-10-CM | POA: Diagnosis not present

## 2018-06-22 DIAGNOSIS — R7401 Elevation of levels of liver transaminase levels: Secondary | ICD-10-CM

## 2018-06-22 DIAGNOSIS — R740 Nonspecific elevation of levels of transaminase and lactic acid dehydrogenase [LDH]: Secondary | ICD-10-CM

## 2018-06-22 NOTE — Progress Notes (Signed)
Subjective:     Patient ID: Ethan Anderson, male   DOB: 03-07-1935, 83 y.o.   MRN: [id number]  HPI Patient seen for follow-up regarding his hypertension and peripheral edema.  He was seen several months ago and in process of work-up had elevated liver transaminases.  Has had fairly lengthy work-up including liver biopsy which shows evidence for autoimmune hepatitis but overall very stable.  Normal albumin.  His BNP level is normal.  We started Lasix currently 40 mg 1/2 tablets once daily.  He had some mild weight gain but attributes this to poor dietary intake.  He snacks a lot at night and eats a lot of high calorie foods such as ice cream.  Blood pressures been stable.  Remains on amlodipine.  No dyspnea.  No orthopnea.  Past Medical History:  Diagnosis Date  . Arthritis   . Cataract    ight eye and has been removed  . Colon polyps   . Depression   . Glaucoma   . HTN (hypertension)   . Macular degeneration    Past Surgical History:  Procedure Laterality Date  . CATARACT EXTRACTION Right   . COLONOSCOPY    . MULTIPLE TOOTH EXTRACTIONS     pt weard upper & lower dentures  . POLYPECTOMY      reports that he quit smoking about 27 years ago. His smoking use included cigarettes. He has a 10.00 pack-year smoking history. He has never used smokeless tobacco. He reports that he does not drink alcohol or use drugs. family history includes Alcohol abuse in his cousin; Dementia in his father; Other in his grandchild; Ovarian cancer in his mother; Stomach cancer in his mother; Thyroid cancer in his son. No Known Allergies   Review of Systems  Constitutional: Negative for appetite change, fatigue and unexpected weight change.  Eyes: Negative for visual disturbance.  Respiratory: Negative for cough, chest tightness and shortness of breath.   Cardiovascular: Negative for chest pain, palpitations and leg swelling.  Gastrointestinal: Negative for abdominal pain.  Neurological: Negative for  dizziness, syncope, weakness, light-headedness and headaches.       Objective:   Physical Exam  Constitutional: He is oriented to person, place, and time. He appears well-developed and well-nourished.  HENT:  Right Ear: External ear normal.  Left Ear: External ear normal.  Mouth/Throat: Oropharynx is clear and moist.  Eyes: Pupils are equal, round, and reactive to light.  Neck: Neck supple. No thyromegaly present.  Cardiovascular: Normal rate and regular rhythm.  Pulmonary/Chest: Effort normal and breath sounds normal. No respiratory distress. He has no wheezes. He has no rales.  Musculoskeletal: He exhibits edema.  He has mild edema lower legs bilaterally but nonpitting.  Overall improved  Neurological: He is alert and oriented to person, place, and time.       Assessment:     #1 hypertension stable and at goal  #2 peripheral edema.  Likely multifactorial-amlodipine, venous stasis, question component of diastolic dysfunction.  Recent albumin normal  #3 autoimmune hepatitis-followed by GI and liver clinic    Plan:     -Continue current medications. -Flu vaccine given -Elevate legs frequently -Monitor weights at least a few times weekly and be in touch for weight gain more than 5 pounds in 1 week or 3 pounds in 1 day -We will plan routine follow-up in 4 months and sooner as needed -We strongly advise he try to reduce overall calories and lose some weight  Ethan Post MD [facility]  at [facility]

## 2018-06-22 NOTE — Patient Instructions (Signed)
Try to lose some weight. Scale back sugar and starches. Weight yourself at least a few times weekly- be in touch if weight goes up more than 5 pounds in one week.

## 2018-08-30 LAB — BASIC METABOLIC PANEL
Creatinine: 1.4 — AB (ref 0.6–1.3)
Sodium: 140 (ref 137–147)

## 2018-08-30 LAB — HEPATIC FUNCTION PANEL
ALT: 57 — AB (ref 10–40)
AST: 50 — AB (ref 14–40)
Alkaline Phosphatase: 130 — AB (ref 25–125)

## 2018-08-31 ENCOUNTER — Other Ambulatory Visit: Payer: Self-pay | Admitting: Nurse Practitioner

## 2018-08-31 DIAGNOSIS — K7460 Unspecified cirrhosis of liver: Secondary | ICD-10-CM

## 2018-09-07 ENCOUNTER — Ambulatory Visit (HOSPITAL_COMMUNITY)
Admission: RE | Admit: 2018-09-07 | Discharge: 2018-09-07 | Disposition: A | Discharge status: Home / Self Care | Payer: Medicare Other | Source: Ambulatory Visit | Attending: Nurse Practitioner | Admitting: Nurse Practitioner

## 2018-09-07 ENCOUNTER — Ambulatory Visit (INDEPENDENT_AMBULATORY_CARE_PROVIDER_SITE_OTHER): Payer: Medicare Other | Admitting: Gastroenterology

## 2018-09-07 ENCOUNTER — Encounter: Payer: Self-pay | Admitting: Gastroenterology

## 2018-09-07 ENCOUNTER — Encounter (HOSPITAL_COMMUNITY): Payer: Self-pay

## 2018-09-07 VITALS — BP 140/80 | HR 82 | Ht 67.0 in | Wt 212.0 lb

## 2018-09-07 DIAGNOSIS — R748 Abnormal levels of other serum enzymes: Secondary | ICD-10-CM

## 2018-09-07 DIAGNOSIS — K754 Autoimmune hepatitis: Secondary | ICD-10-CM | POA: Diagnosis not present

## 2018-09-07 DIAGNOSIS — R600 Localized edema: Secondary | ICD-10-CM

## 2018-09-07 DIAGNOSIS — K7460 Unspecified cirrhosis of liver: Secondary | ICD-10-CM | POA: Insufficient documentation

## 2018-09-07 DIAGNOSIS — K7469 Other cirrhosis of liver: Secondary | ICD-10-CM | POA: Diagnosis not present

## 2018-09-07 DIAGNOSIS — Z23 Encounter for immunization: Secondary | ICD-10-CM | POA: Diagnosis not present

## 2018-09-07 NOTE — Patient Instructions (Signed)
If you are age 82 or older, your body mass index should be between 23-30. Your Body mass index is 33.2 kg/m. If this is out of the aforementioned range listed, please consider follow up with your Primary Care Provider.  If you are age 79 or younger, your body mass index should be between 19-25. Your Body mass index is 33.2 kg/m. If this is out of the aformentioned range listed, please consider follow up with your Primary Care Provider.   You have been scheduled for an endoscopy. Please follow written instructions given to you at your visit today. If you use inhalers (even only as needed), please bring them with you on the day of your procedure. Your physician has requested that you go to www.startemmi.com and enter the access code given to you at your visit today. This web site gives a general overview about your procedure. However, you should still follow specific instructions given to you by our office regarding your preparation for the procedure.    It was a pleasure to see you today!  Dr. Loletha Carrow

## 2018-09-07 NOTE — Progress Notes (Signed)
     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 I last saw him September 2019 with new diagnosis of biopsy-proven autoimmune hepatitis, stage II-III fibrosis.  See that note for details, CT scan had suggested nodular liver contour, but patient had no exam or laboratory evidence of portal hypertension.  He was seen in consultation by the [facility] in October, and again last week.  Meld score 8, CPT A, and he did not meet criteria for immunosuppressive therapy based on transaminases and IgG level. He is not immune to hepatitis A or B.  He has no evidence of ascites or encephalopathy.  He has not been screened for esophageal varices, but felt to be unlikely given normal platelet count.  That decision was left to me by the [facility]. They are performing his hepatoma screening with a repeat ultrasound in March of this year.  He denies dysphagia, odynophagia, nausea, vomiting, early satiety or weight loss. ROS: Cardiovascular:  no chest pain Respiratory: no dyspnea Arthritis Remainder of systems negative except as above The patient's Past Medical, Family and Social History were reviewed and are on file in the EMR.  Objective:  Med list reviewed  Current Outpatient Medications:  .  amLODipine (NORVASC) 5 MG tablet, TAKE ONE (1) TABLET EACH DAY, Disp: 90 tablet, Rfl: 1 .  furosemide (LASIX) 40 MG tablet, Take one and one half tablets once daily., Disp: 135 tablet, Rfl: 3 .  ketorolac (ACULAR) 0.5 % ophthalmic solution, Place 1 drop into the right eye 2 (two) times daily. , Disp: , Rfl:  .  Multiple Vitamins-Minerals (EYE VITAMINS PO), Take 1 capsule by mouth daily., Disp: , Rfl:  .  potassium chloride (K-DUR) 10 MEQ tablet, TAKE ONE (1) TABLET EACH DAY, Disp: 90 tablet, Rfl: 1  Current Facility-Administered Medications:  .  0.9 %  sodium chloride  infusion, 500 mL, Intravenous, Once, Danis, Estill Cotta III, MD   Vital signs in last 24 hrs: Vitals:   09/07/18 1346  BP: 140/80  Pulse: 82    Physical Exam  Well-appearing  HEENT: sclera anicteric, oral mucosa moist without lesions  Neck: supple, no thyromegaly, JVD or lymphadenopathy  Cardiac: RRR without murmurs, S1S2 heard, mild peripheral edema  Pulm: clear to auscultation bilaterally, normal RR and effort noted  Abdomen: soft, no tenderness, with active bowel sounds. No guarding or palpable hepatosplenomegaly.  Skin; warm and dry, no jaundice or rash  Recent Labs:   Extensive labs and imaging reviewed in the to hepatology clinic notes that are on file.   @ASSESSMENTPLANBEGIN @ Assessment: Encounter Diagnoses  Name Primary?  . Autoimmune hepatitis ([place]) Yes  . Other cirrhosis of liver ([place])   . Abnormal transaminases   . Localized edema    Autoimmune hepatitis does not currently require immunosuppressive therapy. Compensated cirrhosis, no lab or exam evidence of portal hypertension.  Upper endoscopy reasonable to screen for varices.  Hepatitis A vaccine will be arranged.  He is at very low risk for hepatitis B.  He received a flu vaccine this year.  2 g daily sodium restriction and compression stockings to control mild peripheral edema.  Total time 30 minutes, over half spent face-to-face with patient in counseling and coordination of care.   Nelida Meuse III

## 2018-09-21 ENCOUNTER — Encounter: Payer: Self-pay | Admitting: Gastroenterology

## 2018-09-21 ENCOUNTER — Ambulatory Visit (AMBULATORY_SURGERY_CENTER): Payer: Medicare Other | Admitting: Gastroenterology

## 2018-09-21 VITALS — BP 123/74 | HR 79 | Temp 98.4°F | Resp 13 | Ht 67.0 in | Wt 212.0 lb

## 2018-09-21 DIAGNOSIS — K7469 Other cirrhosis of liver: Secondary | ICD-10-CM

## 2018-09-21 DIAGNOSIS — K449 Diaphragmatic hernia without obstruction or gangrene: Secondary | ICD-10-CM | POA: Diagnosis not present

## 2018-09-21 HISTORY — PX: UPPER GI ENDOSCOPY: SHX6162

## 2018-09-21 MED ORDER — SODIUM CHLORIDE 0.9 % IV SOLN: 500.0000 mL | Freq: Once | INTRAVENOUS | Status: DC

## 2018-09-21 NOTE — Op Note (Signed)
Ethan Anderson: Ethan Anderson Procedure Date: 09/21/2018 10:20 AM MRN: [id number] Endoscopist: Mallie Mussel L. Ethan Anderson , MD Age: 84 Referring MD:  Date of Birth: January 27, 1935 Gender: Male Account #: [id number] Procedure:                Upper GI endoscopy Indications:              Cirrhosis rule out esophageal varices (AIH not                            requiring therapy. Early stage cirrhosis on Bx, no                            clinical stigmata of portal HTN) Medicines:                Monitored Anesthesia Care Procedure:                Pre-Anesthesia Assessment:                           - Prior to the procedure, a History and Physical                            was performed, and patient medications and                            allergies were reviewed. The patient's tolerance of                            previous anesthesia was also reviewed. The risks                            and benefits of the procedure and the sedation                            options and risks were discussed with the patient.                            All questions were answered, and informed consent                            was obtained. Prior Anticoagulants: The patient has                            taken no previous anticoagulant or antiplatelet                            agents. ASA Grade Assessment: II - A patient with                            mild systemic disease. After reviewing the risks                            and benefits, the patient was deemed in  satisfactory condition to undergo the procedure.                           After obtaining informed consent, the endoscope was                            passed under direct vision. Throughout the                            procedure, the patient's blood pressure, pulse, and                            oxygen saturations were monitored continuously. The                            Endoscope was introduced  through the mouth, and                            advanced to the second part of duodenum. The upper                            GI endoscopy was accomplished without difficulty.                            The patient tolerated the procedure well. Scope In: Scope Out: Findings:                 The esophagus was normal.                           There is no endoscopic evidence of varices in the                            entire esophagus.                           A small hiatal hernia was present.                           There is no endoscopic evidence of varices in the                            cardia and in the gastric fundus.                           The examined duodenum was normal. Complications:            No immediate complications. Estimated Blood Loss:     Estimated blood loss: none. Impression:               - Normal esophagus.                           - Small hiatal hernia.                           - Normal examined duodenum.                           -  No specimens collected. Recommendation:           - Patient has a contact number available for                            emergencies. The signs and symptoms of potential                            delayed complications were discussed with the                            patient. Return to normal activities tomorrow.                            Written discharge instructions were provided to the                            patient.                           - Resume previous diet.                           - Continue present medications.                           - No repeat screening upper endoscopy due to age                            and stability of early stage liver disease. Anisah Kuck L. Ethan Carrow, MD 09/21/2018 10:38:03 AM This report has been signed electronically.

## 2018-09-21 NOTE — Progress Notes (Signed)
PT taken to PACU. Monitors in place. VSS. Report given to RN. 

## 2018-09-21 NOTE — Patient Instructions (Signed)
YOU HAD AN ENDOSCOPIC PROCEDURE TODAY AT THE [facility]:   Refer to the procedure report that was given to you for any specific questions about what was found during the examination.  If the procedure report does not answer your questions, please call your gastroenterologist to clarify.  If you requested that your care partner not be given the details of your procedure findings, then the procedure report has been included in a sealed envelope for you to review at your convenience later.  YOU SHOULD EXPECT: Some feelings of bloating in the abdomen. Passage of more gas than usual.  Walking can help get rid of the air that was put into your GI tract during the procedure and reduce the bloating. If you had a lower endoscopy (such as a colonoscopy or flexible sigmoidoscopy) you may notice spotting of blood in your stool or on the toilet paper. If you underwent a bowel prep for your procedure, you may not have a normal bowel movement for a few days.  Please Note:  You might notice some irritation and congestion in your nose or some drainage.  This is from the oxygen used during your procedure.  There is no need for concern and it should clear up in a day or so.  SYMPTOMS TO REPORT IMMEDIATELY:   Following upper endoscopy (EGD)  Vomiting of blood or coffee ground material  New chest pain or pain under the shoulder blades  Painful or persistently difficult swallowing  New shortness of breath  Fever of 100F or higher  Black, tarry-looking stools  For urgent or emergent issues, a gastroenterologist can be reached at any hour by calling [phone].   DIET:  We do recommend a small meal at first, but then you may proceed to your regular diet.  Drink plenty of fluids but you should avoid alcoholic beverages for 24 hours.  ACTIVITY:  You should plan to take it easy for the rest of today and you should NOT DRIVE or use heavy machinery until tomorrow (because of the sedation medicines used  during the test).    FOLLOW UP: Our staff will call the number listed on your records the next business day following your procedure to check on you and address any questions or concerns that you may have regarding the information given to you following your procedure. If we do not reach you, we will leave a message.  However, if you are feeling well and you are not experiencing any problems, there is no need to return our call.  We will assume that you have returned to your regular daily activities without incident.  If any biopsies were taken you will be contacted by phone or by letter within the next 1-3 weeks.  Please call us at [phone] if you have not heard about the biopsies in 3 weeks.    SIGNATURES/CONFIDENTIALITY: You and/or your care partner have signed paperwork which will be entered into your electronic medical record.  These signatures attest to the fact that that the information above on your After Visit Summary has been reviewed and is understood.  Full responsibility of the confidentiality of this discharge information lies with you and/or your care-partner. 

## 2018-09-22 ENCOUNTER — Telehealth: Payer: Self-pay

## 2018-09-22 ENCOUNTER — Other Ambulatory Visit: Payer: Self-pay

## 2018-09-22 ENCOUNTER — Encounter: Payer: Self-pay | Admitting: Family Medicine

## 2018-09-22 ENCOUNTER — Ambulatory Visit (INDEPENDENT_AMBULATORY_CARE_PROVIDER_SITE_OTHER): Payer: Medicare Other | Admitting: Family Medicine

## 2018-09-22 VITALS — BP 118/70 | HR 92 | Temp 98.1°F | Ht 67.0 in | Wt 211.9 lb

## 2018-09-22 DIAGNOSIS — I10 Essential (primary) hypertension: Secondary | ICD-10-CM

## 2018-09-22 DIAGNOSIS — R600 Localized edema: Secondary | ICD-10-CM | POA: Diagnosis not present

## 2018-09-22 DIAGNOSIS — K754 Autoimmune hepatitis: Secondary | ICD-10-CM

## 2018-09-22 MED ORDER — AMLODIPINE BESYLATE 5 MG PO TABS: ORAL_TABLET | ORAL | 3 refills | Status: DC

## 2018-09-22 NOTE — Telephone Encounter (Signed)
  Follow up Call-  Call back number 09/21/2018 10/21/2017  Post procedure Call Back phone  # [phone] hm  Permission to leave phone message Yes Yes  Some recent data might be hidden     Patient questions:  Do you have a fever, pain , or abdominal swelling? No. Pain Score  0 *  Have you tolerated food without any problems? Yes.    Have you been able to return to your normal activities? Yes.    Do you have any questions about your discharge instructions: Diet   No. Medications  No. Follow up visit  No.  Do you have questions or concerns about your Care? No.  Actions: * If pain score is 4 or above: No action needed, pain <4.

## 2018-09-22 NOTE — Progress Notes (Signed)
Subjective:     Patient ID: Ethan Anderson, male   DOB: 06-18-1935, 84 y.o.   MRN: [id number]  HPI Patient seen for medical follow-up  Hypertension.  Currently takes amlodipine and furosemide.  Blood pressures been stable.  He has history of some peripheral edema which is overall improved with current regimen.  He states he just had lab work done in January through the liver care clinic.  We cannot find a copy of those labs.  Denies any headaches, chest pains, or dizziness  He has autoimmune hepatitis.  He had EGD just yesterday which showed no evidence for esophageal varices.  He has follow-up abdominal ultrasound and March to reassess.  He had recent hepatitis A vaccine.  Past Medical History:  Diagnosis Date  . Arthritis   . Cataract    ight eye and has been removed  . Colon polyps   . Depression   . Glaucoma   . HTN (hypertension)   . Macular degeneration    Past Surgical History:  Procedure Laterality Date  . CATARACT EXTRACTION Right   . COLONOSCOPY    . MULTIPLE TOOTH EXTRACTIONS     pt weard upper & lower dentures  . POLYPECTOMY    . UPPER GI ENDOSCOPY  09/21/2018    reports that he quit smoking about 28 years ago. His smoking use included cigarettes. He has a 10.00 pack-year smoking history. He has never used smokeless tobacco. He reports that he does not drink alcohol or use drugs. family history includes Alcohol abuse in his cousin; Dementia in his father; Other in his grandchild; Ovarian cancer in his mother; Stomach cancer in his mother; Thyroid cancer in his son. No Known Allergies   Review of Systems  Constitutional: Negative for fatigue.  Eyes: Negative for visual disturbance.  Respiratory: Negative for cough, chest tightness and shortness of breath.   Cardiovascular: Negative for chest pain, palpitations and leg swelling.  Neurological: Negative for dizziness, syncope, weakness, light-headedness and headaches.       Objective:   Physical  Exam Constitutional:      Appearance: He is well-developed.  HENT:     Right Ear: External ear normal.     Left Ear: External ear normal.  Eyes:     Pupils: Pupils are equal, round, and reactive to light.  Neck:     Musculoskeletal: Neck supple.     Thyroid: No thyromegaly.  Cardiovascular:     Rate and Rhythm: Normal rate and regular rhythm.  Pulmonary:     Effort: Pulmonary effort is normal. No respiratory distress.     Breath sounds: Normal breath sounds. No wheezing or rales.  Musculoskeletal:     Comments: Only trace edema ankles and legs bilaterally  Neurological:     Mental Status: He is alert and oriented to person, place, and time.        Assessment:     #1 hypertension stable and at goal  #2 bilateral leg edema overall stable and improved  #3 autoimmune hepatitis.  Recent EGD as above with no varices.  Does not meet criteria for immunotherapy at this point    Plan:     -Refill amlodipine for 1 year -We elected not to recheck labs since he reportedly had these done just couple weeks ago through the liver care clinic.  We will try to get records from those labs -Watch sodium intake -Routine follow-up in 6 months and sooner as needed  Eulas Post MD [facility] at  [facility]

## 2018-09-23 ENCOUNTER — Ambulatory Visit: Payer: Medicare Other | Admitting: Gastroenterology

## 2018-10-26 ENCOUNTER — Ambulatory Visit (HOSPITAL_COMMUNITY)
Admission: RE | Admit: 2018-10-26 | Discharge: 2018-10-26 | Disposition: A | Discharge status: Home / Self Care | Payer: Medicare Other | Source: Ambulatory Visit | Attending: Nurse Practitioner | Admitting: Nurse Practitioner

## 2018-10-26 ENCOUNTER — Other Ambulatory Visit: Payer: Self-pay

## 2018-10-26 DIAGNOSIS — K7460 Unspecified cirrhosis of liver: Secondary | ICD-10-CM | POA: Insufficient documentation

## 2018-11-01 ENCOUNTER — Other Ambulatory Visit: Payer: Self-pay | Admitting: Family Medicine

## 2019-01-27 ENCOUNTER — Other Ambulatory Visit: Payer: Self-pay | Admitting: Family Medicine

## 2019-03-09 ENCOUNTER — Ambulatory Visit (INDEPENDENT_AMBULATORY_CARE_PROVIDER_SITE_OTHER): Payer: Medicare Other | Admitting: Gastroenterology

## 2019-03-09 DIAGNOSIS — Z23 Encounter for immunization: Secondary | ICD-10-CM | POA: Diagnosis not present

## 2019-03-09 DIAGNOSIS — K754 Autoimmune hepatitis: Secondary | ICD-10-CM

## 2019-03-23 ENCOUNTER — Other Ambulatory Visit: Payer: Self-pay

## 2019-03-23 ENCOUNTER — Ambulatory Visit (INDEPENDENT_AMBULATORY_CARE_PROVIDER_SITE_OTHER): Payer: Medicare Other | Admitting: Family Medicine

## 2019-03-23 DIAGNOSIS — Z79899 Other long term (current) drug therapy: Secondary | ICD-10-CM | POA: Diagnosis not present

## 2019-03-23 DIAGNOSIS — K8020 Calculus of gallbladder without cholecystitis without obstruction: Secondary | ICD-10-CM | POA: Diagnosis not present

## 2019-03-23 DIAGNOSIS — K754 Autoimmune hepatitis: Secondary | ICD-10-CM | POA: Diagnosis not present

## 2019-03-23 DIAGNOSIS — I10 Essential (primary) hypertension: Secondary | ICD-10-CM

## 2019-03-23 NOTE — Progress Notes (Signed)
Patient ID: Ethan Anderson, male   DOB: 07-19-1935, 84 y.o.   MRN: [id number]  This visit type was conducted due to national recommendations for restrictions regarding the COVID-19 pandemic in an effort to limit this patient's exposure and mitigate transmission in our community.   Virtual Visit via Telephone Note  I connected with Ethan Anderson on 03/23/19 at 10:00 AM EDT by telephone and verified that I am speaking with the correct person using two identifiers.   I discussed the limitations, risks, security and privacy concerns of performing an evaluation and management service by telephone and the availability of in person appointments. I also discussed with the patient that there may be a patient responsible charge related to this service. The patient expressed understanding and agreed to proceed.  Location patient: home Location provider: work or home office Participants present for the call: patient, provider Patient did not have a visit in the prior 7 days to address this/these issue(s).   History of Present Illness:   Patient's chronic problems including hypertension, autoimmune hepatitis, glaucoma, history of gout.  He is followed by GI and hepatologist.  He had CT abdomen pelvis last year which showed large gallstone in the gallbladder neck and this was also confirmed by ultrasound.  He has not had any abdominal pain, fever, nausea, or vomiting.  They state he had recent shingles on the face but that has fully healed with no significant postherpetic neuropathy pains.  He has hypertension treated with amlodipine 5 mg daily.  Has had peripheral edema issues controlled with furosemide.  Also takes potassium supplement.  Last electrolytes were last January.  Compliant with therapy.  Blood pressure this morning 120/70.  His weight is down to 198 pounds by home scales but they say this is typical for him.  He was 211 here in February.  He states he typically does not eat as much in the  summertime.  Denies any recent fever, chills, cough, dyspnea, chest pains  Past Medical History:  Diagnosis Date  . Arthritis   . Cataract    ight eye and has been removed  . Colon polyps   . Depression   . Glaucoma   . HTN (hypertension)   . Macular degeneration    Past Surgical History:  Procedure Laterality Date  . CATARACT EXTRACTION Right   . COLONOSCOPY    . MULTIPLE TOOTH EXTRACTIONS     pt weard upper & lower dentures  . POLYPECTOMY    . UPPER GI ENDOSCOPY  09/21/2018    reports that he quit smoking about 28 years ago. His smoking use included cigarettes. He has a 10.00 pack-year smoking history. He has never used smokeless tobacco. He reports that he does not drink alcohol or use drugs. family history includes Alcohol abuse in his cousin; Dementia in his father; Other in his grandchild; Ovarian cancer in his mother; Stomach cancer in his mother; Thyroid cancer in his son. No Known Allergies  Observations/Objective: Patient sounds cheerful and well on the phone. I do not appreciate any SOB. Speech and thought processing are grossly intact. Patient reported vitals:  Assessment and Plan:  #1 hypertension stable by home readings -Continue amlodipine 5 mg daily  #2 history of peripheral edema currently stable.  His weight is actually down some which he attributes to some dietary change.  Overall feels well.  Remains on furosemide -Check comprehensive metabolic panel.  Future lab order placed for August 24  #3 asymptomatic gallstones noted incidentally on imaging.  They  had several questions regarding this.  Follow-up promptly for any right upper quadrant abdominal pain, nausea or vomiting, or any unexplained fever  Follow Up Instructions:  -For lab work August 24 as above.  Routine follow-up in 6 months   [id number] [id number] [id number] 11-20 [id number] 21-30 I did not refer this patient for an OV in the next 24 hours for this/these issue(s).  I discussed the assessment and  treatment plan with the patient. The patient was provided an opportunity to ask questions and all were answered. The patient agreed with the plan and demonstrated an understanding of the instructions.   The patient was advised to call back or seek an in-person evaluation if the symptoms worsen or if the condition fails to improve as anticipated.  I provided 25 minutes of non-face-to-face time during this encounter.   Carolann Littler, MD

## 2019-04-04 ENCOUNTER — Other Ambulatory Visit: Payer: Self-pay

## 2019-04-04 ENCOUNTER — Other Ambulatory Visit (INDEPENDENT_AMBULATORY_CARE_PROVIDER_SITE_OTHER): Payer: Medicare Other

## 2019-04-04 ENCOUNTER — Encounter (INDEPENDENT_AMBULATORY_CARE_PROVIDER_SITE_OTHER): Payer: Medicare Other | Admitting: Ophthalmology

## 2019-04-04 DIAGNOSIS — H353112 Nonexudative age-related macular degeneration, right eye, intermediate dry stage: Secondary | ICD-10-CM

## 2019-04-04 DIAGNOSIS — I10 Essential (primary) hypertension: Secondary | ICD-10-CM | POA: Diagnosis not present

## 2019-04-04 DIAGNOSIS — Z79899 Other long term (current) drug therapy: Secondary | ICD-10-CM

## 2019-04-04 DIAGNOSIS — K754 Autoimmune hepatitis: Secondary | ICD-10-CM | POA: Diagnosis not present

## 2019-04-04 DIAGNOSIS — H35033 Hypertensive retinopathy, bilateral: Secondary | ICD-10-CM

## 2019-04-04 DIAGNOSIS — H348122 Central retinal vein occlusion, left eye, stable: Secondary | ICD-10-CM

## 2019-04-04 DIAGNOSIS — H43813 Vitreous degeneration, bilateral: Secondary | ICD-10-CM

## 2019-04-04 LAB — COMPREHENSIVE METABOLIC PANEL
ALT: 164 U/L — ABNORMAL HIGH (ref 0–53)
AST: 240 U/L — ABNORMAL HIGH (ref 0–37)
Albumin: 2.7 g/dL — ABNORMAL LOW (ref 3.5–5.2)
Alkaline Phosphatase: 173 U/L — ABNORMAL HIGH (ref 39–117)
BUN: 15 mg/dL (ref 6–23)
CO2: 25 mEq/L (ref 19–32)
Calcium: 8.5 mg/dL (ref 8.4–10.5)
Chloride: 100 mEq/L (ref 96–112)
Creatinine, Ser: 0.94 mg/dL (ref 0.40–1.50)
GFR: 92.96 mL/min (ref >60.00)
Glucose, Bld: 71 mg/dL (ref 70–99)
Potassium: 3.5 mEq/L (ref 3.5–5.1)
Sodium: 129 mEq/L — ABNORMAL LOW (ref 135–145)
Total Bilirubin: 1.5 mg/dL — ABNORMAL HIGH (ref 0.2–1.2)
Total Protein: 10 g/dL — ABNORMAL HIGH (ref 6.0–8.3)

## 2019-04-23 ENCOUNTER — Other Ambulatory Visit: Payer: Self-pay | Admitting: Family Medicine

## 2019-04-28 ENCOUNTER — Other Ambulatory Visit: Payer: Self-pay | Admitting: Family Medicine

## 2019-05-24 ENCOUNTER — Other Ambulatory Visit (HOSPITAL_COMMUNITY): Payer: Self-pay | Admitting: Nurse Practitioner

## 2019-05-24 ENCOUNTER — Other Ambulatory Visit: Payer: Self-pay | Admitting: Nurse Practitioner

## 2019-05-24 DIAGNOSIS — K7469 Other cirrhosis of liver: Secondary | ICD-10-CM

## 2019-05-26 ENCOUNTER — Encounter (HOSPITAL_COMMUNITY): Payer: Self-pay | Admitting: Emergency Medicine

## 2019-05-26 ENCOUNTER — Emergency Department (HOSPITAL_COMMUNITY): Payer: Medicare Other

## 2019-05-26 ENCOUNTER — Inpatient Hospital Stay (HOSPITAL_COMMUNITY)
Admission: EM | Admit: 2019-05-26 | Discharge: 2019-05-30 | DRG: 871 | Disposition: A | Discharge status: Home / Self Care | Payer: Medicare Other | Attending: Family Medicine | Admitting: Family Medicine

## 2019-05-26 ENCOUNTER — Other Ambulatory Visit: Payer: Self-pay

## 2019-05-26 DIAGNOSIS — H409 Unspecified glaucoma: Secondary | ICD-10-CM | POA: Diagnosis present

## 2019-05-26 DIAGNOSIS — Z86010 Personal history of colonic polyps: Secondary | ICD-10-CM

## 2019-05-26 DIAGNOSIS — I10 Essential (primary) hypertension: Secondary | ICD-10-CM | POA: Diagnosis present

## 2019-05-26 DIAGNOSIS — K754 Autoimmune hepatitis: Secondary | ICD-10-CM | POA: Diagnosis present

## 2019-05-26 DIAGNOSIS — Z23 Encounter for immunization: Secondary | ICD-10-CM

## 2019-05-26 DIAGNOSIS — A419 Sepsis, unspecified organism: Secondary | ICD-10-CM | POA: Diagnosis not present

## 2019-05-26 DIAGNOSIS — M7989 Other specified soft tissue disorders: Secondary | ICD-10-CM

## 2019-05-26 DIAGNOSIS — E860 Dehydration: Secondary | ICD-10-CM | POA: Diagnosis present

## 2019-05-26 DIAGNOSIS — Z20828 Contact with and (suspected) exposure to other viral communicable diseases: Secondary | ICD-10-CM | POA: Diagnosis present

## 2019-05-26 DIAGNOSIS — H3530 Unspecified macular degeneration: Secondary | ICD-10-CM | POA: Diagnosis present

## 2019-05-26 DIAGNOSIS — Z79899 Other long term (current) drug therapy: Secondary | ICD-10-CM

## 2019-05-26 DIAGNOSIS — H2010 Chronic iridocyclitis, unspecified eye: Secondary | ICD-10-CM | POA: Diagnosis present

## 2019-05-26 DIAGNOSIS — M109 Gout, unspecified: Secondary | ICD-10-CM | POA: Diagnosis present

## 2019-05-26 DIAGNOSIS — J189 Pneumonia, unspecified organism: Secondary | ICD-10-CM | POA: Diagnosis present

## 2019-05-26 DIAGNOSIS — F329 Major depressive disorder, single episode, unspecified: Secondary | ICD-10-CM | POA: Diagnosis present

## 2019-05-26 DIAGNOSIS — M1710 Unilateral primary osteoarthritis, unspecified knee: Secondary | ICD-10-CM | POA: Diagnosis present

## 2019-05-26 DIAGNOSIS — R7989 Other specified abnormal findings of blood chemistry: Secondary | ICD-10-CM | POA: Diagnosis present

## 2019-05-26 DIAGNOSIS — Z7952 Long term (current) use of systemic steroids: Secondary | ICD-10-CM

## 2019-05-26 LAB — CBC WITH DIFFERENTIAL/PLATELET
Abs Immature Granulocytes: 0.04 10*3/uL (ref 0.00–0.07)
Basophils Absolute: 0.1 10*3/uL (ref 0.0–0.1)
Basophils Relative: 1 %
Eosinophils Absolute: 0 10*3/uL (ref 0.0–0.5)
Eosinophils Relative: 0 %
HCT: 39.5 % (ref 39.0–52.0)
Hemoglobin: 13.4 g/dL (ref 13.0–17.0)
Immature Granulocytes: 1 %
Lymphocytes Relative: 16 %
Lymphs Abs: 1.3 10*3/uL (ref 0.7–4.0)
MCH: 31.3 pg (ref 26.0–34.0)
MCHC: 33.9 g/dL (ref 30.0–36.0)
MCV: 92.3 fL (ref 80.0–100.0)
Monocytes Absolute: 0.5 10*3/uL (ref 0.1–1.0)
Monocytes Relative: 6 %
Neutro Abs: 6.2 10*3/uL (ref 1.7–7.7)
Neutrophils Relative %: 76 %
Platelets: 156 10*3/uL (ref 150–400)
RBC: 4.28 MIL/uL (ref 4.22–5.81)
RDW: 15.7 % — ABNORMAL HIGH (ref 11.5–15.5)
WBC: 8.1 10*3/uL (ref 4.0–10.5)
nRBC: 0 % (ref 0.0–0.2)

## 2019-05-26 LAB — COMPREHENSIVE METABOLIC PANEL
ALT: 33 U/L (ref 0–44)
AST: 45 U/L — ABNORMAL HIGH (ref 15–41)
Albumin: 2.4 g/dL — ABNORMAL LOW (ref 3.5–5.0)
Alkaline Phosphatase: 66 U/L (ref 38–126)
Anion gap: 9 (ref 5–15)
BUN: 16 mg/dL (ref 8–23)
CO2: 27 mmol/L (ref 22–32)
Calcium: 7.7 mg/dL — ABNORMAL LOW (ref 8.9–10.3)
Chloride: 95 mmol/L — ABNORMAL LOW (ref 98–111)
Creatinine, Ser: 1.33 mg/dL — ABNORMAL HIGH (ref 0.61–1.24)
GFR calc Af Amer: 57 mL/min — ABNORMAL LOW (ref >60)
GFR calc non Af Amer: 49 mL/min — ABNORMAL LOW (ref >60)
Glucose, Bld: 103 mg/dL — ABNORMAL HIGH (ref 70–99)
Potassium: 3.6 mmol/L (ref 3.5–5.1)
Sodium: 131 mmol/L — ABNORMAL LOW (ref 135–145)
Total Bilirubin: 1.6 mg/dL — ABNORMAL HIGH (ref 0.3–1.2)
Total Protein: 6.6 g/dL (ref 6.5–8.1)

## 2019-05-26 LAB — PROTIME-INR
INR: 1.3 — ABNORMAL HIGH (ref 0.8–1.2)
Prothrombin Time: 15.8 seconds — ABNORMAL HIGH (ref 11.4–15.2)

## 2019-05-26 LAB — LACTIC ACID, PLASMA
Lactic Acid, Venous: 3.1 mmol/L (ref 0.5–1.9)
Lactic Acid, Venous: 2 mmol/L (ref 0.5–1.9)

## 2019-05-26 LAB — SARS CORONAVIRUS 2 BY RT PCR (HOSPITAL ORDER, PERFORMED IN ~~LOC~~ HOSPITAL LAB): SARS Coronavirus 2: NEGATIVE

## 2019-05-26 LAB — URINALYSIS, ROUTINE W REFLEX MICROSCOPIC
Bilirubin Urine: NEGATIVE
Glucose, UA: NEGATIVE mg/dL
Hgb urine dipstick: NEGATIVE
Ketones, ur: NEGATIVE mg/dL
Leukocytes,Ua: NEGATIVE
Nitrite: NEGATIVE
Protein, ur: NEGATIVE mg/dL
Specific Gravity, Urine: 1.017 (ref 1.005–1.030)
pH: 5 (ref 5.0–8.0)

## 2019-05-26 MED ORDER — SODIUM CHLORIDE 0.9 % IV BOLUS
2850.0000 mL | Freq: Once | INTRAVENOUS | Status: AC
  Administered 2019-05-26: 2850 mL via INTRAVENOUS

## 2019-05-26 MED ORDER — SODIUM CHLORIDE 0.9 % IV SOLN
500.0000 mg | INTRAVENOUS | Status: DC
  Administered 2019-05-26 – 2019-05-29 (×4): 500 mg via INTRAVENOUS
  Filled 2019-05-26 (×4): qty 500

## 2019-05-26 MED ORDER — SODIUM CHLORIDE 0.9 % IV SOLN
2.0000 g | INTRAVENOUS | Status: DC
  Administered 2019-05-26: 2 g via INTRAVENOUS
  Filled 2019-05-26: qty 20

## 2019-05-26 MED ORDER — ACETAMINOPHEN 325 MG PO TABS
650.0000 mg | ORAL_TABLET | Freq: Once | ORAL | Status: AC
  Administered 2019-05-26: 650 mg via ORAL
  Filled 2019-05-26: qty 2

## 2019-05-26 NOTE — ED Notes (Signed)
Pt aware a urine specimen is needed. Pt will notify staff when one can be obtained.  

## 2019-05-26 NOTE — ED Triage Notes (Signed)
During triage assessment.  Stanley 120's and Temp 103.2.  EKG done and given to Dr Lacinda Axon.

## 2019-05-26 NOTE — ED Provider Notes (Signed)
[facility] EMERGENCY DEPARTMENT Provider Note   CSN: [id number] Arrival date & time: 05/26/19  1702     History   Chief Complaint Chief Complaint  Patient presents with  . Knee Pain    left    HPI RAHSAAN HIPPLER is a 84 y.o. male.     The history is provided by the spouse and the EMS personnel. No language interpreter was used.  Fever Max temp prior to arrival:  103 Temp source:  Oral Severity:  Moderate Onset quality:  Gradual Timing:  Constant Progression:  Worsening Chronicity:  New Relieved by:  Nothing Worsened by:  Nothing Ineffective treatments:  None tried Associated symptoms: myalgias   Associated symptoms: no cough and no nausea   Risk factors: no recent sickness and no sick contacts     Past Medical History:  Diagnosis Date  . Arthritis   . Cataract    ight eye and has been removed  . Colon polyps   . Depression   . Glaucoma   . HTN (hypertension)   . Macular degeneration     Patient Active Problem List   Diagnosis Date Noted  . Gallstone 03/23/2019  . Autoimmune hepatitis ([place]) 09/22/2018  . Transaminasemia 03/22/2018  . Positive hepatitis C antibody test 03/22/2018  . 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 Gout 10/31/2015  . Essential hypertension 10/31/2015  . Glaucoma 03/06/2015    Past Surgical History:  Procedure Laterality Date  . CATARACT EXTRACTION Right   . COLONOSCOPY    . MULTIPLE TOOTH EXTRACTIONS     pt weard upper & lower dentures  . POLYPECTOMY    . UPPER GI ENDOSCOPY  09/21/2018        Home Medications    Prior to Admission medications   Medication Sig Start Date End Date Taking? Authorizing Provider  amLODipine (NORVASC) 5 MG tablet TAKE ONE (1) TABLET EACH DAY Patient taking differently: Take 5 mg by mouth daily. TAKE ONE (1) TABLET EACH DAY 09/22/18  Yes Burchette, Alinda Sierras, MD  azaTHIOprine (IMURAN) 50 MG tablet Take 50 mg by mouth daily.   Yes [provider]  furosemide (LASIX) 40 MG tablet TAKE 1 AND  1/2 TABLETS DAILY Patient taking differently: Take 60 mg by mouth daily. TAKE 1 AND 1/2 TABLETS DAILY 04/25/19  Yes Burchette, Alinda Sierras, MD  ketorolac (ACULAR) 0.5 % ophthalmic solution Place 1 drop into the right eye 2 (two) times daily.  09/17/17  Yes [provider]  Multiple Vitamins-Minerals (EYE VITAMINS PO) Take 2 capsules by mouth daily.    Yes [provider]  potassium chloride (K-DUR) 10 MEQ tablet TAKE ONE (1) TABLET EACH DAY Patient taking differently: Take 10 mEq by mouth.  04/29/19  Yes Burchette, Alinda Sierras, MD  predniSONE (DELTASONE) 10 MG tablet Take 10 mg by mouth daily with breakfast.   Yes [provider]  valACYclovir (VALTREX) 1000 MG tablet Take 1,000 mg by mouth daily.  01/31/19   [provider]    Family History Family History  Problem Relation Age of Onset  . Ovarian cancer Mother   . Stomach cancer Mother   . Dementia Father   . Alcohol abuse Cousin   . Thyroid cancer Son   . Other Grandchild        Primary Sclerosing Cholangitis  . Colon cancer Neg Hx   . Esophageal cancer Neg Hx   . Rectal cancer Neg Hx   . Pancreatic cancer Neg Hx   . Prostate cancer  Neg Hx     Social History Social History   Tobacco Use  . Smoking status: Former Smoker    Packs/day: 1.00    Years: 10.00    Pack years: 10.00    Types: Cigarettes    Quit date: 08/18/1990    Years since quitting: 28.7  . Smokeless tobacco: Never Used  Substance Use Topics  . Alcohol use: No  . Drug use: No     Allergies   Patient has no known allergies.   Review of Systems Review of Systems  Constitutional: Positive for fever.  Respiratory: Negative for cough.   Gastrointestinal: Negative for nausea.  Musculoskeletal: Positive for myalgias.  All other systems reviewed and are negative.    Physical Exam Updated Vital Signs BP 136/75   Pulse 99   Temp (!) 103.2 F (39.6 C) (Oral)   Resp 18   Ht 5\' 8"  (1.727 m)   Wt 95.3 kg   SpO2 97%   BMI 31.93  kg/m   Physical Exam Vitals signs and nursing note reviewed.  Constitutional:      Appearance: He is well-developed.  HENT:     Head: Normocephalic and atraumatic.     Nose: Nose normal.     Mouth/Throat:     Mouth: Mucous membranes are moist.  Eyes:     Conjunctiva/sclera: Conjunctivae normal.  Neck:     Musculoskeletal: Neck supple.  Cardiovascular:     Rate and Rhythm: Normal rate and regular rhythm.     Heart sounds: No murmur.  Pulmonary:     Effort: Pulmonary effort is normal. No respiratory distress.     Breath sounds: Rhonchi present.  Abdominal:     Palpations: Abdomen is soft.     Tenderness: There is no abdominal tenderness.  Musculoskeletal: Normal range of motion.        General: No swelling.  Skin:    General: Skin is warm and dry.  Neurological:     General: No focal deficit present.     Mental Status: He is alert.  Psychiatric:        Mood and Affect: Mood normal.      ED Treatments / Results  Labs (all labs ordered are listed, but only abnormal results are displayed) Labs Reviewed  COMPREHENSIVE METABOLIC PANEL - Abnormal; Notable for the following components:      Result Value   Sodium 131 (*)    Chloride 95 (*)    Glucose, Bld 103 (*)    Creatinine, Ser 1.33 (*)    Calcium 7.7 (*)    Albumin 2.4 (*)    AST 45 (*)    Total Bilirubin 1.6 (*)    GFR calc non Af Amer 49 (*)    GFR calc Af Amer 57 (*)    All other components within normal limits  LACTIC ACID, PLASMA - Abnormal; Notable for the following components:   Lactic Acid, Venous 3.1 (*)    All other components within normal limits  CBC WITH DIFFERENTIAL/PLATELET - Abnormal; Notable for the following components:   RDW 15.7 (*)    All other components within normal limits  PROTIME-INR - Abnormal; Notable for the following components:   Prothrombin Time 15.8 (*)    INR 1.3 (*)    All other components within normal limits  CULTURE, BLOOD (ROUTINE X 2)  CULTURE, BLOOD (ROUTINE X 2)   SARS CORONAVIRUS 2 (TAT 6-24 HRS)  LACTIC ACID, PLASMA  URINALYSIS, ROUTINE W REFLEX MICROSCOPIC  EKG None  Radiology Dg Chest Portable 1 View  Result Date: 05/26/2019 CLINICAL DATA:  Fever, weakness EXAM: PORTABLE CHEST 1 VIEW COMPARISON:  10/05/2017 FINDINGS: The heart size and mediastinal contours are within normal limits. Mild bilateral perihilar and bibasilar interstitial opacities. No large pleural fluid collection. No pneumothorax. IMPRESSION: Mild bilateral perihilar and bibasilar interstitial opacities, right greater than left, which could represent an atypical and/or viral pneumonia. Electronically Signed   By: Davina Poke M.D.   On: 05/26/2019 19:20   Dg Knee Complete 4 Views Left  Result Date: 05/26/2019 CLINICAL DATA:  Knee stiffness EXAM: LEFT KNEE - COMPLETE 4+ VIEW COMPARISON:  None. FINDINGS: No acute fracture or malalignment. Mild tricompartmental osteoarthritis. Small enthesophyte at the quadriceps tendon insertion. No knee joint effusion. No acute soft tissue findings. IMPRESSION: No acute osseous abnormality, left knee. Electronically Signed   By: Davina Poke M.D.   On: 05/26/2019 19:21    Procedures .Critical Care Performed by: Fransico Meadow, PA-C Authorized by: Fransico Meadow, PA-C   Critical care provider statement:    Critical care time (minutes):  45   Critical care start time:  05/26/2019 7:16 PM   Critical care end time:  05/26/2019 10:16 PM   Critical care time was exclusive of:  Separately billable procedures and treating other patients and teaching time   Critical care was necessary to treat or prevent imminent or life-threatening deterioration of the following conditions:  Cardiac failure, circulatory failure, dehydration and respiratory failure   Critical care was time spent personally by me on the following activities:  Discussions with consultants, evaluation of patient's response to treatment, examination of patient, ordering and  performing treatments and interventions, ordering and review of laboratory studies, ordering and review of radiographic studies, pulse oximetry, re-evaluation of patient's condition, obtaining history from patient or surrogate and review of old charts   I assumed direction of critical care for this patient from another provider in my specialty: no     (including critical care time)  Medications Ordered in ED Medications  cefTRIAXone (ROCEPHIN) 2 g in sodium chloride 0.9 % 100 mL IVPB (2 g Intravenous New Bag/Given 05/26/19 2032)  azithromycin (ZITHROMAX) 500 mg in sodium chloride 0.9 % 250 mL IVPB (has no administration in time range)  sodium chloride 0.9 % bolus 2,850 mL (2,850 mLs Intravenous New Bag/Given 05/26/19 2029)  acetaminophen (TYLENOL) tablet 650 mg (650 mg Oral Given 05/26/19 2039)     Initial Impression / Assessment and Plan / ED Course  I have reviewed the triage vital signs and the nursing notes.  Pertinent labs & imaging results that were available during my care of the patient were reviewed by me and considered in my medical decision making (see chart for details).      .  MDM  Pt given Iv fluid bolus 30mg /kg.  Chest xray shows pneumonia.  Pt has an elevated lactic acid.  Pt given Rocephin and zithromax IV. Pt given tylenol for fever.  Covid test ordered.   I spoke to Dr. Darrick Meigs who will admit   Final Clinical Impressions(s) / ED Diagnoses   Final diagnoses:  Community acquired pneumonia, unspecified laterality  Sepsis, due to unspecified organism, unspecified whether acute organ dysfunction present [facility])    ED Discharge Orders    None       Sidney Ace 05/26/19 2217    Nat Christen, MD 05/30/19 1148

## 2019-05-26 NOTE — ED Triage Notes (Signed)
EKG shows ST with PVC's

## 2019-05-26 NOTE — ED Triage Notes (Signed)
Brought in by EMS.  C/o left knee stiffness, denies pain.

## 2019-05-27 ENCOUNTER — Inpatient Hospital Stay (HOSPITAL_COMMUNITY): Payer: Medicare Other

## 2019-05-27 ENCOUNTER — Other Ambulatory Visit: Payer: Self-pay

## 2019-05-27 ENCOUNTER — Encounter (HOSPITAL_COMMUNITY): Payer: Self-pay

## 2019-05-27 DIAGNOSIS — H2010 Chronic iridocyclitis, unspecified eye: Secondary | ICD-10-CM | POA: Diagnosis present

## 2019-05-27 DIAGNOSIS — Z23 Encounter for immunization: Secondary | ICD-10-CM | POA: Diagnosis present

## 2019-05-27 DIAGNOSIS — Z79899 Other long term (current) drug therapy: Secondary | ICD-10-CM | POA: Diagnosis not present

## 2019-05-27 DIAGNOSIS — I10 Essential (primary) hypertension: Secondary | ICD-10-CM | POA: Diagnosis present

## 2019-05-27 DIAGNOSIS — R7989 Other specified abnormal findings of blood chemistry: Secondary | ICD-10-CM | POA: Diagnosis not present

## 2019-05-27 DIAGNOSIS — Z86010 Personal history of colonic polyps: Secondary | ICD-10-CM | POA: Diagnosis not present

## 2019-05-27 DIAGNOSIS — J189 Pneumonia, unspecified organism: Secondary | ICD-10-CM | POA: Diagnosis present

## 2019-05-27 DIAGNOSIS — H409 Unspecified glaucoma: Secondary | ICD-10-CM | POA: Diagnosis present

## 2019-05-27 DIAGNOSIS — Z7952 Long term (current) use of systemic steroids: Secondary | ICD-10-CM | POA: Diagnosis not present

## 2019-05-27 DIAGNOSIS — M1710 Unilateral primary osteoarthritis, unspecified knee: Secondary | ICD-10-CM | POA: Diagnosis present

## 2019-05-27 DIAGNOSIS — M109 Gout, unspecified: Secondary | ICD-10-CM | POA: Diagnosis present

## 2019-05-27 DIAGNOSIS — Z20828 Contact with and (suspected) exposure to other viral communicable diseases: Secondary | ICD-10-CM | POA: Diagnosis present

## 2019-05-27 DIAGNOSIS — A419 Sepsis, unspecified organism: Secondary | ICD-10-CM | POA: Diagnosis present

## 2019-05-27 DIAGNOSIS — M7989 Other specified soft tissue disorders: Secondary | ICD-10-CM | POA: Diagnosis present

## 2019-05-27 DIAGNOSIS — E860 Dehydration: Secondary | ICD-10-CM | POA: Diagnosis present

## 2019-05-27 DIAGNOSIS — H3530 Unspecified macular degeneration: Secondary | ICD-10-CM | POA: Diagnosis present

## 2019-05-27 DIAGNOSIS — K754 Autoimmune hepatitis: Secondary | ICD-10-CM | POA: Diagnosis present

## 2019-05-27 DIAGNOSIS — F329 Major depressive disorder, single episode, unspecified: Secondary | ICD-10-CM | POA: Diagnosis present

## 2019-05-27 LAB — CBC
HCT: 34.6 % — ABNORMAL LOW (ref 39.0–52.0)
Hemoglobin: 11.5 g/dL — ABNORMAL LOW (ref 13.0–17.0)
MCH: 31.1 pg (ref 26.0–34.0)
MCHC: 33.2 g/dL (ref 30.0–36.0)
MCV: 93.5 fL (ref 80.0–100.0)
Platelets: 108 10*3/uL — ABNORMAL LOW (ref 150–400)
RBC: 3.7 MIL/uL — ABNORMAL LOW (ref 4.22–5.81)
RDW: 15.8 % — ABNORMAL HIGH (ref 11.5–15.5)
WBC: 4.5 10*3/uL (ref 4.0–10.5)
nRBC: 0 % (ref 0.0–0.2)

## 2019-05-27 LAB — COMPREHENSIVE METABOLIC PANEL
ALT: 28 U/L (ref 0–44)
AST: 36 U/L (ref 15–41)
Albumin: 2 g/dL — ABNORMAL LOW (ref 3.5–5.0)
Alkaline Phosphatase: 58 U/L (ref 38–126)
Anion gap: 8 (ref 5–15)
BUN: 14 mg/dL (ref 8–23)
CO2: 25 mmol/L (ref 22–32)
Calcium: 7.3 mg/dL — ABNORMAL LOW (ref 8.9–10.3)
Chloride: 100 mmol/L (ref 98–111)
Creatinine, Ser: 1.08 mg/dL (ref 0.61–1.24)
GFR calc Af Amer: >60 mL/min (ref >60)
GFR calc non Af Amer: >60 mL/min (ref >60)
Glucose, Bld: 84 mg/dL (ref 70–99)
Potassium: 3.5 mmol/L (ref 3.5–5.1)
Sodium: 133 mmol/L — ABNORMAL LOW (ref 135–145)
Total Bilirubin: 1.3 mg/dL — ABNORMAL HIGH (ref 0.3–1.2)
Total Protein: 5.5 g/dL — ABNORMAL LOW (ref 6.5–8.1)

## 2019-05-27 LAB — LACTIC ACID, PLASMA
Lactic Acid, Venous: 2.8 mmol/L (ref 0.5–1.9)
Lactic Acid, Venous: 2.4 mmol/L (ref 0.5–1.9)

## 2019-05-27 MED ORDER — SODIUM CHLORIDE 0.9 % IV BOLUS
1000.0000 mL | Freq: Once | INTRAVENOUS | Status: AC
  Administered 2019-05-27: 1000 mL via INTRAVENOUS

## 2019-05-27 MED ORDER — INFLUENZA VAC A&B SA ADJ QUAD 0.5 ML IM PRSY: 0.5000 mL | PREFILLED_SYRINGE | INTRAMUSCULAR | Status: DC

## 2019-05-27 MED ORDER — ALUM & MAG HYDROXIDE-SIMETH 200-200-20 MG/5ML PO SUSP: 30.0000 mL | ORAL | Status: DC | PRN

## 2019-05-27 MED ORDER — PREDNISONE 10 MG PO TABS
10.0000 mg | ORAL_TABLET | Freq: Every day | ORAL | Status: DC
  Administered 2019-05-27 – 2019-05-30 (×3): 10 mg via ORAL
  Filled 2019-05-27 (×4): qty 1

## 2019-05-27 MED ORDER — ONDANSETRON HCL 4 MG PO TABS: 4.0000 mg | ORAL_TABLET | Freq: Four times a day (QID) | ORAL | Status: DC | PRN

## 2019-05-27 MED ORDER — SODIUM CHLORIDE 0.9 % IV SOLN
INTRAVENOUS | Status: DC
  Administered 2019-05-27 – 2019-05-29 (×3): via INTRAVENOUS

## 2019-05-27 MED ORDER — AMLODIPINE BESYLATE 5 MG PO TABS
5.0000 mg | ORAL_TABLET | Freq: Every day | ORAL | Status: DC
  Administered 2019-05-27 – 2019-05-30 (×3): 5 mg via ORAL
  Filled 2019-05-27 (×4): qty 1

## 2019-05-27 MED ORDER — ACETAMINOPHEN 325 MG PO TABS
650.0000 mg | ORAL_TABLET | Freq: Four times a day (QID) | ORAL | Status: DC | PRN
  Administered 2019-05-27: 650 mg via ORAL
  Filled 2019-05-27: qty 2

## 2019-05-27 MED ORDER — ENOXAPARIN SODIUM 40 MG/0.4ML ~~LOC~~ SOLN
40.0000 mg | SUBCUTANEOUS | Status: DC
  Administered 2019-05-27 – 2019-05-30 (×4): 40 mg via SUBCUTANEOUS
  Filled 2019-05-27 (×4): qty 0.4

## 2019-05-27 MED ORDER — SODIUM CHLORIDE 0.9 % IV SOLN
1.0000 g | INTRAVENOUS | Status: DC
  Administered 2019-05-27 – 2019-05-29 (×3): 1 g via INTRAVENOUS
  Filled 2019-05-27 (×4): qty 10

## 2019-05-27 MED ORDER — AZATHIOPRINE 50 MG PO TABS
50.0000 mg | ORAL_TABLET | Freq: Every day | ORAL | Status: DC
  Administered 2019-05-27 – 2019-05-30 (×3): 50 mg via ORAL
  Filled 2019-05-27 (×6): qty 1

## 2019-05-27 MED ORDER — VALACYCLOVIR HCL 500 MG PO TABS
1000.0000 mg | ORAL_TABLET | Freq: Every day | ORAL | Status: DC
  Administered 2019-05-27 – 2019-05-30 (×3): 1000 mg via ORAL
  Filled 2019-05-27 (×4): qty 2

## 2019-05-27 MED ORDER — SODIUM CHLORIDE 0.9 % IV SOLN: INTRAVENOUS | Status: DC

## 2019-05-27 MED ORDER — KETOROLAC TROMETHAMINE 0.5 % OP SOLN
1.0000 [drp] | Freq: Two times a day (BID) | OPHTHALMIC | Status: DC
  Administered 2019-05-27 – 2019-05-30 (×7): 1 [drp] via OPHTHALMIC
  Filled 2019-05-27: qty 3

## 2019-05-27 MED ORDER — ACETAMINOPHEN 650 MG RE SUPP: 650.0000 mg | Freq: Four times a day (QID) | RECTAL | Status: DC | PRN

## 2019-05-27 MED ORDER — ONDANSETRON HCL 4 MG/2ML IJ SOLN: 4.0000 mg | Freq: Four times a day (QID) | INTRAMUSCULAR | Status: DC | PRN

## 2019-05-27 NOTE — Progress Notes (Signed)
CRITICAL VALUE ALERT  Critical Value:  Lactic Acid 2.8  Date & Time Notied:  05/27/19 1837  Provider Notified: MD Wynetta Emery  Orders Received/Actions taken: NS Bolus and continuous IVF ordered

## 2019-05-27 NOTE — H&P (Addendum)
TRH H&P    Patient Demographics:    Ethan Anderson, is a 84 y.o. male  MRN: [id number]  DOB - 11/18/1934  Admit Date - 05/26/2019  Referring MD/NP/PA: Dr. Roderic Palau  Outpatient Primary MD for the patient is Eulas Post, MD  Patient coming from: Home  Chief complaint-fever and chills   HPI:    Ethan Anderson  is a 84 y.o. male, with history of gallstones, autoimmune hepatitis, gout, hypertension who was brought to the ED by EMS for generalized weakness, chills and fever at home.  As per patient wife patient started shivering while talking on the phone and became very weak.  Temperature 100.5 at home.  No history of nausea vomiting or diarrhea.  No chest pain or shortness of breath.  Denies coughing up any phlegm. In the ED chest x-ray showed bilateral pneumonia. Patient started on ceftriaxone and Zithromax.    Review of systems:    In addition to the HPI above,    All other systems reviewed and are negative.    Past History of the following :    Past Medical History:  Diagnosis Date   Arthritis    Cataract    ight eye and has been removed   Colon polyps    Depression    Glaucoma    HTN (hypertension)    Macular degeneration       Past Surgical History:  Procedure Laterality Date   CATARACT EXTRACTION Right    COLONOSCOPY     MULTIPLE TOOTH EXTRACTIONS     pt weard upper & lower dentures   POLYPECTOMY     UPPER GI ENDOSCOPY  09/21/2018      Social History:      Social History   Tobacco Use   Smoking status: Former Smoker    Packs/day: 1.00    Years: 10.00    Pack years: 10.00    Types: Cigarettes    Quit date: 08/18/1990    Years since quitting: 28.7   Smokeless tobacco: Never Used  Substance Use Topics   Alcohol use: No       Family History :     Family History  Problem Relation Age of Onset   Ovarian cancer Mother    Stomach cancer Mother     Dementia Father    Alcohol abuse Cousin    Thyroid cancer Son    Other Grandchild        Primary Sclerosing Cholangitis   Colon cancer Neg Hx    Esophageal cancer Neg Hx    Rectal cancer Neg Hx    Pancreatic cancer Neg Hx    Prostate cancer Neg Hx       Home Medications:   Prior to Admission medications   Medication Sig Start Date End Date Taking? Authorizing Provider  amLODipine (NORVASC) 5 MG tablet TAKE ONE (1) TABLET EACH DAY Patient taking differently: Take 5 mg by mouth daily. TAKE ONE (1) TABLET EACH DAY 09/22/18  Yes Burchette, Alinda Sierras, MD  azaTHIOprine (IMURAN) 50 MG tablet Take 50 mg  by mouth daily.   Yes [provider]  furosemide (LASIX) 40 MG tablet TAKE 1 AND 1/2 TABLETS DAILY Patient taking differently: Take 60 mg by mouth daily. TAKE 1 AND 1/2 TABLETS DAILY 04/25/19  Yes Burchette, Alinda Sierras, MD  ketorolac (ACULAR) 0.5 % ophthalmic solution Place 1 drop into the right eye 2 (two) times daily.  09/17/17  Yes [provider]  Multiple Vitamins-Minerals (EYE VITAMINS PO) Take 2 capsules by mouth daily.    Yes [provider]  potassium chloride (K-DUR) 10 MEQ tablet TAKE ONE (1) TABLET EACH DAY Patient taking differently: Take 10 mEq by mouth.  04/29/19  Yes Burchette, Alinda Sierras, MD  predniSONE (DELTASONE) 10 MG tablet Take 10 mg by mouth daily with breakfast.   Yes [provider]  valACYclovir (VALTREX) 1000 MG tablet Take 1,000 mg by mouth daily.  01/31/19   [provider]     Allergies:    No Known Allergies   Physical Exam:   Vitals  Blood pressure 101/62, pulse 71, temperature 98.1 F (36.7 C), temperature source Oral, resp. rate 11, height 5\' 8"  (1.727 m), weight 95.3 kg, SpO2 97 %.  1.  General: Appears in no acute distress  2. Psychiatric:  Alert, oriented x3, intact insight and judgment  3. Neurologic: Cranial nerves II to XII grossly intact, motor strength 5/5 in all extremities  4. HEENMT:   Atraumatic normocephalic, extraocular muscles are intact  5. Respiratory : Clear to auscultation bilaterally, no wheezing or crackles  6. Cardiovascular : S1-S2, regular, no murmur auscultated  7. Gastrointestinal:  Abdomen is soft, nontender, no organomegaly  8. Skin:  Right thigh is mildly edematous, warm to touch      Data Review:    CBC Recent Labs  Lab 05/26/19 1802  WBC 8.1  HGB 13.4  HCT 39.5  PLT 156  MCV 92.3  MCH 31.3  MCHC 33.9  RDW 15.7*  LYMPHSABS 1.3  MONOABS 0.5  EOSABS 0.0  BASOSABS 0.1   ------------------------------------------------------------------------------------------------------------------  Results for orders placed or performed during the hospital encounter of 05/26/19 (from the past 48 hour(s))  Comprehensive metabolic panel     Status: Abnormal   Collection Time: 05/26/19  6:02 PM  Result Value Ref Range   Sodium 131 (L) 135 - 145 mmol/L   Potassium 3.6 3.5 - 5.1 mmol/L   Chloride 95 (L) 98 - 111 mmol/L   CO2 27 22 - 32 mmol/L   Glucose, Bld 103 (H) 70 - 99 mg/dL   BUN 16 8 - 23 mg/dL   Creatinine, Ser 1.33 (H) 0.61 - 1.24 mg/dL   Calcium 7.7 (L) 8.9 - 10.3 mg/dL   Total Protein 6.6 6.5 - 8.1 g/dL   Albumin 2.4 (L) 3.5 - 5.0 g/dL   AST 45 (H) 15 - 41 U/L   ALT 33 0 - 44 U/L   Alkaline Phosphatase 66 38 - 126 U/L   Total Bilirubin 1.6 (H) 0.3 - 1.2 mg/dL   GFR calc non Af Amer 49 (L) >60 mL/min   GFR calc Af Amer 57 (L) >60 mL/min   Anion gap 9 5 - 15    Comment: Performed at [facility], [address], [place], [place] [address]  Lactic acid, plasma     Status: Abnormal   Collection Time: 05/26/19  6:02 PM  Result Value Ref Range   Lactic Acid, Venous 3.1 (HH) 0.5 - 1.9 mmol/L    Comment: CRITICAL RESULT CALLED TO, READ  BACK BY AND VERIFIED WITH: STEINHAUER,C ON 05/26/19 AT 1855 BY LOY,C Performed at [facility], [address] [place], [place] [address]   CBC with Differential     Status: Abnormal   Collection  Time: 05/26/19  6:02 PM  Result Value Ref Range   WBC 8.1 4.0 - 10.5 K/uL   RBC 4.28 4.22 - 5.81 MIL/uL   Hemoglobin 13.4 13.0 - 17.0 g/dL   HCT 39.5 39.0 - 52.0 %   MCV 92.3 80.0 - 100.0 fL   MCH 31.3 26.0 - 34.0 pg   MCHC 33.9 30.0 - 36.0 g/dL   RDW 15.7 (H) 11.5 - 15.5 %   Platelets 156 150 - 400 K/uL   nRBC 0.0 0.0 - 0.2 %   Neutrophils Relative % 76 %   Neutro Abs 6.2 1.7 - 7.7 K/uL   Lymphocytes Relative 16 %   Lymphs Abs 1.3 0.7 - 4.0 K/uL   Monocytes Relative 6 %   Monocytes Absolute 0.5 0.1 - 1.0 K/uL   Eosinophils Relative 0 %   Eosinophils Absolute 0.0 0.0 - 0.5 K/uL   Basophils Relative 1 %   Basophils Absolute 0.1 0.0 - 0.1 K/uL   Immature Granulocytes 1 %   Abs Immature Granulocytes 0.04 0.00 - 0.07 K/uL    Comment: Performed at [facility], [address], [place], [place] [address]  Protime-INR     Status: Abnormal   Collection Time: 05/26/19  6:02 PM  Result Value Ref Range   Prothrombin Time 15.8 (H) 11.4 - 15.2 seconds   INR 1.3 (H) 0.8 - 1.2    Comment: (NOTE) INR goal varies based on device and disease states. Performed at [facility], [address] [place], [place] [address]   Culture, blood (Routine x 2)     Status: None (Preliminary result)   Collection Time: 05/26/19  6:02 PM   Specimen: Left Antecubital; Blood  Result Value Ref Range   Specimen Description LEFT ANTECUBITAL    Special Requests      BOTTLES DRAWN AEROBIC AND ANAEROBIC Blood Culture adequate volume Performed at [facility], [address], [place], [place] [address]    Culture PENDING    Report Status PENDING   Culture, blood (Routine x 2)     Status: None (Preliminary result)   Collection Time: 05/26/19  7:18 PM   Specimen: Right Antecubital; Blood  Result Value Ref Range   Specimen Description RIGHT ANTECUBITAL    Special Requests      BOTTLES DRAWN AEROBIC AND ANAEROBIC Blood Culture adequate volume Performed at [facility], [address] [place], [place] [address]     Culture PENDING    Report Status PENDING   Lactic acid, plasma     Status: Abnormal   Collection Time: 05/26/19  8:18 PM  Result Value Ref Range   Lactic Acid, Venous 2.0 (HH) 0.5 - 1.9 mmol/L    Comment: CRITICAL VALUE NOTED.  VALUE IS CONSISTENT WITH PREVIOUSLY REPORTED AND CALLED VALUE. Performed at [facility], [address], [place], [place] [address]   Urinalysis, Routine w reflex microscopic     Status: None   Collection Time: 05/26/19  8:40 PM  Result Value Ref Range   Color, Urine YELLOW YELLOW   APPearance CLEAR CLEAR   Specific Gravity, Urine 1.017 1.005 - 1.030   pH 5.0 5.0 - 8.0   Glucose, UA NEGATIVE NEGATIVE mg/dL   Hgb urine dipstick NEGATIVE NEGATIVE   Bilirubin Urine NEGATIVE NEGATIVE  Ketones, ur NEGATIVE NEGATIVE mg/dL   Protein, ur NEGATIVE NEGATIVE mg/dL   Nitrite NEGATIVE NEGATIVE   Leukocytes,Ua NEGATIVE NEGATIVE    Comment: Performed at [facility], [address], [place], [place] [address]  SARS Coronavirus 2 by RT PCR (hospital order, performed in [facility] hospital lab) Nasopharyngeal Nasopharyngeal Swab     Status: None   Collection Time: 05/26/19  8:41 PM   Specimen: Nasopharyngeal Swab  Result Value Ref Range   SARS Coronavirus 2 NEGATIVE NEGATIVE    Comment: (NOTE) If result is NEGATIVE SARS-CoV-2 target nucleic acids are NOT DETECTED. The SARS-CoV-2 RNA is generally detectable in upper and lower  respiratory specimens during the acute phase of infection. The lowest  concentration of SARS-CoV-2 viral copies this assay can detect is 250  copies / mL. A negative result does not preclude SARS-CoV-2 infection  and should not be used as the sole basis for treatment or other  patient management decisions.  A negative result may occur with  improper specimen collection / handling, submission of specimen other  than nasopharyngeal swab, presence of viral mutation(s) within the  areas targeted by this assay, and inadequate number of viral copies   (<250 copies / mL). A negative result must be combined with clinical  observations, patient history, and epidemiological information. If result is POSITIVE SARS-CoV-2 target nucleic acids are DETECTED. The SARS-CoV-2 RNA is generally detectable in upper and lower  respiratory specimens dur ing the acute phase of infection.  Positive  results are indicative of active infection with SARS-CoV-2.  Clinical  correlation with patient history and other diagnostic information is  necessary to determine patient infection status.  Positive results do  not rule out bacterial infection or co-infection with other viruses. If result is PRESUMPTIVE POSTIVE SARS-CoV-2 nucleic acids MAY BE PRESENT.   A presumptive positive result was obtained on the submitted specimen  and confirmed on repeat testing.  While 2019 novel coronavirus  (SARS-CoV-2) nucleic acids may be present in the submitted sample  additional confirmatory testing may be necessary for epidemiological  and / or clinical management purposes  to differentiate between  SARS-CoV-2 and other Sarbecovirus currently known to infect humans.  If clinically indicated additional testing with an alternate test  methodology [phone]) is advised. The SARS-CoV-2 RNA is generally  detectable in upper and lower respiratory sp ecimens during the acute  phase of infection. The expected result is Negative. Fact Sheet for Patients:  StrictlyIdeas.no Fact Sheet for Healthcare Providers: BankingDealers.co.za This test is not yet approved or cleared by the [place] FDA and has been authorized for detection and/or diagnosis of SARS-CoV-2 by FDA under an Emergency Use Authorization (EUA).  This EUA will remain in effect (meaning this test can be used) for the duration of the COVID-19 declaration under Section 564(b)(1) of the Act, 21 U.S.C. section 360bbb-3(b)(1), unless the authorization is terminated  or revoked sooner. Performed at [facility], [address], [place], [place] [address]     Chemistries  Recent Labs  Lab 05/26/19 1802  NA 131*  K 3.6  CL 95*  CO2 27  GLUCOSE 103*  BUN 16  CREATININE 1.33*  CALCIUM 7.7*  AST 45*  ALT 33  ALKPHOS 66  BILITOT 1.6*   ------------------------------------------------------------------------------------------------------------------  ------------------------------------------------------------------------------------------------------------------ GFR: Estimated Creatinine Clearance: 48 mL/min (A) (by C-G formula based on SCr of 1.33 mg/dL (H)). Liver Function Tests: Recent Labs  Lab 05/26/19 1802  AST 45*  ALT 33  ALKPHOS 66  BILITOT 1.6*  PROT 6.6  ALBUMIN 2.4*   No results for input(s): LIPASE, AMYLASE in the last 168 hours. No results for input(s): AMMONIA in the last 168 hours. Coagulation Profile: Recent Labs  Lab 05/26/19 1802  INR 1.3*    --------------------------------------------------------------------------------------------------------------- Urine analysis:    Component Value Date/Time   COLORURINE YELLOW 05/26/2019 2040   APPEARANCEUR CLEAR 05/26/2019 2040   LABSPEC 1.017 05/26/2019 2040   PHURINE 5.0 05/26/2019 2040   GLUCOSEU NEGATIVE 05/26/2019 2040   HGBUR NEGATIVE 05/26/2019 2040   BILIRUBINUR NEGATIVE 05/26/2019 2040   BILIRUBINUR Negative 02/08/2018 [place] 05/26/2019 2040   PROTEINUR NEGATIVE 05/26/2019 2040   UROBILINOGEN 1.0 02/08/2018 0955   NITRITE NEGATIVE 05/26/2019 2040   LEUKOCYTESUR NEGATIVE 05/26/2019 2040      Imaging Results:    Dg Chest Portable 1 View  Result Date: 05/26/2019 CLINICAL DATA:  Fever, weakness EXAM: PORTABLE CHEST 1 VIEW COMPARISON:  10/05/2017 FINDINGS: The heart size and mediastinal contours are within normal limits. Mild bilateral perihilar and bibasilar interstitial opacities. No large pleural fluid collection. No  pneumothorax. IMPRESSION: Mild bilateral perihilar and bibasilar interstitial opacities, right greater than left, which could represent an atypical and/or viral pneumonia. Electronically Signed   By: Davina Poke M.D.   On: 05/26/2019 19:20   Dg Knee Complete 4 Views Left  Result Date: 05/26/2019 CLINICAL DATA:  Knee stiffness EXAM: LEFT KNEE - COMPLETE 4+ VIEW COMPARISON:  None. FINDINGS: No acute fracture or malalignment. Mild tricompartmental osteoarthritis. Small enthesophyte at the quadriceps tendon insertion. No knee joint effusion. No acute soft tissue findings. IMPRESSION: No acute osseous abnormality, left knee. Electronically Signed   By: Davina Poke M.D.   On: 05/26/2019 19:21    My personal review of EKG: Rhythm NSR, no ST changes   Assessment & Plan:    Active Problems:   CAP (community acquired pneumonia)   1. Community-acquired pneumonia-seen on chest x-ray, initial lactic acid was 3.1, repeat lactic acid 2.0.  Continue ceftriaxone and Zithromax.  COVID-19 is negative.  Follow blood culture results.  Will obtain urinary strep pneumo antigen.  2. History of autoimmune hepatitis-continue Imuran, prednisone.  Patient is also on Valtrex 1000 mg daily.  We will continue with Valtrex.  We will hold Lasix.  3. Hypertension-continue Norvasc    DVT Prophylaxis-   Lovenox   AM Labs Ordered, also please review Full Orders  Family Communication: Admission, patients condition and plan of care including tests being ordered have been discussed with the patient  who indicate understanding and agree with the plan and Code Status.  Code Status: Full code  Admission status: Inpatient: Based on patients clinical presentation and evaluation of above clinical data, I have made determination that patient meets Inpatient criteria at this time.  Time spent in minutes : 60 min   Oswald Hillock M.D on 05/27/2019 at 1:33 AM

## 2019-05-27 NOTE — Progress Notes (Addendum)
ASSUMPTION OF CARE NOTE   05/27/2019 9:56 AM  Ethan Anderson was seen and examined.  The H&P by the admitting provider, orders, imaging was reviewed.  Please see new orders.  He is ambulating in the room with no difficulty.  I will ask PT to see him.  Hopefully he will be stable to go home in next 24-48 hours.   Time spent 35 minutes.   Will continue to follow.   Vitals:   05/27/19 0256 05/27/19 0458  BP: 112/66 111/69  Pulse: 74 72  Resp: 18 18  Temp: 97.8 F (36.6 C) 98 F (36.7 C)  SpO2: 100% 100%    Results for orders placed or performed during the hospital encounter of 05/26/19  Culture, blood (Routine x 2)   Specimen: Left Antecubital; Blood  Result Value Ref Range   Specimen Description LEFT ANTECUBITAL    Special Requests      BOTTLES DRAWN AEROBIC AND ANAEROBIC Blood Culture adequate volume   Culture      NO GROWTH < 24 HOURS Performed at [facility], [address] [place], [place] [address]    Report Status PENDING   Culture, blood (Routine x 2)   Specimen: Right Antecubital; Blood  Result Value Ref Range   Specimen Description RIGHT ANTECUBITAL    Special Requests      BOTTLES DRAWN AEROBIC AND ANAEROBIC Blood Culture adequate volume   Culture      NO GROWTH < 12 HOURS Performed at [facility], [address], [place], [place] [address]    Report Status PENDING   SARS Coronavirus 2 by RT PCR (hospital order, performed in [facility] hospital lab) Nasopharyngeal Nasopharyngeal Swab   Specimen: Nasopharyngeal Swab  Result Value Ref Range   SARS Coronavirus 2 NEGATIVE NEGATIVE  Comprehensive metabolic panel  Result Value Ref Range   Sodium 131 (L) 135 - 145 mmol/L   Potassium 3.6 3.5 - 5.1 mmol/L   Chloride 95 (L) 98 - 111 mmol/L   CO2 27 22 - 32 mmol/L   Glucose, Bld 103 (H) 70 - 99 mg/dL   BUN 16 8 - 23 mg/dL   Creatinine, Ser 1.33 (H) 0.61 - 1.24 mg/dL   Calcium 7.7 (L) 8.9 - 10.3 mg/dL   Total Protein 6.6 6.5 - 8.1 g/dL   Albumin 2.4 (L) 3.5 - 5.0  g/dL   AST 45 (H) 15 - 41 U/L   ALT 33 0 - 44 U/L   Alkaline Phosphatase 66 38 - 126 U/L   Total Bilirubin 1.6 (H) 0.3 - 1.2 mg/dL   GFR calc non Af Amer 49 (L) >60 mL/min   GFR calc Af Amer 57 (L) >60 mL/min   Anion gap 9 5 - 15  Lactic acid, plasma  Result Value Ref Range   Lactic Acid, Venous 3.1 (HH) 0.5 - 1.9 mmol/L  Lactic acid, plasma  Result Value Ref Range   Lactic Acid, Venous 2.0 (HH) 0.5 - 1.9 mmol/L  CBC with Differential  Result Value Ref Range   WBC 8.1 4.0 - 10.5 K/uL   RBC 4.28 4.22 - 5.81 MIL/uL   Hemoglobin 13.4 13.0 - 17.0 g/dL   HCT 39.5 39.0 - 52.0 %   MCV 92.3 80.0 - 100.0 fL   MCH 31.3 26.0 - 34.0 pg   MCHC 33.9 30.0 - 36.0 g/dL   RDW 15.7 (H) 11.5 - 15.5 %   Platelets 156 150 - 400 K/uL   nRBC 0.0 0.0 - 0.2 %  Neutrophils Relative % 76 %   Neutro Abs 6.2 1.7 - 7.7 K/uL   Lymphocytes Relative 16 %   Lymphs Abs 1.3 0.7 - 4.0 K/uL   Monocytes Relative 6 %   Monocytes Absolute 0.5 0.1 - 1.0 K/uL   Eosinophils Relative 0 %   Eosinophils Absolute 0.0 0.0 - 0.5 K/uL   Basophils Relative 1 %   Basophils Absolute 0.1 0.0 - 0.1 K/uL   Immature Granulocytes 1 %   Abs Immature Granulocytes 0.04 0.00 - 0.07 K/uL  Protime-INR  Result Value Ref Range   Prothrombin Time 15.8 (H) 11.4 - 15.2 seconds   INR 1.3 (H) 0.8 - 1.2  Urinalysis, Routine w reflex microscopic  Result Value Ref Range   Color, Urine YELLOW YELLOW   APPearance CLEAR CLEAR   Specific Gravity, Urine 1.017 1.005 - 1.030   pH 5.0 5.0 - 8.0   Glucose, UA NEGATIVE NEGATIVE mg/dL   Hgb urine dipstick NEGATIVE NEGATIVE   Bilirubin Urine NEGATIVE NEGATIVE   Ketones, ur NEGATIVE NEGATIVE mg/dL   Protein, ur NEGATIVE NEGATIVE mg/dL   Nitrite NEGATIVE NEGATIVE   Leukocytes,Ua NEGATIVE NEGATIVE  CBC  Result Value Ref Range   WBC 4.5 4.0 - 10.5 K/uL   RBC 3.70 (L) 4.22 - 5.81 MIL/uL   Hemoglobin 11.5 (L) 13.0 - 17.0 g/dL   HCT 34.6 (L) 39.0 - 52.0 %   MCV 93.5 80.0 - 100.0 fL   MCH 31.1 26.0  - 34.0 pg   MCHC 33.2 30.0 - 36.0 g/dL   RDW 15.8 (H) 11.5 - 15.5 %   Platelets 108 (L) 150 - 400 K/uL   nRBC 0.0 0.0 - 0.2 %  Comprehensive metabolic panel  Result Value Ref Range   Sodium 133 (L) 135 - 145 mmol/L   Potassium 3.5 3.5 - 5.1 mmol/L   Chloride 100 98 - 111 mmol/L   CO2 25 22 - 32 mmol/L   Glucose, Bld 84 70 - 99 mg/dL   BUN 14 8 - 23 mg/dL   Creatinine, Ser 1.08 0.61 - 1.24 mg/dL   Calcium 7.3 (L) 8.9 - 10.3 mg/dL   Total Protein 5.5 (L) 6.5 - 8.1 g/dL   Albumin 2.0 (L) 3.5 - 5.0 g/dL   AST 36 15 - 41 U/L   ALT 28 0 - 44 U/L   Alkaline Phosphatase 58 38 - 126 U/L   Total Bilirubin 1.3 (H) 0.3 - 1.2 mg/dL   GFR calc non Af Amer >60 >60 mL/min   GFR calc Af Amer >60 >60 mL/min   Anion gap 8 5 - 15     C. Wynetta Emery, MD [facility]   05/26/2019  5:40 PM How to contact the [facility] Attending or Consulting provider [place] or covering provider during after hours [place], for this patient?  1. Check the care team in [facility] and look for a) attending/consulting TRH provider listed and b) the [facility] team listed 2. Log into [email or web address] and use [facility]'s universal password to access. If you do not have the password, please contact the hospital operator. 3. Locate the [facility] provider you are looking for under [facility] and page to a number that you can be directly reached. 4. If you still have difficulty reaching the provider, please page the [facility] (Director on Call) for the Hospitalists listed on amion for assistance.

## 2019-05-27 NOTE — Evaluation (Signed)
Physical Therapy Evaluation Patient Details Name: Ethan Anderson MRN: [id number] DOB: [birth year] Today's Date: 05/27/2019   History of Present Illness  Ethan Anderson  is a 84 y.o. male, with history of gallstones, autoimmune hepatitis, gout, hypertension who was brought to the ED by EMS for generalized weakness, chills and fever at home.  As per patient wife patient started shivering while talking on the phone and became very weak.  Temperature 100.5 at home.  No history of nausea vomiting or diarrhea.  No chest pain or shortness of breath.  Denies coughing up any phlegm.In the ED chest x-ray showed bilateral pneumonia.Patient started on ceftriaxone and Zithromax.    Clinical Impression  Patient functioning at baseline for functional mobility and gait.  Plan:  Patient discharged from physical therapy to care of nursing for ambulation daily as tolerated for length of stay.     Follow Up Recommendations No PT follow up;Supervision - Intermittent    Equipment Recommendations  None recommended by PT    Recommendations for Other Services       Precautions / Restrictions Precautions Precautions: None Restrictions Weight Bearing Restrictions: No      Mobility  Bed Mobility Overal bed mobility: Independent                Transfers Overall transfer level: Modified independent               General transfer comment: slightly increased time  Ambulation/Gait Ambulation/Gait assistance: Modified independent (Device/Increase time) Gait Distance (Feet): 200 Feet Assistive device: None Gait Pattern/deviations: WFL(Within Functional Limits) Gait velocity: slightly decreased   General Gait Details: demonstrates good return for ambulation on level, inclined and declined surfaces without loss of balance  Stairs Stairs: Yes Stairs assistance: Modified independent (Device/Increase time) Stair Management: One rail Right;Alternating pattern;Step to pattern Number of Stairs:  9 General stair comments: demonstrates good return for going up/down steps with alternating pattern going up and occasional step to pattern going down using 1 siderail without loss of balance  Wheelchair Mobility    Modified Rankin (Stroke Patients Only)       Balance Overall balance assessment: No apparent balance deficits (not formally assessed)                                           Pertinent Vitals/Pain Pain Assessment: No/denies pain    Home Living Family/patient expects to be discharged to:: Private residence Living Arrangements: Alone Available Help at Discharge: Family;Available 24 hours/day Type of Home: House Home Access: Stairs to enter Entrance Stairs-Rails: None Entrance Stairs-Number of Steps: 2 Home Layout: One level Home Equipment: None      Prior Function Level of Independence: Independent         Comments: community ambulator, drives     Journalist, newspaper        Extremity/Trunk Assessment   Upper Extremity Assessment Upper Extremity Assessment: Overall WFL for tasks assessed    Lower Extremity Assessment Lower Extremity Assessment: Overall WFL for tasks assessed    Cervical / Trunk Assessment Cervical / Trunk Assessment: Normal  Communication   Communication: No difficulties  Cognition Arousal/Alertness: Awake/alert Behavior During Therapy: WFL for tasks assessed/performed Overall Cognitive Status: Within Functional Limits for tasks assessed  General Comments      Exercises     Assessment/Plan    PT Assessment Patent does not need any further PT services  PT Problem List         PT Treatment Interventions      PT Goals (Current goals can be found in the Care Plan section)  Acute Rehab PT Goals Patient Stated Goal: return home PT Goal Formulation: With patient Time For Goal Achievement: 05/27/19 Potential to Achieve Goals: Good    Frequency      Barriers to discharge        Co-evaluation               AM-PAC PT "6 Clicks" Mobility  Outcome Measure Help needed turning from your back to your side while in a flat bed without using bedrails?: None Help needed moving from lying on your back to sitting on the side of a flat bed without using bedrails?: None Help needed moving to and from a bed to a chair (including a wheelchair)?: None Help needed standing up from a chair using your arms (e.g., wheelchair or bedside chair)?: None Help needed to walk in hospital room?: None Help needed climbing 3-5 steps with a railing? : None 6 Click Score: 24    End of Session   Activity Tolerance: Patient tolerated treatment well Patient left: in chair;with call bell/phone within reach Nurse Communication: Mobility status PT Visit Diagnosis: Unsteadiness on feet (R26.81);Other abnormalities of gait and mobility (R26.89);Muscle weakness (generalized) (M62.81)    Time: [id number] PT Time Calculation (min) (ACUTE ONLY): 20 min   Charges:   PT Evaluation $PT Eval Moderate Complexity: 1 Mod PT Treatments $Gait Training: 8-22 mins        12:33 PM, 05/27/19 Ethan Anderson, MPT Physical Therapist with [facility] [phone] office [phone] mobile phone

## 2019-05-27 NOTE — Care Management Important Message (Signed)
Important Message  Patient Details  Name: Ethan Anderson MRN: [id number] Date of Birth: 08/15/1934   Medicare Important Message Given:  Yes     Tommy Medal 05/27/2019, 3:23 PM

## 2019-05-28 ENCOUNTER — Inpatient Hospital Stay (HOSPITAL_COMMUNITY): Payer: Medicare Other

## 2019-05-28 DIAGNOSIS — A419 Sepsis, unspecified organism: Principal | ICD-10-CM

## 2019-05-28 DIAGNOSIS — H3530 Unspecified macular degeneration: Secondary | ICD-10-CM | POA: Diagnosis present

## 2019-05-28 DIAGNOSIS — J189 Pneumonia, unspecified organism: Secondary | ICD-10-CM | POA: Diagnosis present

## 2019-05-28 DIAGNOSIS — E860 Dehydration: Secondary | ICD-10-CM | POA: Diagnosis present

## 2019-05-28 DIAGNOSIS — K754 Autoimmune hepatitis: Secondary | ICD-10-CM

## 2019-05-28 DIAGNOSIS — R7989 Other specified abnormal findings of blood chemistry: Secondary | ICD-10-CM | POA: Diagnosis present

## 2019-05-28 DIAGNOSIS — I10 Essential (primary) hypertension: Secondary | ICD-10-CM

## 2019-05-28 LAB — COMPREHENSIVE METABOLIC PANEL
ALT: 31 U/L (ref 0–44)
AST: 45 U/L — ABNORMAL HIGH (ref 15–41)
Albumin: 2 g/dL — ABNORMAL LOW (ref 3.5–5.0)
Alkaline Phosphatase: 59 U/L (ref 38–126)
Anion gap: 6 (ref 5–15)
BUN: 13 mg/dL (ref 8–23)
CO2: 27 mmol/L (ref 22–32)
Calcium: 7.6 mg/dL — ABNORMAL LOW (ref 8.9–10.3)
Chloride: 102 mmol/L (ref 98–111)
Creatinine, Ser: 1.07 mg/dL (ref 0.61–1.24)
GFR calc Af Amer: >60 mL/min (ref >60)
GFR calc non Af Amer: >60 mL/min (ref >60)
Glucose, Bld: 75 mg/dL (ref 70–99)
Potassium: 3.4 mmol/L — ABNORMAL LOW (ref 3.5–5.1)
Sodium: 135 mmol/L (ref 135–145)
Total Bilirubin: 1.2 mg/dL (ref 0.3–1.2)
Total Protein: 5.8 g/dL — ABNORMAL LOW (ref 6.5–8.1)

## 2019-05-28 LAB — CBC WITH DIFFERENTIAL/PLATELET
Abs Immature Granulocytes: 0.01 10*3/uL (ref 0.00–0.07)
Basophils Absolute: 0.1 10*3/uL (ref 0.0–0.1)
Basophils Relative: 1 %
Eosinophils Absolute: 0.2 10*3/uL (ref 0.0–0.5)
Eosinophils Relative: 4 %
HCT: 36.8 % — ABNORMAL LOW (ref 39.0–52.0)
Hemoglobin: 12.3 g/dL — ABNORMAL LOW (ref 13.0–17.0)
Immature Granulocytes: 0 %
Lymphocytes Relative: 40 %
Lymphs Abs: 2.2 10*3/uL (ref 0.7–4.0)
MCH: 31.4 pg (ref 26.0–34.0)
MCHC: 33.4 g/dL (ref 30.0–36.0)
MCV: 93.9 fL (ref 80.0–100.0)
Monocytes Absolute: 0.4 10*3/uL (ref 0.1–1.0)
Monocytes Relative: 7 %
Neutro Abs: 2.6 10*3/uL (ref 1.7–7.7)
Neutrophils Relative %: 48 %
Platelets: 129 10*3/uL — ABNORMAL LOW (ref 150–400)
RBC: 3.92 MIL/uL — ABNORMAL LOW (ref 4.22–5.81)
RDW: 16.1 % — ABNORMAL HIGH (ref 11.5–15.5)
WBC: 5.5 10*3/uL (ref 4.0–10.5)
nRBC: 0 % (ref 0.0–0.2)

## 2019-05-28 LAB — LACTIC ACID, PLASMA
Lactic Acid, Venous: 2 mmol/L (ref 0.5–1.9)
Lactic Acid, Venous: 1.6 mmol/L (ref 0.5–1.9)

## 2019-05-28 MED ORDER — ACETAMINOPHEN 325 MG PO TABS
650.0000 mg | ORAL_TABLET | Freq: Four times a day (QID) | ORAL | Status: DC
  Administered 2019-05-28 – 2019-05-30 (×4): 650 mg via ORAL
  Filled 2019-05-28 (×6): qty 2

## 2019-05-28 MED ORDER — POTASSIUM CHLORIDE CRYS ER 20 MEQ PO TBCR
40.0000 meq | EXTENDED_RELEASE_TABLET | Freq: Once | ORAL | Status: AC
  Administered 2019-05-28: 40 meq via ORAL
  Filled 2019-05-28: qty 2

## 2019-05-28 MED ORDER — INFLUENZA VAC A&B SA ADJ QUAD 0.5 ML IM PRSY
0.5000 mL | PREFILLED_SYRINGE | INTRAMUSCULAR | Status: AC
  Administered 2019-05-30: 0.5 mL via INTRAMUSCULAR
  Filled 2019-05-28 (×2): qty 0.5

## 2019-05-28 MED ORDER — ACETAMINOPHEN 650 MG RE SUPP: 650.0000 mg | Freq: Four times a day (QID) | RECTAL | Status: DC

## 2019-05-28 MED ORDER — SODIUM CHLORIDE 0.9 % IV BOLUS
1000.0000 mL | Freq: Once | INTRAVENOUS | Status: AC
  Administered 2019-05-28: 1000 mL via INTRAVENOUS

## 2019-05-28 NOTE — Progress Notes (Signed)
PROGRESS NOTE  LESLIE DESCHENES  [id number]  DOB: February 23, 1935  DOA: 05/26/2019 PCP: Eulas Post, MD  Brief Admission Hx: 84 y.o. male, with history of gallstones, autoimmune hepatitis, gout, hypertension who was brought to the ED by EMS for generalized weakness, chills and fever at home.  He was admitted with sepsis secondary to pneumonia.  MDM/Assessment & Plan:   1. Sepsis secondary to community-acquired pneumonia-his lactate is trending down however remains slightly elevated will give an additional bolus of normal saline today.  Continue IV fluid hydration.  Continue to follow blood cultures: No growth to date.  Continue IV antibiotics. 2. Community-acquired pneumonia-continue IV antibiotics and continue to follow clinically.  COVID-19 test was negative.  Continue to follow blood and urine cultures strep pneumo pending. 3. History of autoimmune hepatitis-he is currently being treated with Imuran and prednisone which is being weaned by his outpatient providers.  He is also on prophylactic Valtrex 1000 mg daily which is continued.  Lasix temporarily on hold due to his sepsis and dehydration.  Continue prednisone 10 mg daily. 4. Essential hypertension-blood pressures have been well controlled on Norvasc which she has been continued. 5. Rigors -suspect related to sepsis and likely has some bacteremia however blood cultures still no growth to date.  This seems to have improved.  We will continue to monitor closely.  I did order repeat blood cultures and lactic acid when he was actively having rigors.  DVT prophylaxis: Lovenox Code Status: Full Family Communication: Wife updated at bedside Disposition Plan: Continue inpatient care for IV antibiotics IV fluids  Consultants:    Procedures:    Antimicrobials:  Ceftriaxone, azithromycin 05/27/2019 >>  Subjective: Patient reports that he is starting to feel little better today.  He did have a fever overnight.  He is not having  rigors this morning  Objective: Vitals:   05/27/19 1550 05/27/19 2111 05/27/19 2227 05/28/19 0554  BP: (!) 150/76 (!) 120/56  112/70  Pulse: 93 (!) 112  71  Resp: 19 19  18   Temp: 98.7 F (37.1 C) (!) 102.8 F (39.3 C) 99.3 F (37.4 C) 97.6 F (36.4 C)  TempSrc: Oral Oral Oral Oral  SpO2: 92% 95%  99%  Weight:      Height:        Intake/Output Summary (Last 24 hours) at 05/28/2019 [id number] Last data filed at 05/28/2019 [id number] Gross per 24 hour  Intake 100 ml  Output 900 ml  Net -800 ml   Filed Weights   05/26/19 1727 05/27/19 0251  Weight: 95.3 kg 94.4 kg     REVIEW OF SYSTEMS  As per history otherwise all reviewed and reported negative  Exam:  General exam: Elderly male he is lying in the bed he is awake and alert in no distress.  He is cooperative. Respiratory system: Rales heard at the right lower lobe.  No increased work of breathing. Cardiovascular system: S1 & S2 heard. No JVD, murmurs, gallops, clicks or pedal edema. Gastrointestinal system: Abdomen is nondistended, soft and nontender. Normal bowel sounds heard. Central nervous system: Alert and oriented. No focal neurological deficits. Extremities: no CCE.  Data Reviewed: Basic Metabolic Panel: Recent Labs  Lab 05/26/19 1802 05/27/19 0446 05/28/19 0816  NA 131* 133* 135  K 3.6 3.5 3.4*  CL 95* 100 102  CO2 27 25 27   GLUCOSE 103* 84 75  BUN 16 14 13   CREATININE 1.33* 1.08 1.07  CALCIUM 7.7* 7.3* 7.6*   Liver Function Tests: Recent Labs  Lab 05/26/19 1802 05/27/19 0446 05/28/19 0816  AST 45* 36 45*  ALT 33 28 31  ALKPHOS 66 58 59  BILITOT 1.6* 1.3* 1.2  PROT 6.6 5.5* 5.8*  ALBUMIN 2.4* 2.0* 2.0*   No results for input(s): LIPASE, AMYLASE in the last 168 hours. No results for input(s): AMMONIA in the last 168 hours. CBC: Recent Labs  Lab 05/26/19 1802 05/27/19 0446 05/28/19 0816  WBC 8.1 4.5 5.5  NEUTROABS 6.2  --  2.6  HGB 13.4 11.5* 12.3*  HCT 39.5 34.6* 36.8*  MCV 92.3 93.5 93.9   PLT 156 108* 129*   Cardiac Enzymes: No results for input(s): CKTOTAL, CKMB, CKMBINDEX, TROPONINI in the last 168 hours. CBG (last 3)  No results for input(s): GLUCAP in the last 72 hours. Recent Results (from the past 240 hour(s))  Culture, blood (Routine x 2)     Status: None (Preliminary result)   Collection Time: 05/26/19  6:02 PM   Specimen: Left Antecubital; Blood  Result Value Ref Range Status   Specimen Description LEFT ANTECUBITAL  Final   Special Requests   Final    BOTTLES DRAWN AEROBIC AND ANAEROBIC Blood Culture adequate volume   Culture   Final    NO GROWTH 2 DAYS Performed at [facility], [address], [place], [place] [address]    Report Status PENDING  Incomplete  Culture, blood (Routine x 2)     Status: None (Preliminary result)   Collection Time: 05/26/19  7:18 PM   Specimen: Right Antecubital; Blood  Result Value Ref Range Status   Specimen Description RIGHT ANTECUBITAL  Final   Special Requests   Final    BOTTLES DRAWN AEROBIC AND ANAEROBIC Blood Culture adequate volume   Culture   Final    NO GROWTH 2 DAYS Performed at [facility], [address] [place], [place] [address]    Report Status PENDING  Incomplete  SARS Coronavirus 2 by RT PCR (hospital order, performed in [place] hospital lab) Nasopharyngeal Nasopharyngeal Swab     Status: None   Collection Time: 05/26/19  8:41 PM   Specimen: Nasopharyngeal Swab  Result Value Ref Range Status   SARS Coronavirus 2 NEGATIVE NEGATIVE Final    Comment: (NOTE) If result is NEGATIVE SARS-CoV-2 target nucleic acids are NOT DETECTED. The SARS-CoV-2 RNA is generally detectable in upper and lower  respiratory specimens during the acute phase of infection. The lowest  concentration of SARS-CoV-2 viral copies this assay can detect is 250  copies / mL. A negative result does not preclude SARS-CoV-2 infection  and should not be used as the sole basis for treatment or other  patient management decisions.  A  negative result may occur with  improper specimen collection / handling, submission of specimen other  than nasopharyngeal swab, presence of viral mutation(s) within the  areas targeted by this assay, and inadequate number of viral copies  (<250 copies / mL). A negative result must be combined with clinical  observations, patient history, and epidemiological information. If result is POSITIVE SARS-CoV-2 target nucleic acids are DETECTED. The SARS-CoV-2 RNA is generally detectable in upper and lower  respiratory specimens dur ing the acute phase of infection.  Positive  results are indicative of active infection with SARS-CoV-2.  Clinical  correlation with patient history and other diagnostic information is  necessary to determine patient infection status.  Positive results do  not rule out bacterial infection or co-infection with other viruses. If result is PRESUMPTIVE POSTIVE SARS-CoV-2 nucleic  acids MAY BE PRESENT.   A presumptive positive result was obtained on the submitted specimen  and confirmed on repeat testing.  While 2019 novel coronavirus  (SARS-CoV-2) nucleic acids may be present in the submitted sample  additional confirmatory testing may be necessary for epidemiological  and / or clinical management purposes  to differentiate between  SARS-CoV-2 and other Sarbecovirus currently known to infect humans.  If clinically indicated additional testing with an alternate test  methodology [phone]) is advised. The SARS-CoV-2 RNA is generally  detectable in upper and lower respiratory sp ecimens during the acute  phase of infection. The expected result is Negative. Fact Sheet for Patients:  StrictlyIdeas.no Fact Sheet for Healthcare Providers: BankingDealers.co.za This test is not yet approved or cleared by the [place] FDA and has been authorized for detection and/or diagnosis of SARS-CoV-2 by FDA under an Emergency Use  Authorization (EUA).  This EUA will remain in effect (meaning this test can be used) for the duration of the COVID-19 declaration under Section 564(b)(1) of the Act, 21 U.S.C. section 360bbb-3(b)(1), unless the authorization is terminated or revoked sooner. Performed at [facility], [address], [place], [place] [address]   Culture, blood (Routine X 2) w Reflex to ID Panel     Status: None (Preliminary result)   Collection Time: 05/27/19  5:06 PM   Specimen: Left Antecubital  Result Value Ref Range Status   Specimen Description LEFT ANTECUBITAL  Final   Special Requests   Final    BOTTLES DRAWN AEROBIC AND ANAEROBIC Blood Culture adequate volume   Culture   Final    NO GROWTH < 24 HOURS Performed at [facility], [address] [place], [place] [address]    Report Status PENDING  Incomplete  Culture, blood (Routine X 2) w Reflex to ID Panel     Status: None (Preliminary result)   Collection Time: 05/27/19  5:29 PM   Specimen: BLOOD LEFT HAND  Result Value Ref Range Status   Specimen Description BLOOD LEFT HAND  Final   Special Requests   Final    BOTTLES DRAWN AEROBIC AND ANAEROBIC Blood Culture adequate volume   Culture   Final    NO GROWTH < 12 HOURS Performed at [facility], [address] [place], [place] [address]    Report Status PENDING  Incomplete     Studies: US Venous Img Lower Bilateral  Result Date: 05/27/2019 CLINICAL DATA:  Bilateral lower extremity pain and edema, left greater than right. Evaluate for DVT. EXAM: BILATERAL LOWER EXTREMITY VENOUS DOPPLER ULTRASOUND TECHNIQUE: Gray-scale sonography with graded compression, as well as color Doppler and duplex ultrasound were performed to evaluate the lower extremity deep venous systems from the level of the common femoral vein and including the common femoral, femoral, profunda femoral, popliteal and calf veins including the posterior tibial, peroneal and gastrocnemius veins when visible. The superficial great  saphenous vein was also interrogated. Spectral Doppler was utilized to evaluate flow at rest and with distal augmentation maneuvers in the common femoral, femoral and popliteal veins. COMPARISON:  None. FINDINGS: RIGHT LOWER EXTREMITY Common Femoral Vein: No evidence of thrombus. Normal compressibility, respiratory phasicity and response to augmentation. Saphenofemoral Junction: No evidence of thrombus. Normal compressibility and flow on color Doppler imaging. Profunda Femoral Vein: No evidence of thrombus. Normal compressibility and flow on color Doppler imaging. Femoral Vein: No evidence of thrombus. Normal compressibility, respiratory phasicity and response to augmentation. Popliteal Vein: No evidence of thrombus. Normal compressibility, respiratory phasicity and response  to augmentation. Calf Veins: No evidence of thrombus. Normal compressibility and flow on color Doppler imaging. Superficial Great Saphenous Vein: No evidence of thrombus. Normal compressibility. Venous Reflux:  None. Other Findings:  None. LEFT LOWER EXTREMITY Common Femoral Vein: No evidence of thrombus. Normal compressibility, respiratory phasicity and response to augmentation. Saphenofemoral Junction: No evidence of thrombus. Normal compressibility and flow on color Doppler imaging. Profunda Femoral Vein: No evidence of thrombus. Normal compressibility and flow on color Doppler imaging. Femoral Vein: No evidence of thrombus. Normal compressibility, respiratory phasicity and response to augmentation. Popliteal Vein: No evidence of thrombus. Normal compressibility, respiratory phasicity and response to augmentation. Calf Veins: No evidence of thrombus. Normal compressibility and flow on color Doppler imaging. Superficial Great Saphenous Vein: No evidence of thrombus. Normal compressibility. Venous Reflux:  None. Other Findings:  None. IMPRESSION: No evidence of DVT within either lower extremity. Electronically Signed   By: Sandi Mariscal M.D.    On: 05/27/2019 11:14   Dg Chest Port 1 View  Result Date: 05/28/2019 CLINICAL DATA:  Fever and weakness. EXAM: PORTABLE CHEST 1 VIEW COMPARISON:  05/26/2019 FINDINGS: Heart size is normal. Increased bilateral pulmonary markings persists which could represent pneumonia or widespread bronchitis. No dense consolidation, collapse or effusion. IMPRESSION: Persistent abnormal pulmonary markings. This could represent pneumonia or widespread bronchitis. No dense consolidation or collapse. Electronically Signed   By: Nelson Chimes M.D.   On: 05/28/2019 08:20   Dg Chest Portable 1 View  Result Date: 05/26/2019 CLINICAL DATA:  Fever, weakness EXAM: PORTABLE CHEST 1 VIEW COMPARISON:  10/05/2017 FINDINGS: The heart size and mediastinal contours are within normal limits. Mild bilateral perihilar and bibasilar interstitial opacities. No large pleural fluid collection. No pneumothorax. IMPRESSION: Mild bilateral perihilar and bibasilar interstitial opacities, right greater than left, which could represent an atypical and/or viral pneumonia. Electronically Signed   By: Davina Poke M.D.   On: 05/26/2019 19:20   Dg Knee Complete 4 Views Left  Result Date: 05/26/2019 CLINICAL DATA:  Knee stiffness EXAM: LEFT KNEE - COMPLETE 4+ VIEW COMPARISON:  None. FINDINGS: No acute fracture or malalignment. Mild tricompartmental osteoarthritis. Small enthesophyte at the quadriceps tendon insertion. No knee joint effusion. No acute soft tissue findings. IMPRESSION: No acute osseous abnormality, left knee. Electronically Signed   By: Davina Poke M.D.   On: 05/26/2019 19:21     Scheduled Meds:  amLODipine  5 mg Oral Daily   azaTHIOprine  50 mg Oral Daily   enoxaparin (LOVENOX) injection  40 mg Subcutaneous Q24H   [START ON 05/29/2019] influenza vaccine adjuvanted  0.5 mL Intramuscular Tomorrow-1000   ketorolac  1 drop Right Eye BID   potassium chloride  40 mEq Oral Once   predniSONE  10 mg Oral Q breakfast    valACYclovir  1,000 mg Oral Daily   Continuous Infusions:  sodium chloride 70 mL/hr at 05/27/19 2048   azithromycin 500 mg (05/27/19 2053)   cefTRIAXone (ROCEPHIN)  IV Stopped (05/27/19 1843)   sodium chloride      Active Problems:   Sepsis due to pneumonia ([place])   HTN (hypertension)   Autoimmune hepatitis treated with steroids ([place])   CAP (community acquired pneumonia)   Macular degeneration   Elevated lactic acid level   Dehydration  Time spent:   Irwin Brakeman, MD [facility] 05/28/2019, 9:52 AM    LOS: 1 day  How to contact the [facility] Attending or Consulting provider [place] or covering provider during after hours [place], for this patient?  1. Check the care team in [facility] and look for a) attending/consulting [facility] provider listed and b) the [facility] team listed 2. Log into [email or web address] and use [facility] universal password to access. If you do not have the password, please contact the hospital operator. 3. Locate the [facility] provider you are looking for under [facility] and page to a number that you can be directly reached. 4. If you still have difficulty reaching the provider, please page the [facility] (Director on Call) for the Hospitalists listed on amion for assistance.

## 2019-05-29 ENCOUNTER — Inpatient Hospital Stay (HOSPITAL_COMMUNITY): Payer: Medicare Other

## 2019-05-29 LAB — COMPREHENSIVE METABOLIC PANEL
ALT: 27 U/L (ref 0–44)
AST: 41 U/L (ref 15–41)
Albumin: 1.9 g/dL — ABNORMAL LOW (ref 3.5–5.0)
Alkaline Phosphatase: 55 U/L (ref 38–126)
Anion gap: 6 (ref 5–15)
BUN: 11 mg/dL (ref 8–23)
CO2: 22 mmol/L (ref 22–32)
Calcium: 7.4 mg/dL — ABNORMAL LOW (ref 8.9–10.3)
Chloride: 106 mmol/L (ref 98–111)
Creatinine, Ser: 0.91 mg/dL (ref 0.61–1.24)
GFR calc Af Amer: >60 mL/min (ref >60)
GFR calc non Af Amer: >60 mL/min (ref >60)
Glucose, Bld: 70 mg/dL (ref 70–99)
Potassium: 3.5 mmol/L (ref 3.5–5.1)
Sodium: 134 mmol/L — ABNORMAL LOW (ref 135–145)
Total Bilirubin: 1 mg/dL (ref 0.3–1.2)
Total Protein: 5.4 g/dL — ABNORMAL LOW (ref 6.5–8.1)

## 2019-05-29 LAB — CBC WITH DIFFERENTIAL/PLATELET
Abs Immature Granulocytes: 0.02 10*3/uL (ref 0.00–0.07)
Basophils Absolute: 0 10*3/uL (ref 0.0–0.1)
Basophils Relative: 1 %
Eosinophils Absolute: 0.2 10*3/uL (ref 0.0–0.5)
Eosinophils Relative: 4 %
HCT: 33.1 % — ABNORMAL LOW (ref 39.0–52.0)
Hemoglobin: 11.4 g/dL — ABNORMAL LOW (ref 13.0–17.0)
Immature Granulocytes: 0 %
Lymphocytes Relative: 37 %
Lymphs Abs: 1.9 10*3/uL (ref 0.7–4.0)
MCH: 31.8 pg (ref 26.0–34.0)
MCHC: 34.4 g/dL (ref 30.0–36.0)
MCV: 92.5 fL (ref 80.0–100.0)
Monocytes Absolute: 0.4 10*3/uL (ref 0.1–1.0)
Monocytes Relative: 8 %
Neutro Abs: 2.6 10*3/uL (ref 1.7–7.7)
Neutrophils Relative %: 50 %
Platelets: 144 10*3/uL — ABNORMAL LOW (ref 150–400)
RBC: 3.58 MIL/uL — ABNORMAL LOW (ref 4.22–5.81)
RDW: 16 % — ABNORMAL HIGH (ref 11.5–15.5)
WBC: 5.2 10*3/uL (ref 4.0–10.5)
nRBC: 0 % (ref 0.0–0.2)

## 2019-05-29 NOTE — Progress Notes (Signed)
PROGRESS NOTE  ELLISON SACHDEVA  [id number]  DOB: Nov 16, 1934  DOA: 05/26/2019 PCP: Eulas Post, MD  Brief Admission Hx: 84 y.o. male, with history of gallstones, autoimmune hepatitis, gout, hypertension who was brought to the ED by EMS for generalized weakness, chills and fever at home.  He was admitted with sepsis secondary to pneumonia.  MDM/Assessment & Plan:   1. Sepsis secondary to community-acquired pneumonia-his lactate is normalized now. He is clinically improving.  Continue IV fluid hydration.  Continue to follow blood cultures: No growth to date.  Continue IV antibiotics. 2. Community-acquired pneumonia-continue IV antibiotics and continue to follow clinically.  COVID-19 test was negative.  Continue to follow blood and urine cultures strep pneumo pending. 3. History of autoimmune hepatitis-he is currently being treated with Imuran and prednisone which is being weaned by his outpatient providers.  He is also on prophylactic Valtrex 1000 mg daily which is continued.  Lasix temporarily on hold due to his sepsis and dehydration.  Continue prednisone 10 mg daily.  Abd [place] ordered for liver surveillance.  4. Essential hypertension-blood pressures have been well controlled on Norvasc which she has been continued. 5. Rigors -suspect related to sepsis and likely has some bacteremia however blood cultures still no growth to date.  This seems to have improved.  We will continue to monitor closely.  I did order repeat blood cultures and lactic acid when he was actively having rigors. 6. Fever - temp trending down. Follow.   DVT prophylaxis: Lovenox Code Status: Full Family Communication: Wife updated Disposition Plan: Continue inpatient care for IV antibiotics IV fluids, possible DC home 10/19  Consultants:    Procedures:    Antimicrobials:  Ceftriaxone, azithromycin 05/27/2019 >>  Subjective: Patient reports that he has not been ambulating much yesterday, cough  nonproductive, denies SOB.   Objective: Vitals:   05/28/19 1628 05/28/19 2227 05/29/19 0407 05/29/19 0805  BP: 125/78 (!) 144/80 105/66   Pulse: 86 93 83   Resp: 18 16 18    Temp: 98.1 F (36.7 C) 99.1 F (37.3 C) 98.2 F (36.8 C)   TempSrc: Oral Oral Oral   SpO2: 100% 100% 100% 98%  Weight:      Height:        Intake/Output Summary (Last 24 hours) at 05/29/2019 1109 Last data filed at 05/29/2019 [id number] Gross per 24 hour  Intake 3330 ml  Output 750 ml  Net 2580 ml   Filed Weights   05/26/19 1727 05/27/19 0251  Weight: 95.3 kg 94.4 kg   REVIEW OF SYSTEMS  As per history otherwise all reviewed and reported negative  Exam:  General exam: Elderly male he is lying in the bed he is awake and alert in no distress.  He is cooperative. Respiratory system:improved rales right lower lobe.  No increased work of breathing. Cardiovascular system: S1 & S2 heard. No JVD, murmurs, gallops, clicks or pedal edema. Gastrointestinal system: Abdomen is nondistended, soft and nontender. Normal bowel sounds heard. Central nervous system: Alert and oriented. No focal neurological deficits. Extremities: no CCE.  Data Reviewed: Basic Metabolic Panel: Recent Labs  Lab 05/26/19 1802 05/27/19 0446 05/28/19 0816 05/29/19 0553  NA 131* 133* 135 134*  K 3.6 3.5 3.4* 3.5  CL 95* 100 102 106  CO2 27 25 27 22   GLUCOSE 103* 84 75 70  BUN 16 14 13 11   CREATININE 1.33* 1.08 1.07 0.91  CALCIUM 7.7* 7.3* 7.6* 7.4*   Liver Function Tests: Recent Labs  Lab 05/26/19 1802  05/27/19 0446 05/28/19 0816 05/29/19 0553  AST 45* 36 45* 41  ALT 33 28 31 27   ALKPHOS 66 58 59 55  BILITOT 1.6* 1.3* 1.2 1.0  PROT 6.6 5.5* 5.8* 5.4*  ALBUMIN 2.4* 2.0* 2.0* 1.9*   No results for input(s): LIPASE, AMYLASE in the last 168 hours. No results for input(s): AMMONIA in the last 168 hours. CBC: Recent Labs  Lab 05/26/19 1802 05/27/19 0446 05/28/19 0816 05/29/19 0553  WBC 8.1 4.5 5.5 5.2  NEUTROABS 6.2   --  2.6 2.6  HGB 13.4 11.5* 12.3* 11.4*  HCT 39.5 34.6* 36.8* 33.1*  MCV 92.3 93.5 93.9 92.5  PLT 156 108* 129* 144*   Cardiac Enzymes: No results for input(s): CKTOTAL, CKMB, CKMBINDEX, TROPONINI in the last 168 hours. CBG (last 3)  No results for input(s): GLUCAP in the last 72 hours. Recent Results (from the past 240 hour(s))  Culture, blood (Routine x 2)     Status: None (Preliminary result)   Collection Time: 05/26/19  6:02 PM   Specimen: Left Antecubital; Blood  Result Value Ref Range Status   Specimen Description LEFT ANTECUBITAL  Final   Special Requests   Final    BOTTLES DRAWN AEROBIC AND ANAEROBIC Blood Culture adequate volume   Culture   Final    NO GROWTH 2 DAYS Performed at [facility], [address] [place], [place] [address]    Report Status PENDING  Incomplete  Culture, blood (Routine x 2)     Status: None (Preliminary result)   Collection Time: 05/26/19  7:18 PM   Specimen: Right Antecubital; Blood  Result Value Ref Range Status   Specimen Description RIGHT ANTECUBITAL  Final   Special Requests   Final    BOTTLES DRAWN AEROBIC AND ANAEROBIC Blood Culture adequate volume   Culture   Final    NO GROWTH 2 DAYS Performed at [facility], [address] [place], [place] [address]    Report Status PENDING  Incomplete  SARS Coronavirus 2 by RT PCR (hospital order, performed in [place] hospital lab) Nasopharyngeal Nasopharyngeal Swab     Status: None   Collection Time: 05/26/19  8:41 PM   Specimen: Nasopharyngeal Swab  Result Value Ref Range Status   SARS Coronavirus 2 NEGATIVE NEGATIVE Final    Comment: (NOTE) If result is NEGATIVE SARS-CoV-2 target nucleic acids are NOT DETECTED. The SARS-CoV-2 RNA is generally detectable in upper and lower  respiratory specimens during the acute phase of infection. The lowest  concentration of SARS-CoV-2 viral copies this assay can detect is 250  copies / mL. A negative result does not preclude SARS-CoV-2 infection   and should not be used as the sole basis for treatment or other  patient management decisions.  A negative result may occur with  improper specimen collection / handling, submission of specimen other  than nasopharyngeal swab, presence of viral mutation(s) within the  areas targeted by this assay, and inadequate number of viral copies  (<250 copies / mL). A negative result must be combined with clinical  observations, patient history, and epidemiological information. If result is POSITIVE SARS-CoV-2 target nucleic acids are DETECTED. The SARS-CoV-2 RNA is generally detectable in upper and lower  respiratory specimens dur ing the acute phase of infection.  Positive  results are indicative of active infection with SARS-CoV-2.  Clinical  correlation with patient history and other diagnostic information is  necessary to determine patient infection status.  Positive results do  not rule out bacterial  infection or co-infection with other viruses. If result is PRESUMPTIVE POSTIVE SARS-CoV-2 nucleic acids MAY BE PRESENT.   A presumptive positive result was obtained on the submitted specimen  and confirmed on repeat testing.  While 2019 novel coronavirus  (SARS-CoV-2) nucleic acids may be present in the submitted sample  additional confirmatory testing may be necessary for epidemiological  and / or clinical management purposes  to differentiate between  SARS-CoV-2 and other Sarbecovirus currently known to infect humans.  If clinically indicated additional testing with an alternate test  methodology [phone]) is advised. The SARS-CoV-2 RNA is generally  detectable in upper and lower respiratory sp ecimens during the acute  phase of infection. The expected result is Negative. Fact Sheet for Patients:  StrictlyIdeas.no Fact Sheet for Healthcare Providers: BankingDealers.co.za This test is not yet approved or cleared by the [place] FDA  and has been authorized for detection and/or diagnosis of SARS-CoV-2 by FDA under an Emergency Use Authorization (EUA).  This EUA will remain in effect (meaning this test can be used) for the duration of the COVID-19 declaration under Section 564(b)(1) of the Act, 21 U.S.C. section 360bbb-3(b)(1), unless the authorization is terminated or revoked sooner. Performed at [facility], [address], [place], [place] [address]   Culture, blood (Routine X 2) w Reflex to ID Panel     Status: None (Preliminary result)   Collection Time: 05/27/19  5:06 PM   Specimen: Left Antecubital  Result Value Ref Range Status   Specimen Description LEFT ANTECUBITAL  Final   Special Requests   Final    BOTTLES DRAWN AEROBIC AND ANAEROBIC Blood Culture adequate volume   Culture   Final    NO GROWTH < 24 HOURS Performed at [facility], [address] [place], [place] [address]    Report Status PENDING  Incomplete  Culture, blood (Routine X 2) w Reflex to ID Panel     Status: None (Preliminary result)   Collection Time: 05/27/19  5:29 PM   Specimen: BLOOD LEFT HAND  Result Value Ref Range Status   Specimen Description BLOOD LEFT HAND  Final   Special Requests   Final    BOTTLES DRAWN AEROBIC AND ANAEROBIC Blood Culture adequate volume   Culture   Final    NO GROWTH < 12 HOURS Performed at [facility], [address] [place], [place] [address]    Report Status PENDING  Incomplete     Studies: Dg Chest Port 1 View  Result Date: 05/28/2019 CLINICAL DATA:  Fever and weakness. EXAM: PORTABLE CHEST 1 VIEW COMPARISON:  05/26/2019 FINDINGS: Heart size is normal. Increased bilateral pulmonary markings persists which could represent pneumonia or widespread bronchitis. No dense consolidation, collapse or effusion. IMPRESSION: Persistent abnormal pulmonary markings. This could represent pneumonia or widespread bronchitis. No dense consolidation or collapse. Electronically Signed   By: Nelson Chimes M.D.   On:  05/28/2019 08:20   Scheduled Meds:  acetaminophen  650 mg Oral Q6H   Or   acetaminophen  650 mg Rectal Q6H   amLODipine  5 mg Oral Daily   azaTHIOprine  50 mg Oral Daily   enoxaparin (LOVENOX) injection  40 mg Subcutaneous Q24H   influenza vaccine adjuvanted  0.5 mL Intramuscular Tomorrow-1000   ketorolac  1 drop Right Eye BID   predniSONE  10 mg Oral Q breakfast   valACYclovir  1,000 mg Oral Daily   Continuous Infusions:  sodium chloride 70 mL/hr at 05/29/19 0440   azithromycin 500 mg (05/28/19 2005)  cefTRIAXone (ROCEPHIN)  IV 1 g (05/28/19 1721)    Active Problems:   Sepsis due to pneumonia ([place])   HTN (hypertension)   Autoimmune hepatitis treated with steroids ([place])   CAP (community acquired pneumonia)   Macular degeneration   Elevated lactic acid level   Dehydration  Time spent:   Irwin Brakeman, MD [facility] 05/29/2019, 11:09 AM    LOS: 2 days  How to contact the [facility] Attending or Consulting provider [place] or covering provider during after hours [place], for this patient?  1. Check the care team in [facility] and look for a) attending/consulting [facility] provider listed and b) the [facility] team listed 2. Log into [email or web address] and use [place]'s universal password to access. If you do not have the password, please contact the hospital operator. 3. Locate the [facility] provider you are looking for under [facility] and page to a number that you can be directly reached. 4. If you still have difficulty reaching the provider, please page the [facility] (Director on Call) for the Hospitalists listed on amion for assistance.

## 2019-05-30 ENCOUNTER — Encounter (HOSPITAL_COMMUNITY): Payer: Self-pay

## 2019-05-30 ENCOUNTER — Ambulatory Visit (HOSPITAL_COMMUNITY): Admission: RE | Admit: 2019-05-30 | Payer: Medicare Other | Source: Ambulatory Visit

## 2019-05-30 LAB — COMPREHENSIVE METABOLIC PANEL
ALT: 29 U/L (ref 0–44)
AST: 46 U/L — ABNORMAL HIGH (ref 15–41)
Albumin: 1.9 g/dL — ABNORMAL LOW (ref 3.5–5.0)
Alkaline Phosphatase: 59 U/L (ref 38–126)
Anion gap: 5 (ref 5–15)
BUN: 8 mg/dL (ref 8–23)
CO2: 24 mmol/L (ref 22–32)
Calcium: 7.5 mg/dL — ABNORMAL LOW (ref 8.9–10.3)
Chloride: 108 mmol/L (ref 98–111)
Creatinine, Ser: 0.84 mg/dL (ref 0.61–1.24)
GFR calc Af Amer: >60 mL/min (ref >60)
GFR calc non Af Amer: >60 mL/min (ref >60)
Glucose, Bld: 61 mg/dL — ABNORMAL LOW (ref 70–99)
Potassium: 3.5 mmol/L (ref 3.5–5.1)
Sodium: 137 mmol/L (ref 135–145)
Total Bilirubin: 1 mg/dL (ref 0.3–1.2)
Total Protein: 5.6 g/dL — ABNORMAL LOW (ref 6.5–8.1)

## 2019-05-30 MED ORDER — DOXYCYCLINE HYCLATE 100 MG PO CAPS: 100.0000 mg | ORAL_CAPSULE | Freq: Two times a day (BID) | ORAL | 0 refills | Status: AC

## 2019-05-30 NOTE — Care Management Important Message (Signed)
Important Message  Patient Details  Name: Ethan Anderson MRN: [id number] Date of Birth: Dec 31, 1934   Medicare Important Message Given:  Yes     Tommy Medal 05/30/2019, 9:31 AM

## 2019-05-30 NOTE — Discharge Summary (Addendum)
Physician Discharge Summary  Ethan Anderson [id number] DOB: 1934-10-02 DOA: 05/26/2019  PCP: Eulas Post, MD GI: H. Danis MD  Admit date: 05/26/2019 Discharge date: 05/30/2019  Admitted From: Home Disposition: Home  Recommendations for Outpatient Follow-up:  1. Follow up with PCP in 1 weeks 2. Follow up with hepatologist in 1-2 weeks 3. Please follow-up final blood culture urine culture results. 4. Please check liver function tests and bilirubin on outpatient follow-up visit. 5. Please review abdominal ultrasound with patient on outpatient follow-up visit.  Discharge Condition: STABLE   CODE STATUS: FULL    Brief Hospitalization Summary: Please see all hospital notes, images, labs for full details of the hospitalization.  HPI:  Dr. Darrick Meigs:   Ethan Anderson  is a 84 y.o. male, with history of gallstones, autoimmune hepatitis, gout, hypertension who was brought to the ED by EMS for generalized weakness, chills and fever at home.  As per patient wife patient started shivering while talking on the phone and became very weak.  Temperature 100.5 at home.  No history of nausea vomiting or diarrhea.  No chest pain or shortness of breath.  Denies coughing up any phlegm. In the ED chest x-ray showed bilateral pneumonia. Patient started on ceftriaxone and Zithromax.  MDM/Assessment & Plan:   1. Sepsis secondary to community-acquired pneumonia-his lactate is normalized now. He is clinically improved.    He was treated with IV fluid hydration.  Continue to follow blood cultures: No growth to date.   2. Community-acquired pneumonia-treated with IV antibiotics and clinically is improved and feeling much better. COVID-19 test was negative.    His blood cultures have been no growth to date. 3. History of autoimmune hepatitis-he is currently being treated with Imuran and prednisone which is being weaned by his outpatient providers.  He is also on prophylactic Valtrex 1000 mg daily which is  continued.  Continue prednisone 10 mg daily.  Abd [place] ordered with findings of dilated biliary duct however patient has normal liver enzymes and normal bilirubin and do not suspect that he has an obstruction at this time as he has no abdominal pain symptoms and has been eating and drinking well with no difficulties.  I recommended that he follow-up with his hepatologist to review results and get further testing as needed.  Patient advised to return if he develops nausea vomiting abdominal pain fever chills and he verbalized understanding.  4. Essential hypertension-blood pressures have been well controlled on Norvasc which she has been continued. 5. Rigors -resolved.  Blood cultures still no growth to date.   I did order repeat blood cultures and lactic acid when he was actively having rigors but the repeat cultures have also been no growth to date. 6. Fever -resolved now.  DVT prophylaxis: Lovenox Code Status: Full Family Communication: Wife updated Disposition Plan:  Home with close outpatient follow-up  Consultants:    Procedures:    Antimicrobials:  Ceftriaxone, azithromycin 05/27/2019 >> Discharge Diagnoses:  Active Problems:   Sepsis due to pneumonia ([place])   HTN (hypertension)   Autoimmune hepatitis treated with steroids ([place])   CAP (community acquired pneumonia)   Macular degeneration   Elevated lactic acid level   Dehydration   Discharge Instructions:  Allergies as of 05/30/2019   No Known Allergies     Medication List    TAKE these medications   amLODipine 5 MG tablet Commonly known as: NORVASC TAKE ONE (1) TABLET EACH DAY What changed:   how much to take  how to  take this  when to take this   azaTHIOprine 50 MG tablet Commonly known as: IMURAN Take 50 mg by mouth daily.   doxycycline 100 MG capsule Commonly known as: VIBRAMYCIN Take 1 capsule (100 mg total) by mouth 2 (two) times daily for 5 days.   EYE VITAMINS PO Take 2 capsules by mouth  daily.   furosemide 40 MG tablet Commonly known as: LASIX TAKE 1 AND 1/2 TABLETS DAILY What changed: additional instructions   ketorolac 0.5 % ophthalmic solution Commonly known as: ACULAR Place 1 drop into the right eye 2 (two) times daily.   potassium chloride 10 MEQ tablet Commonly known as: KLOR-CON TAKE ONE (1) TABLET EACH DAY What changed: See the new instructions.   predniSONE 10 MG tablet Commonly known as: DELTASONE Take 10 mg by mouth daily with breakfast.   valACYclovir 1000 MG tablet Commonly known as: VALTREX Take 1,000 mg by mouth daily.      Follow-up Information    Bur[place]te, Alinda Sierras, MD. Schedule an appointment as soon as possible for a visit in 1 week(s).   Specialty: Family Medicine Contact information: [place] [address] [phone]        [place], [place]. Schedule an appointment as soon as possible for a visit in 1 week(s).   Specialty: Nurse Practitioner Contact information: [address] [place] [address] [phone]        Doran Stabler, MD. Schedule an appointment as soon as possible for a visit in 1 week(s).   Specialty: Gastroenterology Why: Hospital Follow Up Liver Ultrasound results Contact information: [address] [place] [address] [phone]          No Known Allergies Allergies as of 05/30/2019   No Known Allergies     Medication List    TAKE these medications   amLODipine 5 MG tablet Commonly known as: NORVASC TAKE ONE (1) TABLET EACH DAY What changed:   how much to take  how to take this  when to take this   azaTHIOprine 50 MG tablet Commonly known as: IMURAN Take 50 mg by mouth daily.   doxycycline 100 MG capsule Commonly known as: VIBRAMYCIN Take 1 capsule (100 mg total) by mouth 2 (two) times daily for 5 days.   EYE VITAMINS PO Take 2 capsules by mouth daily.   furosemide 40 MG tablet Commonly known as: LASIX TAKE 1 AND 1/2  TABLETS DAILY What changed: additional instructions   ketorolac 0.5 % ophthalmic solution Commonly known as: ACULAR Place 1 drop into the right eye 2 (two) times daily.   potassium chloride 10 MEQ tablet Commonly known as: KLOR-CON TAKE ONE (1) TABLET EACH DAY What changed: See the new instructions.   predniSONE 10 MG tablet Commonly known as: DELTASONE Take 10 mg by mouth daily with breakfast.   valACYclovir 1000 MG tablet Commonly known as: VALTREX Take 1,000 mg by mouth daily.       Procedures/Studies: US Venous Img Lower Bilateral  Result Date: 05/27/2019 CLINICAL DATA:  Bilateral lower extremity pain and edema, left greater than right. Evaluate for DVT. EXAM: BILATERAL LOWER EXTREMITY VENOUS DOPPLER ULTRASOUND TECHNIQUE: Gray-scale sonography with graded compression, as well as color Doppler and duplex ultrasound were performed to evaluate the lower extremity deep venous systems from the level of the common femoral vein and including the common femoral, femoral, profunda femoral, popliteal and calf veins including the posterior tibial, peroneal and gastrocnemius veins when visible. The  superficial great saphenous vein was also interrogated. Spectral Doppler was utilized to evaluate flow at rest and with distal augmentation maneuvers in the common femoral, femoral and popliteal veins. COMPARISON:  None. FINDINGS: RIGHT LOWER EXTREMITY Common Femoral Vein: No evidence of thrombus. Normal compressibility, respiratory phasicity and response to augmentation. Saphenofemoral Junction: No evidence of thrombus. Normal compressibility and flow on color Doppler imaging. Profunda Femoral Vein: No evidence of thrombus. Normal compressibility and flow on color Doppler imaging. Femoral Vein: No evidence of thrombus. Normal compressibility, respiratory phasicity and response to augmentation. Popliteal Vein: No evidence of thrombus. Normal compressibility, respiratory phasicity and response to  augmentation. Calf Veins: No evidence of thrombus. Normal compressibility and flow on color Doppler imaging. Superficial Great Saphenous Vein: No evidence of thrombus. Normal compressibility. Venous Reflux:  None. Other Findings:  None. LEFT LOWER EXTREMITY Common Femoral Vein: No evidence of thrombus. Normal compressibility, respiratory phasicity and response to augmentation. Saphenofemoral Junction: No evidence of thrombus. Normal compressibility and flow on color Doppler imaging. Profunda Femoral Vein: No evidence of thrombus. Normal compressibility and flow on color Doppler imaging. Femoral Vein: No evidence of thrombus. Normal compressibility, respiratory phasicity and response to augmentation. Popliteal Vein: No evidence of thrombus. Normal compressibility, respiratory phasicity and response to augmentation. Calf Veins: No evidence of thrombus. Normal compressibility and flow on color Doppler imaging. Superficial Great Saphenous Vein: No evidence of thrombus. Normal compressibility. Venous Reflux:  None. Other Findings:  None. IMPRESSION: No evidence of DVT within either lower extremity. Electronically Signed   By: Sandi Mariscal M.D.   On: 05/27/2019 11:14   Dg Chest Port 1 View  Result Date: 05/28/2019 CLINICAL DATA:  Fever and weakness. EXAM: PORTABLE CHEST 1 VIEW COMPARISON:  05/26/2019 FINDINGS: Heart size is normal. Increased bilateral pulmonary markings persists which could represent pneumonia or widespread bronchitis. No dense consolidation, collapse or effusion. IMPRESSION: Persistent abnormal pulmonary markings. This could represent pneumonia or widespread bronchitis. No dense consolidation or collapse. Electronically Signed   By: Nelson Chimes M.D.   On: 05/28/2019 08:20   Dg Chest Portable 1 View  Result Date: 05/26/2019 CLINICAL DATA:  Fever, weakness EXAM: PORTABLE CHEST 1 VIEW COMPARISON:  10/05/2017 FINDINGS: The heart size and mediastinal contours are within normal limits. Mild bilateral  perihilar and bibasilar interstitial opacities. No large pleural fluid collection. No pneumothorax. IMPRESSION: Mild bilateral perihilar and bibasilar interstitial opacities, right greater than left, which could represent an atypical and/or viral pneumonia. Electronically Signed   By: Davina Poke M.D.   On: 05/26/2019 19:20   Dg Knee Complete 4 Views Left  Result Date: 05/26/2019 CLINICAL DATA:  Knee stiffness EXAM: LEFT KNEE - COMPLETE 4+ VIEW COMPARISON:  None. FINDINGS: No acute fracture or malalignment. Mild tricompartmental osteoarthritis. Small enthesophyte at the quadriceps tendon insertion. No knee joint effusion. No acute soft tissue findings. IMPRESSION: No acute osseous abnormality, left knee. Electronically Signed   By: Davina Poke M.D.   On: 05/26/2019 19:21   US Abdomen Limited Ruq  Result Date: 05/29/2019 CLINICAL DATA:  Autoimmune hepatitis. EXAM: ULTRASOUND ABDOMEN LIMITED RIGHT UPPER QUADRANT COMPARISON:  None. FINDINGS: Gallbladder: There is an immobile stone in the neck of the gallbladder measuring 11.5 mm. The gallbladder wall measures 3 mm which is borderline. No Murphy's sign. Common bile duct: Diameter: 10 mm Liver: Increased echogenicity throughout the liver. No focal mass. There is a nodular contour which is relatively mild. Portal vein is patent on color Doppler imaging with normal direction of blood flow towards the  liver. Other: Perihepatic ascites identified. IMPRESSION: 1. The common bile duct measures 10 mm today versus 4.6 mm in March of 2020. Findings are concerning for obstruction and choledocholithiasis is not excluded. Recommend correlation with labs. An MRCP could better evaluate for underlying causes of biliary dilatation. 2. There is an 11.5 mm stone in the neck of the gallbladder which is not mobile. The gallbladder wall measures 3 mm which is borderline and nonspecific. No Murphy's sign or pericholecystic fluid is identified. 3. Ascites. 4. Mildly nodular  contour to the liver consistent with cirrhosis. Electronically Signed   By: Dorise Bullion III M.D   On: 05/29/2019 13:02     Subjective: Patient says he feels much better today he denies chest pain or shortness of breath.  He is eating and drinking well and ambulating well.  He denies abdominal pain.  He denies diarrhea.  He would like to go home today.  Discharge Exam: Vitals:   05/30/19 0529 05/30/19 0742  BP: 113/65   Pulse: 89   Resp: 18   Temp: 98.2 F (36.8 C)   SpO2: 93% 95%   Vitals:   05/29/19 1443 05/29/19 2132 05/30/19 0529 05/30/19 0742  BP: 118/70 113/70 113/65   Pulse: 73 82 89   Resp: 16 16 18    Temp:  98.4 F (36.9 C) 98.2 F (36.8 C)   TempSrc:  Oral Oral   SpO2: 100% 98% 93% 95%  Weight:      Height:       General: Pt is alert, awake, not in acute distress Cardiovascular: RRR, S1/S2 +, no rubs, no gallops Respiratory: CTA bilaterally, no wheezing, no rhonchi Abdominal: Soft, NT, ND, bowel sounds + Extremities: no edema, no cyanosis Neurological: Nonfocal exam   The results of significant diagnostics from this hospitalization (including imaging, microbiology, ancillary and laboratory) are listed below for reference.     Microbiology: Recent Results (from the past 240 hour(s))  Culture, blood (Routine x 2)     Status: None (Preliminary result)   Collection Time: 05/26/19  6:02 PM   Specimen: Left Antecubital; Blood  Result Value Ref Range Status   Specimen Description LEFT ANTECUBITAL  Final   Special Requests   Final    BOTTLES DRAWN AEROBIC AND ANAEROBIC Blood Culture adequate volume   Culture   Final    NO GROWTH 4 DAYS Performed at [facility], [address], [place], [place] [address]    Report Status PENDING  Incomplete  Culture, blood (Routine x 2)     Status: None (Preliminary result)   Collection Time: 05/26/19  7:18 PM   Specimen: Right Antecubital; Blood  Result Value Ref Range Status   Specimen Description RIGHT ANTECUBITAL   Final   Special Requests   Final    BOTTLES DRAWN AEROBIC AND ANAEROBIC Blood Culture adequate volume   Culture   Final    NO GROWTH 4 DAYS Performed at [facility], [address] [place], [place] [address]    Report Status PENDING  Incomplete  SARS Coronavirus 2 by RT PCR (hospital order, performed in [place] hospital lab) Nasopharyngeal Nasopharyngeal Swab     Status: None   Collection Time: 05/26/19  8:41 PM   Specimen: Nasopharyngeal Swab  Result Value Ref Range Status   SARS Coronavirus 2 NEGATIVE NEGATIVE Final    Comment: (NOTE) If result is NEGATIVE SARS-CoV-2 target nucleic acids are NOT DETECTED. The SARS-CoV-2 RNA is generally detectable in upper and lower  respiratory specimens during  the acute phase of infection. The lowest  concentration of SARS-CoV-2 viral copies this assay can detect is 250  copies / mL. A negative result does not preclude SARS-CoV-2 infection  and should not be used as the sole basis for treatment or other  patient management decisions.  A negative result may occur with  improper specimen collection / handling, submission of specimen other  than nasopharyngeal swab, presence of viral mutation(s) within the  areas targeted by this assay, and inadequate number of viral copies  (<250 copies / mL). A negative result must be combined with clinical  observations, patient history, and epidemiological information. If result is POSITIVE SARS-CoV-2 target nucleic acids are DETECTED. The SARS-CoV-2 RNA is generally detectable in upper and lower  respiratory specimens dur ing the acute phase of infection.  Positive  results are indicative of active infection with SARS-CoV-2.  Clinical  correlation with patient history and other diagnostic information is  necessary to determine patient infection status.  Positive results do  not rule out bacterial infection or co-infection with other viruses. If result is PRESUMPTIVE POSTIVE SARS-CoV-2 nucleic acids MAY  BE PRESENT.   A presumptive positive result was obtained on the submitted specimen  and confirmed on repeat testing.  While 2019 novel coronavirus  (SARS-CoV-2) nucleic acids may be present in the submitted sample  additional confirmatory testing may be necessary for epidemiological  and / or clinical management purposes  to differentiate between  SARS-CoV-2 and other Sarbecovirus currently known to infect humans.  If clinically indicated additional testing with an alternate test  methodology [phone]) is advised. The SARS-CoV-2 RNA is generally  detectable in upper and lower respiratory sp ecimens during the acute  phase of infection. The expected result is Negative. Fact Sheet for Patients:  StrictlyIdeas.no Fact Sheet for Healthcare Providers: BankingDealers.co.za This test is not yet approved or cleared by the [place] FDA and has been authorized for detection and/or diagnosis of SARS-CoV-2 by FDA under an Emergency Use Authorization (EUA).  This EUA will remain in effect (meaning this test can be used) for the duration of the COVID-19 declaration under Section 564(b)(1) of the Act, 21 U.S.C. section 360bbb-3(b)(1), unless the authorization is terminated or revoked sooner. Performed at [facility], [address], [place], [place] [address]   Culture, blood (Routine X 2) w Reflex to ID Panel     Status: None (Preliminary result)   Collection Time: 05/27/19  5:06 PM   Specimen: Left Antecubital  Result Value Ref Range Status   Specimen Description LEFT ANTECUBITAL  Final   Special Requests   Final    BOTTLES DRAWN AEROBIC AND ANAEROBIC Blood Culture adequate volume   Culture   Final    NO GROWTH 3 DAYS Performed at [facility], [address] [place], [place] [address]    Report Status PENDING  Incomplete  Culture, blood (Routine X 2) w Reflex to ID Panel     Status: None (Preliminary result)   Collection Time: 05/27/19   5:29 PM   Specimen: BLOOD LEFT HAND  Result Value Ref Range Status   Specimen Description BLOOD LEFT HAND  Final   Special Requests   Final    BOTTLES DRAWN AEROBIC AND ANAEROBIC Blood Culture adequate volume   Culture   Final    NO GROWTH 3 DAYS Performed at [facility], [address] [place],  [address]    Report Status PENDING  Incomplete     Labs: BNP (last 3 results) No  results for input(s): BNP in the last 8760 hours. Basic Metabolic Panel: Recent Labs  Lab 05/26/19 1802 05/27/19 0446 05/28/19 0816 05/29/19 0553 05/30/19 0451  NA 131* 133* 135 134* 137  K 3.6 3.5 3.4* 3.5 3.5  CL 95* 100 102 106 108  CO2 27 25 27 22 24   GLUCOSE 103* 84 75 70 61*  BUN 16 14 13 11 8   CREATININE 1.33* 1.08 1.07 0.91 0.84  CALCIUM 7.7* 7.3* 7.6* 7.4* 7.5*   Liver Function Tests: Recent Labs  Lab 05/26/19 1802 05/27/19 0446 05/28/19 0816 05/29/19 0553 05/30/19 0451  AST 45* 36 45* 41 46*  ALT 33 28 31 27 29   ALKPHOS 66 58 59 55 59  BILITOT 1.6* 1.3* 1.2 1.0 1.0  PROT 6.6 5.5* 5.8* 5.4* 5.6*  ALBUMIN 2.4* 2.0* 2.0* 1.9* 1.9*   No results for input(s): LIPASE, AMYLASE in the last 168 hours. No results for input(s): AMMONIA in the last 168 hours. CBC: Recent Labs  Lab 05/26/19 1802 05/27/19 0446 05/28/19 0816 05/29/19 0553  WBC 8.1 4.5 5.5 5.2  NEUTROABS 6.2  --  2.6 2.6  HGB 13.4 11.5* 12.3* 11.4*  HCT 39.5 34.6* 36.8* 33.1*  MCV 92.3 93.5 93.9 92.5  PLT 156 108* 129* 144*   Cardiac Enzymes: No results for input(s): CKTOTAL, CKMB, CKMBINDEX, TROPONINI in the last 168 hours. BNP: Invalid input(s): POCBNP CBG: No results for input(s): GLUCAP in the last 168 hours. D-Dimer No results for input(s): DDIMER in the last 72 hours. Hgb A1c No results for input(s): HGBA1C in the last 72 hours. Lipid Profile No results for input(s): CHOL, HDL, LDLCALC, TRIG, CHOLHDL, LDLDIRECT in the last 72 hours. Thyroid function studies No results for input(s): TSH,  T4TOTAL, T3FREE, THYROIDAB in the last 72 hours.  Invalid input(s): FREET3 Anemia work up No results for input(s): VITAMINB12, FOLATE, FERRITIN, TIBC, IRON, RETICCTPCT in the last 72 hours. Urinalysis    Component Value Date/Time   COLORURINE YELLOW 05/26/2019 2040   APPEARANCEUR CLEAR 05/26/2019 2040   LABSPEC 1.017 05/26/2019 2040   PHURINE 5.0 05/26/2019 2040   GLUCOSEU NEGATIVE 05/26/2019 2040   HGBUR NEGATIVE 05/26/2019 2040   BILIRUBINUR NEGATIVE 05/26/2019 2040   BILIRUBINUR Negative 02/08/2018 [place] 05/26/2019 2040   PROTEINUR NEGATIVE 05/26/2019 2040   UROBILINOGEN 1.0 02/08/2018 0955   NITRITE NEGATIVE 05/26/2019 2040   LEUKOCYTESUR NEGATIVE 05/26/2019 2040   Sepsis Labs Invalid input(s): PROCALCITONIN,  WBC,  LACTICIDVEN Microbiology Recent Results (from the past 240 hour(s))  Culture, blood (Routine x 2)     Status: None (Preliminary result)   Collection Time: 05/26/19  6:02 PM   Specimen: Left Antecubital; Blood  Result Value Ref Range Status   Specimen Description LEFT ANTECUBITAL  Final   Special Requests   Final    BOTTLES DRAWN AEROBIC AND ANAEROBIC Blood Culture adequate volume   Culture   Final    NO GROWTH 4 DAYS Performed at [facility], [address], [place], [place] [address]    Report Status PENDING  Incomplete  Culture, blood (Routine x 2)     Status: None (Preliminary result)   Collection Time: 05/26/19  7:18 PM   Specimen: Right Antecubital; Blood  Result Value Ref Range Status   Specimen Description RIGHT ANTECUBITAL  Final   Special Requests   Final    BOTTLES DRAWN AEROBIC AND ANAEROBIC Blood Culture adequate volume   Culture   Final    NO GROWTH 4 DAYS Performed  at [facility], [address] [place], [place] [address]    Report Status PENDING  Incomplete  SARS Coronavirus 2 by RT PCR (hospital order, performed in [facility] hospital lab) Nasopharyngeal Nasopharyngeal Swab     Status: None   Collection Time:  05/26/19  8:41 PM   Specimen: Nasopharyngeal Swab  Result Value Ref Range Status   SARS Coronavirus 2 NEGATIVE NEGATIVE Final    Comment: (NOTE) If result is NEGATIVE SARS-CoV-2 target nucleic acids are NOT DETECTED. The SARS-CoV-2 RNA is generally detectable in upper and lower  respiratory specimens during the acute phase of infection. The lowest  concentration of SARS-CoV-2 viral copies this assay can detect is 250  copies / mL. A negative result does not preclude SARS-CoV-2 infection  and should not be used as the sole basis for treatment or other  patient management decisions.  A negative result may occur with  improper specimen collection / handling, submission of specimen other  than nasopharyngeal swab, presence of viral mutation(s) within the  areas targeted by this assay, and inadequate number of viral copies  (<250 copies / mL). A negative result must be combined with clinical  observations, patient history, and epidemiological information. If result is POSITIVE SARS-CoV-2 target nucleic acids are DETECTED. The SARS-CoV-2 RNA is generally detectable in upper and lower  respiratory specimens dur ing the acute phase of infection.  Positive  results are indicative of active infection with SARS-CoV-2.  Clinical  correlation with patient history and other diagnostic information is  necessary to determine patient infection status.  Positive results do  not rule out bacterial infection or co-infection with other viruses. If result is PRESUMPTIVE POSTIVE SARS-CoV-2 nucleic acids MAY BE PRESENT.   A presumptive positive result was obtained on the submitted specimen  and confirmed on repeat testing.  While 2019 novel coronavirus  (SARS-CoV-2) nucleic acids may be present in the submitted sample  additional confirmatory testing may be necessary for epidemiological  and / or clinical management purposes  to differentiate between  SARS-CoV-2 and other Sarbecovirus currently known to  infect humans.  If clinically indicated additional testing with an alternate test  methodology [phone]) is advised. The SARS-CoV-2 RNA is generally  detectable in upper and lower respiratory sp ecimens during the acute  phase of infection. The expected result is Negative. Fact Sheet for Patients:  StrictlyIdeas.no Fact Sheet for Healthcare Providers: BankingDealers.co.za This test is not yet approved or cleared by the [place] FDA and has been authorized for detection and/or diagnosis of SARS-CoV-2 by FDA under an Emergency Use Authorization (EUA).  This EUA will remain in effect (meaning this test can be used) for the duration of the COVID-19 declaration under Section 564(b)(1) of the Act, 21 U.S.C. section 360bbb-3(b)(1), unless the authorization is terminated or revoked sooner. Performed at [facility], [address] [place], [place] [address]   Culture, blood (Routine X 2) w Reflex to ID Panel     Status: None (Preliminary result)   Collection Time: 05/27/19  5:06 PM   Specimen: Left Antecubital  Result Value Ref Range Status   Specimen Description LEFT ANTECUBITAL  Final   Special Requests   Final    BOTTLES DRAWN AEROBIC AND ANAEROBIC Blood Culture adequate volume   Culture   Final    NO GROWTH 3 DAYS Performed at [facility], [address] [place], [place] [address]    Report Status PENDING  Incomplete  Culture, blood (Routine X 2) w Reflex to ID  Panel     Status: None (Preliminary result)   Collection Time: 05/27/19  5:29 PM   Specimen: BLOOD LEFT HAND  Result Value Ref Range Status   Specimen Description BLOOD LEFT HAND  Final   Special Requests   Final    BOTTLES DRAWN AEROBIC AND ANAEROBIC Blood Culture adequate volume   Culture   Final    NO GROWTH 3 DAYS Performed at [facility], [address] [place], [place] [address]    Report Status PENDING  Incomplete   Time coordinating discharge: 31  minutes  SIGNED:  Irwin Brakeman, MD  [facility] 05/30/2019, 10:50 AM How to contact the [facility] Attending or Consulting provider [place] or covering provider during after hours [place], for this patient?  1. Check the care team in [facility] and look for a) attending/consulting [facility] provider listed and b) the [facility] team listed 2. Log into [email or web address] and use [place]'s universal password to access. If you do not have the password, please contact the hospital operator. 3. Locate the [facility] provider you are looking for under [facility] and page to a number that you can be directly reached. 4. If you still have difficulty reaching the provider, please page the [facility] (Director on Call) for the Hospitalists listed on amion for assistance.

## 2019-05-30 NOTE — Discharge Instructions (Signed)
Please review ultrasound results with your liver doctor.   Autoimmune Hepatitis, Adult  Autoimmune hepatitis is a disease in which the body's disease-fighting system (immune system) attacks the liver and causes it to become inflamed. Without treatment, this condition gets worse. It can last for years and lead to scarring of the liver (cirrhosis) and liver failure. It also increases the risk of liver cancer. Usually, the earlier a person gets treatment for this condition, the better the results will be. Most people stop having symptoms with treatment. What are the causes? The cause of this condition is not known. Genetic factors, the environment, and previous infections may play a role. What increases the risk? You are more likely to develop this condition if you:  Are male.  Are 24-64 years old. What are the signs or symptoms? Symptoms of this condition include:  Fatigue. This is the most common symptom.  A yellowish color to the skin, the whites of the eyes, and mucous membranes (jaundice).  Itching.  Rash.  Joint pain.  Discomfort in the abdomen.  Nausea.  Loss of appetite. People who have advanced disease are more likely to have symptoms such as:  Fluid in the abdomen (ascites).  Confusion.  A stop in menstrual periods (for women). How is this diagnosed? This condition is diagnosed with:  A physical exam and medical history.  Blood tests.  A liver biopsy. In this test, a sample of liver tissue is removed with a needle and examined under a microscope. How is this treated? This condition is treated with:  Medicines to prevent further damage to the liver. You may need to take these medicines for life. These may include: ? Steroids. These are medicines that treat inflammation. ? Immunosuppressants. These are medicines that reduce the activity of your immune system.  Liver transplant. You will be given a healthy liver from a donor. This is done in very severe  cases. Follow these instructions at home: Eating and drinking  Eat a healthy, balanced diet.  Limit foods that are high in fat, sugar, and salt (sodium). Do not add extra salt to your food.  Drink enough fluid to keep your urine pale yellow. This will prevent dehydration. Limit your fluid intake only if your health care provider tells you to do so.  Maintain clean spaces for meal preparation and eating. Wash your hands before and after preparing meals. General instructions  Do not drink alcohol. It may cause further damage to your liver.  Take over-the-counter and prescription medicines only as told by your health care provider.  Stay up to date on all vaccines as directed by your health care provider.  Keep all follow-up visits as told by your health care provider. This is important. Contact a health care provider if:  You have a fever.  You have new symptoms.  Your symptoms get worse.  You have increasing fatigue or weakness.  You develop jaundice, or your jaundice gets worse.  You feel nauseous or you vomit.  You develop a rash. Get help right away if:  You are unable to eat or drink.  You feel confused.  Your skin, throat, mouth, or face becomes swollen.  You have a seizure.  You become very sleepy or have trouble waking up.  You bruise or bleed easily.  You develop pain in your abdomen.  You feel dizzy.  You have difficulty walking. Summary  Autoimmune hepatitis is a disease in which the immune system attacks the liver and causes it to become  inflamed.  It can last for years and may lead to cirrhosis and liver failure.  The cause of this condition is not known. Genetic factors, the environment, and previous infections may play a role.  Symptoms may include fatigue, yellowing of the skin and eyes (jaundice), rash, joint pain, discomfort in the abdomen, and loss of appetite.  Get help right away if you are unable to eat or drink, feel confused, have  seizures, bleed easily, or feel dizzy. This information is not intended to replace advice given to you by your health care provider. Make sure you discuss any questions you have with your health care provider. Document Released: 10/18/2003 Document Revised: 11/16/2018 Document Reviewed: 12/22/2017 Elsevier Patient Education  [place] Pneumonia, Adult Pneumonia is an infection of the lungs. It causes swelling in the airways of the lungs. Mucus and fluid may also build up inside the airways. One type of pneumonia can happen while a person is in a hospital. A different type can happen when a person is not in a hospital (community-acquired pneumonia).  What are the causes?  This condition is caused by germs (viruses, bacteria, or fungi). Some types of germs can be passed from one person to another. This can happen when you breathe in droplets from the cough or sneeze of an infected person. What increases the risk? You are more likely to develop this condition if you:  Have a long-term (chronic) disease, such as: ? Chronic obstructive pulmonary disease (COPD). ? Asthma. ? Cystic fibrosis. ? Congestive heart failure. ? Diabetes. ? Kidney disease.  Have HIV.  Have sickle cell disease.  Have had your spleen removed.  Do not take good care of your teeth and mouth (poor dental hygiene).  Have a medical condition that increases the risk of breathing in droplets from your own mouth and nose.  Have a weakened body defense system (immune system).  Are a smoker.  Travel to areas where the germs that cause this illness are common.  Are around certain animals or the places they live. What are the signs or symptoms?  A dry cough.  A wet (productive) cough.  Fever.  Sweating.  Chest pain. This often happens when breathing deeply or coughing.  Fast breathing or trouble breathing.  Shortness of breath.  Shaking chills.  Feeling tired  (fatigue).  Muscle aches. How is this treated? Treatment for this condition depends on many things. Most adults can be treated at home. In some cases, treatment must happen in a hospital. Treatment may include:  Medicines given by mouth or through an IV tube.  Being given extra oxygen.  Respiratory therapy. In rare cases, treatment for very bad pneumonia may include:  Using a machine to help you breathe.  Having a procedure to remove fluid from around your lungs. Follow these instructions at home: Medicines  Take over-the-counter and prescription medicines only as told by your doctor. ? Only take cough medicine if you are losing sleep.  If you were prescribed an antibiotic medicine, take it as told by your doctor. Do not stop taking the antibiotic even if you start to feel better. General instructions   Sleep with your head and neck raised (elevated). You can do this by sleeping in a recliner or by putting a few pillows under your head.  Rest as needed. Get at least 8 hours of sleep each night.  Drink enough water to keep your pee (urine) pale yellow.  Eat a healthy diet that  includes plenty of vegetables, fruits, whole grains, low-fat dairy products, and lean protein.  Do not use any products that contain nicotine or tobacco. These include cigarettes, e-cigarettes, and chewing tobacco. If you need help quitting, ask your doctor.  Keep all follow-up visits as told by your doctor. This is important. How is this prevented? A shot (vaccine) can help prevent pneumonia. Shots are often suggested for:  People older than 82 years of age.  People older than 82 years of age who: ? Are having cancer treatment. ? Have long-term (chronic) lung disease. ? Have problems with their body's defense system. You may also prevent pneumonia if you take these actions:  Get the flu (influenza) shot every year.  Go to the dentist as often as told.  Wash your hands often. If you cannot use  soap and water, use hand sanitizer. Contact a doctor if:  You have a fever.  You lose sleep because your cough medicine does not help. Get help right away if:  You are short of breath and it gets worse.  You have more chest pain.  Your sickness gets worse. This is very serious if: ? You are an older adult. ? Your body's defense system is weak.  You cough up blood. Summary  Pneumonia is an infection of the lungs.  Most adults can be treated at home. Some will need treatment in a hospital.  Drink enough water to keep your pee pale yellow.  Get at least 8 hours of sleep each night. This information is not intended to replace advice given to you by your health care provider. Make sure you discuss any questions you have with your health care provider. Document Released: 01/14/2008 Document Revised: 11/17/2018 Document Reviewed: 03/25/2018 Elsevier Patient Education  2020 [place].   Antibiotic Medicine, Adult  Antibiotic medicines treat infections caused by a type of germ called bacteria. They work by killing the bacteria that make you sick. When do I need to take antibiotics? You often need these medicines to treat bacterial infections, such as:  A urinary tract infection (UTI).  Strep throat.  Meningitis. This affects the spinal cord and brain.  A bad lung infection. You may start the medicines while your doctor waits for tests to come back. When the tests come back, your doctor may change or stop your medicine. When are antibiotics not needed? You do not need these medicines for most common illnesses, such as:  A cold.  The flu.  A sore throat. Antibiotics are not always needed for all infections caused by bacteria. Do not ask for these medicines, or take them, when they are not needed. What are the risks of taking antibiotics? Most antibiotics can cause an infection called Clostridioides difficile (C. diff). This causes watery poop (diarrhea). Let your  doctor know right away if:  You have watery poop while taking an antibiotic.  You have watery poop after you stop taking an antibiotic. The illness can happen weeks after you stop the medicine. You also have a risk of getting an infection in the future that antibiotics cannot treat (antibiotic-resistant infection). This type of infection can be dangerous. What else should I know about taking antibiotics?   You need to take the entire prescription. ? Take the medicine for as long as told by your doctor. ? Do not stop taking it even if you start to feel better.  Try not to miss any doses. If you miss a dose, call your doctor.  Birth control pills  may not work. If you take birth control pills: ? Keep on taking them. ? Use a second form of birth control, such as a condom. Do this for as long as told by your doctor.  Ask your doctor: ? How long to wait in between doses. ? If you should take the medicine with food. ? If there is anything you should stay away from while taking the antibiotic, such as: ? Food. ? Drinks. ? Medicines. ? If there are any side effects you should watch for.  Only take the medicines that your doctor told you to take. Do not take medicines that were given to someone else.  Drink a large glass of water with the medicine.  Ask the pharmacist for a tool to measure the medicine, such as: ? A syringe. ? A cup. ? A spoon.  Throw away any extra medicine. Contact a doctor if:  You get worse.  You have new joint pain or muscle aches after starting the medicine.  You have side effects from the medicine, such as: ? Stomach pain. ? Watery poop. ? Feeling sick to your stomach (nausea). Get help right away if:  You have signs of a very bad allergic reaction. If this happens, stop taking the medicine right away. Signs may include: ? Hives. These are raised, itchy, red bumps on the skin. ? Skin rash. ? Trouble breathing. ? Wheezing. ? Swelling. ? Feeling  dizzy. ? Throwing up (vomiting).  Your pee (urine) is dark, or is the color of blood.  Your skin turns yellow.  You bruise easily.  You bleed easily.  You have very bad watery poop and cramps in your belly.  You have a very bad headache. Summary  Antibiotics are often used to treat infections caused by bacteria.  Only take these medicines when needed.  Let your doctor know if you have watery poop while taking an antibiotic.  You need to take the entire prescription. This information is not intended to replace advice given to you by your health care provider. Make sure you discuss any questions you have with your health care provider. Document Released: 05/06/2008 Document Revised: 09/03/2018 Document Reviewed: 07/30/2016 Elsevier Patient Education  2020 [place].   IMPORTANT INFORMATION: PAY CLOSE ATTENTION   PHYSICIAN DISCHARGE INSTRUCTIONS  Follow with Primary care provider  Eulas Post, MD  and other consultants as instructed by your Hospitalist Physician  [place] IF SYMPTOMS COME BACK, WORSEN OR NEW PROBLEM DEVELOPS   Please note: You were cared for by a hospitalist during your hospital stay. Every effort will be made to forward records to your primary care provider.  You can request that your primary care provider send for your hospital records if they have not received them.  Once you are discharged, your primary care physician will handle any further medical issues. Please note that NO REFILLS for any discharge medications will be authorized once you are discharged, as it is imperative that you return to your primary care physician (or establish a relationship with a primary care physician if you do not have one) for your post hospital discharge needs so that they can reassess your need for medications and monitor your lab values.  Please get a complete blood count and chemistry panel checked by your Primary MD at your  next visit, and again as instructed by your Primary MD.  Get Medicines reviewed and adjusted: Please take all your medications with you for your  next visit with your Primary MD  Laboratory/radiological data: Please request your Primary MD to go over all hospital tests and procedure/radiological results at the follow up, please ask your primary care provider to get all Hospital records sent to his/her office.  In some cases, they will be blood work, cultures and biopsy results pending at the time of your discharge. Please request that your primary care provider follow up on these results.  If you are diabetic, please bring your blood sugar readings with you to your follow up appointment with primary care.    Please call and make your follow up appointments as soon as possible.    Also Note the following: If you experience worsening of your admission symptoms, develop shortness of breath, life threatening emergency, suicidal or homicidal thoughts you must seek medical attention immediately by calling 911 or calling your MD immediately  if symptoms less severe.  You must read complete instructions/literature along with all the possible adverse reactions/side effects for all the Medicines you take and that have been prescribed to you. Take any new Medicines after you have completely understood and accpet all the possible adverse reactions/side effects.   Do not drive when taking Pain medications or sleeping medications (Benzodiazepines)  Do not take more than prescribed Pain, Sleep and Anxiety Medications. It is not advisable to combine anxiety,sleep and pain medications without talking with your primary care practitioner  Special Instructions: If you have smoked or chewed Tobacco  in the last 2 yrs please stop smoking, stop any regular Alcohol  and or any Recreational drug use.  Wear Seat belts while driving.  Do not drive if taking any narcotic, mind altering or controlled substances or  recreational drugs or alcohol.

## 2019-05-31 LAB — CULTURE, BLOOD (ROUTINE X 2)
Culture: NO GROWTH
Culture: NO GROWTH
Special Requests: ADEQUATE
Special Requests: ADEQUATE

## 2019-06-01 ENCOUNTER — Telehealth: Payer: Self-pay | Admitting: *Deleted

## 2019-06-01 LAB — CULTURE, BLOOD (ROUTINE X 2)
Culture: NO GROWTH
Culture: NO GROWTH
Special Requests: ADEQUATE
Special Requests: ADEQUATE

## 2019-06-01 NOTE — Telephone Encounter (Signed)
Transition Care Management Follow-up Telephone Call   Date discharged? 05/30/2019   How have you been since you were released from the hospital?  Per wife " he is doing pretty good but weak"    Do you understand why you were in the hospital? yes   Do you understand the discharge instructions? yes   Where were you discharged to? Home    Items Reviewed:  Medications reviewed: yes  Allergies reviewed: yes  Dietary changes reviewed: N/A   Referrals reviewed: N/A    Functional Questionnaire:   Activities of Daily Living (ADLs):   He states they are independent in the following: ambulation, bathing and hygiene, feeding, continence, grooming, toileting and dressing States they require assistance with the following: N/A    Any transportation issues/concerns?: no   Any patient concerns? no   Confirmed importance and date/time of follow-up visits scheduled yes  Provider Appointment booked with Monday 10/26 at 11AM with Dr. Elease Hashimoto.  Confirmed with patient if condition begins to worsen call PCP or go to the ER.  Patient was given the office number and encouraged to call back with question or concerns.  : yes

## 2019-06-06 ENCOUNTER — Other Ambulatory Visit: Payer: Self-pay

## 2019-06-06 ENCOUNTER — Ambulatory Visit (INDEPENDENT_AMBULATORY_CARE_PROVIDER_SITE_OTHER): Payer: Medicare Other | Admitting: Family Medicine

## 2019-06-06 ENCOUNTER — Encounter: Payer: Self-pay | Admitting: Family Medicine

## 2019-06-06 VITALS — BP 102/60 | HR 89 | Temp 97.9°F | Ht 68.0 in | Wt 207.1 lb

## 2019-06-06 DIAGNOSIS — K754 Autoimmune hepatitis: Secondary | ICD-10-CM | POA: Diagnosis not present

## 2019-06-06 DIAGNOSIS — J189 Pneumonia, unspecified organism: Secondary | ICD-10-CM

## 2019-06-06 DIAGNOSIS — I10 Essential (primary) hypertension: Secondary | ICD-10-CM | POA: Diagnosis not present

## 2019-06-06 DIAGNOSIS — K8020 Calculus of gallbladder without cholecystitis without obstruction: Secondary | ICD-10-CM

## 2019-06-06 DIAGNOSIS — K838 Other specified diseases of biliary tract: Secondary | ICD-10-CM

## 2019-06-06 NOTE — Patient Instructions (Addendum)
  Follow up immediately for any abdominal pain, fever, nausea/vomiting

## 2019-06-06 NOTE — Progress Notes (Signed)
Subjective:     Patient ID: Ethan Anderson, male   DOB: Jun 17, 1935, 84 y.o.   MRN: [id number]  HPI   Ethan Anderson is here for hospital follow-up accompanied by his wife.  He has history of hypertension, gout, autoimmune hepatitis and was brought in with generalized weakness, chills, and fever.  Chest x-ray showed question of bilateral pneumonia.  He was started on broad-spectrum antibiotics.  Covid testing was negative.  He was diagnosed with sepsis with community-acquired pneumonia.  He improved rapidly with antibiotics and fluids.  Blood cultures were negative.  Hospitalization dates were 05-26-19 through 05-30-19.    His autoimmune hepatitis is being treated with Imuran and he is being weaned off prednisone.  Abdominal ultrasound showed dilated common bile duct but normal liver enzymes and normal bilirubin so no clinical evidence for obstruction.  He has not had any abdominal pain, nausea, or vomiting.  He has hypertension which has been well controlled with amlodipine 5 mg daily.  He has had no recurrent dyspnea or other concerns since discharge.  Past Medical History:  Diagnosis Date  . Arthritis   . Cataract    ight eye and has been removed  . Colon polyps   . Depression   . Glaucoma   . HTN (hypertension)   . Macular degeneration    Past Surgical History:  Procedure Laterality Date  . CATARACT EXTRACTION Right   . COLONOSCOPY    . MULTIPLE TOOTH EXTRACTIONS     pt weard upper & lower dentures  . POLYPECTOMY    . UPPER GI ENDOSCOPY  09/21/2018    reports that he quit smoking about 28 years ago. His smoking use included cigarettes. He has a 10.00 pack-year smoking history. He has never used smokeless tobacco. He reports that he does not drink alcohol or use drugs. family history includes Alcohol abuse in his cousin; Dementia in his father; Other in his grandchild; Ovarian cancer in his mother; Stomach cancer in his mother; Thyroid cancer in his son. No Known Allergies  Ultrasound  from 05/29/2019  MPRESSION: 1. The common bile duct measures 10 mm today versus 4.6 mm in March of 2020. Findings are concerning for obstruction and choledocholithiasis is not excluded. Recommend correlation with labs. An MRCP could better evaluate for underlying causes of biliary dilatation. 2. There is an 11.5 mm stone in the neck of the gallbladder which is not mobile. The gallbladder wall measures 3 mm which is borderline and nonspecific. No Murphy's sign or pericholecystic fluid is identified. 3. Ascites. 4. Mildly nodular contour to the liver consistent with cirrhosis.    Review of Systems  Constitutional: Negative for chills and fever.  Respiratory: Negative for cough and shortness of breath.   Cardiovascular: Negative for chest pain.  Gastrointestinal: Negative for abdominal pain, nausea and vomiting.  Skin: Negative for color change and rash.  Hematological: Does not bruise/bleed easily.  Psychiatric/Behavioral: Negative for confusion.       Objective:   Physical Exam Vitals signs reviewed.  Constitutional:      Appearance: Normal appearance.  Neck:     Musculoskeletal: Neck supple.  Cardiovascular:     Rate and Rhythm: Normal rate and regular rhythm.  Pulmonary:     Effort: Pulmonary effort is normal.     Breath sounds: Normal breath sounds.  Abdominal:     Palpations: Abdomen is soft.     Tenderness: There is no abdominal tenderness. There is no guarding or rebound.     Hernia: No  hernia is present.  Skin:    Coloration: Skin is not jaundiced.  Neurological:     Mental Status: He is alert.        Assessment:     #1 recent possible bilateral community acquired pneumonia.  Clinically improved and now off antibiotics.  No recurrent fever.  Question viral pneumonia.  Blood cultures negative.  #2 history of autoimmune hepatitis followed by hepatologist  #3 recent ultrasound revealing gallbladder stone in the gallbladder neck with dilated common duct but  no evidence for elevated liver enzymes or clinical evidence for obstruction  #4 hypertension stable    Plan:     -We discussed possible MRCP but given his normal enzymes and no clinical symptoms will defer to hepatologist at this point.  He knows to follow-up immediately for any nausea, vomiting, abdominal pain, or other concerns -Flu vaccine already given -Routine follow-up in 3 months and sooner as needed  Eulas Post MD [facility]

## 2019-06-14 ENCOUNTER — Other Ambulatory Visit (HOSPITAL_COMMUNITY): Payer: Self-pay | Admitting: Nurse Practitioner

## 2019-06-14 ENCOUNTER — Telehealth: Payer: Self-pay | Admitting: *Deleted

## 2019-06-14 ENCOUNTER — Other Ambulatory Visit: Payer: Self-pay | Admitting: Nurse Practitioner

## 2019-06-14 DIAGNOSIS — K838 Other specified diseases of biliary tract: Secondary | ICD-10-CM

## 2019-06-21 ENCOUNTER — Other Ambulatory Visit: Payer: Self-pay

## 2019-06-21 ENCOUNTER — Other Ambulatory Visit (HOSPITAL_COMMUNITY): Payer: Self-pay | Admitting: Nurse Practitioner

## 2019-06-21 ENCOUNTER — Ambulatory Visit (HOSPITAL_COMMUNITY)
Admission: RE | Admit: 2019-06-21 | Discharge: 2019-06-21 | Disposition: A | Discharge status: Home / Self Care | Payer: Medicare Other | Source: Ambulatory Visit | Attending: Nurse Practitioner | Admitting: Nurse Practitioner

## 2019-06-21 DIAGNOSIS — K838 Other specified diseases of biliary tract: Secondary | ICD-10-CM | POA: Insufficient documentation

## 2019-06-21 MED ORDER — GADOXETATE DISODIUM 0.25 MMOL/ML IV SOLN
9.0000 mL | Freq: Once | INTRAVENOUS | Status: AC | PRN
  Administered 2019-06-21: 9 mL via INTRAVENOUS

## 2019-07-19 ENCOUNTER — Ambulatory Visit (INDEPENDENT_AMBULATORY_CARE_PROVIDER_SITE_OTHER): Payer: Medicare Other | Admitting: Gastroenterology

## 2019-07-19 ENCOUNTER — Encounter: Payer: Self-pay | Admitting: Gastroenterology

## 2019-07-19 VITALS — BP 134/70 | HR 120 | Temp 98.3°F | Ht 68.0 in | Wt 203.0 lb

## 2019-07-19 DIAGNOSIS — K8020 Calculus of gallbladder without cholecystitis without obstruction: Secondary | ICD-10-CM | POA: Diagnosis not present

## 2019-07-19 DIAGNOSIS — K754 Autoimmune hepatitis: Secondary | ICD-10-CM

## 2019-07-19 DIAGNOSIS — R748 Abnormal levels of other serum enzymes: Secondary | ICD-10-CM

## 2019-07-19 DIAGNOSIS — K7469 Other cirrhosis of liver: Secondary | ICD-10-CM | POA: Diagnosis not present

## 2019-07-19 NOTE — Progress Notes (Signed)
[facility] Progress Note  Chief Complaint: Autoimmune hepatitis  Subjective  History: Autoimmune hepatitis, meld score 8, evaluated and treated by [facility] hepatology clinic, most recently late October of this year.  With his initial diagnosis, LFTs did not meet treatment criteria.  Transaminases were increasing over the summer, AST predominant.  Hepatology began prednisone 20 mg a day azathioprine 50 mg daily, with a more recent increase the azathioprine to 75 mg daily. Incidental gallstone on most recent ultrasound.  Hospital admission mid-October for community-acquired pneumonia, Covid negative.  He has recovered from pneumonia, and is generally feeling well.  Prednisone was discontinued on time in the last 4 to 6 weeks.  He also had labs with the hepatology clinic 2 weeks ago but has not yet received the results.  Hepatology and primary care were concerned about the elevated LFTs in the setting of an enlarging gallstone, so he also underwent MRCP with results noted below.  ROS: Cardiovascular:  no chest pain Respiratory: no dyspnea No cough sputum production or dyspnea presently  The patient's Past Medical, Family and Social History were reviewed and are on file in the EMR.  Objective:  Med list reviewed  Current Outpatient Medications:  .  amLODipine (NORVASC) 5 MG tablet, TAKE ONE (1) TABLET EACH DAY (Patient taking differently: Take 5 mg by mouth daily. TAKE ONE (1) TABLET EACH DAY), Disp: 90 tablet, Rfl: 3 .  azaTHIOprine (IMURAN) 50 MG tablet, Take 75 mg by mouth daily. , Disp: , Rfl:  .  furosemide (LASIX) 40 MG tablet, TAKE 1 AND 1/2 TABLETS DAILY (Patient taking differently: Take 60 mg by mouth daily. TAKE 1 AND 1/2 TABLETS DAILY), Disp: 135 tablet, Rfl: 0 .  ketorolac (ACULAR) 0.5 % ophthalmic solution, Place 1 drop into the right eye 2 (two) times daily. , Disp: , Rfl:  .  Multiple Vitamins-Minerals (EYE VITAMINS PO), Take 2 capsules by mouth daily. , Disp: ,  Rfl:  .  potassium chloride (K-DUR) 10 MEQ tablet, TAKE ONE (1) TABLET EACH DAY (Patient taking differently: Take 10 mEq by mouth. ), Disp: 90 tablet, Rfl: 1   Vital signs in last 24 hrs: Vitals:   07/19/19 1327  BP: 134/70  Pulse: (!) 120  Temp: 98.3 F (36.8 C)    Physical Exam  He is well-appearing, and accompanied by his wife  HEENT: sclera anicteric, oral mucosa moist without lesions  Neck: supple, no thyromegaly, JVD or lymphadenopathy  Cardiac: RRR without murmurs, S1S2 heard, trace pretibial edema laterally   Pulm: clear to auscultation bilaterally, normal RR and effort noted  Abdomen: soft, no tenderness, with active bowel sounds. No guarding or palpable hepatosplenomegaly.  Skin; warm and dry, no jaundice or rash  Recent Labs:  CMP Latest Ref Rng & Units 05/30/2019 05/29/2019 05/28/2019  Glucose 70 - 99 mg/dL 61(L) 70 75  BUN 8 - 23 mg/dL 8 11 13   Creatinine 0.61 - 1.24 mg/dL 0.84 0.91 1.07  Sodium 135 - 145 mmol/L 137 134(L) 135  Potassium 3.5 - 5.1 mmol/L 3.5 3.5 3.4(L)  Chloride 98 - 111 mmol/L 108 106 102  CO2 22 - 32 mmol/L 24 22 27   Calcium 8.9 - 10.3 mg/dL 7.5(L) 7.4(L) 7.6(L)  Total Protein 6.5 - 8.1 g/dL 5.6(L) 5.4(L) 5.8(L)  Total Bilirubin 0.3 - 1.2 mg/dL 1.0 1.0 1.2  Alkaline Phos 38 - 126 U/L 59 55 59  AST 15 - 41 U/L 46(H) 41 45(H)  ALT 0 - 44 U/L 29 27 31  Radiologic studies:  CLINICAL DATA:  History of autoimmune hepatitis with common bile duct dilation.   EXAM: MRI ABDOMEN WITHOUT AND WITH CONTRAST (INCLUDING MRCP)   TECHNIQUE: Multiplanar multisequence MR imaging of the abdomen was performed both before and after the administration of intravenous contrast. Heavily T2-weighted images of the biliary and pancreatic ducts were obtained, and three-dimensional MRCP images were rendered by post processing.   CONTRAST:  80mL EOVIST GADOXETATE DISODIUM 0.25 MOL/L IV SOLN   COMPARISON:  None.   FINDINGS: Lower chest: No signs of  pleural effusion or consolidation. Limited assessment of the lung bases.   Hepatobiliary: Classic hepatic arterial anatomy arising from the celiac axis. 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   Normal excretion of hepatocytes specific contrast into the biliary tree at 20 minutes and small bowel.   Lack of gallbladder filling at 20 minutes with stable appearance of large gallbladder neck/cystic duct calculus measuring approximately 1.5 cm.   Pancreas: Tiny 5 mm cystic lesion in the tail of the pancreas. Normal caliber main pancreatic duct.   Spleen: Spleen is mildly enlarged with portosystemic collaterals about the spleen and in the upper abdomen.   Adrenals/Urinary Tract:  No signs of focal adrenal abnormality.   No signs of hydronephrosis or renal lesion.   Stomach/Bowel: Signs of hiatal hernia. The bowel is otherwise normal to the extent visualized.   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   Other:  Trace ascites about the liver.   Musculoskeletal: No suspicious bone lesions identified.   IMPRESSION: 1. Nodular hepatic contour and portosystemic collaterals in the upper abdomen, likely related to cirrhosis with portal hypertension in the setting of autoimmune hepatitis. 2. Mild biliary ductal irregularity and dilation without signs of biliary ductal obstruction, attention on follow-up and correlate with any transient elevation of alkaline phosphatase due to possibility of autoimmune hepatitis/PSC overlap, findings remain nonspecific at this time, attention on follow-up. 3. Large gallstone in the neck of the gallbladder as before, no filling of the gallbladder at 20 minutes, nonspecific but could represent cystic duct obstruction. Correlate with any symptoms of biliary colic.  No signs of acute gallbladder inflammation. 4. 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 5. Signs of hiatal hernia. 6. Tiny 5 mm cystic lesion in the tail of the pancreas. Recommend continued attention on follow-up imaging, no specific follow-up recommendations are made based on patient age.     Electronically Signed   By: Zetta Bills M.D.   On: 06/21/2019 09:21   @ASSESSMENTPLANBEGIN @ Assessment: Encounter Diagnoses  Name Primary?  . Autoimmune hepatitis ([place]) Yes  . Other cirrhosis of liver ([place])   . Abnormal transaminases   . Calculus of gallbladder without cholecystitis without obstruction    Stable autoimmune hepatitis, LFTs improved on low-dose immunosuppression.  Has close follow-up with hepatology clinic, who manages this treatment.  Asymptomatic gallstone, recommend observation.  I described biliary colic so they could alert [place] if that should occur. Plan: I will see him in 6 months or sooner as needed.   Total time 20 minutes, over half spent face-to-face with patient in counseling and coordination of care.   Nelida Meuse III

## 2019-07-19 NOTE — Patient Instructions (Signed)
If you are age 82 or older, your body mass index should be between 23-30. Your Body mass index is 30.87 kg/m. If this is out of the aforementioned range listed, please consider follow up with your Primary Care Provider.  If you are age 81 or younger, your body mass index should be between 19-25. Your Body mass index is 30.87 kg/m. If this is out of the aformentioned range listed, please consider follow up with your Primary Care Provider.   It was a pleasure to see you today!  Dr. Loletha Carrow

## 2019-07-24 ENCOUNTER — Other Ambulatory Visit: Payer: Self-pay | Admitting: Family Medicine

## 2019-09-05 ENCOUNTER — Other Ambulatory Visit: Payer: Self-pay

## 2019-09-06 ENCOUNTER — Other Ambulatory Visit: Payer: Self-pay

## 2019-09-06 ENCOUNTER — Ambulatory Visit (INDEPENDENT_AMBULATORY_CARE_PROVIDER_SITE_OTHER): Payer: Medicare Other | Admitting: Family Medicine

## 2019-09-06 ENCOUNTER — Encounter: Payer: Self-pay | Admitting: Family Medicine

## 2019-09-06 VITALS — BP 116/64 | HR 87 | Temp 98.0°F | Ht 68.0 in | Wt 203.7 lb

## 2019-09-06 DIAGNOSIS — I10 Essential (primary) hypertension: Secondary | ICD-10-CM | POA: Diagnosis not present

## 2019-09-06 DIAGNOSIS — K754 Autoimmune hepatitis: Secondary | ICD-10-CM

## 2019-09-06 NOTE — Progress Notes (Signed)
Subjective:     Patient ID: Ethan Anderson, male   DOB: 1935-08-08, 85 y.o.   MRN: [id number]  HPI   Ethan Anderson is here for routine medical follow-up.  Refer to previous note for details.  He had community-acquired pneumonia and sepsis back in the Fall and is fully recovered at this time.  He is followed through hepatology clinic every 3 months and states he just had lab work last week.  He and his wife both had their first Covid vaccine little over week ago.  They are in process of trying to get signed up for the second one  Generally feels well.  He has had history of peripheral edema issues and hypertension.  He takes Lasix and edema has been well controlled.  Last albumin on record was 1.9.  No dyspnea.  No chest pains.  Past Medical History:  Diagnosis Date  . Arthritis   . Cataract    ight eye and has been removed  . Colon polyps   . Depression   . Glaucoma   . HTN (hypertension)   . Macular degeneration    Past Surgical History:  Procedure Laterality Date  . CATARACT EXTRACTION Right   . COLONOSCOPY    . MULTIPLE TOOTH EXTRACTIONS     pt weard upper & lower dentures  . POLYPECTOMY    . UPPER GI ENDOSCOPY  09/21/2018    reports that he quit smoking about 29 years ago. His smoking use included cigarettes. He has a 10.00 pack-year smoking history. He has never used smokeless tobacco. He reports that he does not drink alcohol or use drugs. family history includes Alcohol abuse in his cousin; Dementia in his father; Other in his grandchild; Ovarian cancer in his mother; Stomach cancer in his mother; Thyroid cancer in his son. No Known Allergies   Review of Systems  Constitutional: Negative for fatigue.  Eyes: Negative for visual disturbance.  Respiratory: Negative for cough, chest tightness and shortness of breath.   Cardiovascular: Negative for chest pain, palpitations and leg swelling.  Neurological: Negative for dizziness, syncope, weakness, light-headedness and headaches.        Objective:   Physical Exam Constitutional:      Appearance: He is well-developed.  HENT:     Right Ear: External ear normal.     Left Ear: External ear normal.  Eyes:     Pupils: Pupils are equal, round, and reactive to light.  Neck:     Thyroid: No thyromegaly.  Cardiovascular:     Rate and Rhythm: Normal rate and regular rhythm.  Pulmonary:     Effort: Pulmonary effort is normal. No respiratory distress.     Breath sounds: Normal breath sounds. No wheezing or rales.  Musculoskeletal:     Cervical back: Neck supple.  Neurological:     Mental Status: He is alert and oriented to person, place, and time.        Assessment:     #1 hypertension stable and at goal  #2 history of peripheral edema probably multifactorial currently stable.  He does have history of low albumin which is certainly contributing to some extent  #3 autoimmune hepatitis followed by hepatology clinic.  He is currently on Imuran    Plan:     -Continue current medication regimen -We did not obtain electrolytes since he had recent reported CMet through hepatology clinic -Recommend routine follow-up in 1 year and sooner as needed -He and wife in middle of Covid vaccine series.  Ethan Post MD [facility]

## 2019-10-10 ENCOUNTER — Ambulatory Visit: Payer: Medicare Other | Admitting: Family Medicine

## 2019-10-18 ENCOUNTER — Other Ambulatory Visit: Payer: Self-pay | Admitting: Family Medicine

## 2019-12-12 ENCOUNTER — Other Ambulatory Visit: Payer: Self-pay | Admitting: Family Medicine

## 2019-12-28 ENCOUNTER — Other Ambulatory Visit: Payer: Self-pay | Admitting: Nurse Practitioner

## 2019-12-28 DIAGNOSIS — K7469 Other cirrhosis of liver: Secondary | ICD-10-CM

## 2020-01-03 ENCOUNTER — Ambulatory Visit
Admission: RE | Admit: 2020-01-03 | Discharge: 2020-01-03 | Disposition: A | Discharge status: Home / Self Care | Payer: Medicare Other | Source: Ambulatory Visit | Attending: Nurse Practitioner | Admitting: Nurse Practitioner

## 2020-01-03 DIAGNOSIS — K7469 Other cirrhosis of liver: Secondary | ICD-10-CM

## 2020-03-24 ENCOUNTER — Other Ambulatory Visit: Payer: Self-pay | Admitting: Family Medicine

## 2020-04-04 ENCOUNTER — Other Ambulatory Visit: Payer: Self-pay

## 2020-04-04 ENCOUNTER — Encounter (INDEPENDENT_AMBULATORY_CARE_PROVIDER_SITE_OTHER): Payer: Medicare Other | Admitting: Ophthalmology

## 2020-04-04 DIAGNOSIS — H348122 Central retinal vein occlusion, left eye, stable: Secondary | ICD-10-CM | POA: Diagnosis not present

## 2020-04-04 DIAGNOSIS — I10 Essential (primary) hypertension: Secondary | ICD-10-CM | POA: Diagnosis not present

## 2020-04-04 DIAGNOSIS — H35033 Hypertensive retinopathy, bilateral: Secondary | ICD-10-CM

## 2020-04-04 DIAGNOSIS — H43813 Vitreous degeneration, bilateral: Secondary | ICD-10-CM

## 2020-04-04 DIAGNOSIS — H353112 Nonexudative age-related macular degeneration, right eye, intermediate dry stage: Secondary | ICD-10-CM | POA: Diagnosis not present

## 2020-04-07 ENCOUNTER — Other Ambulatory Visit: Payer: Self-pay | Admitting: Family Medicine

## 2020-04-16 ENCOUNTER — Other Ambulatory Visit: Payer: Self-pay | Admitting: Family Medicine

## 2020-04-21 NOTE — Telephone Encounter (Signed)
Needs follow up and labs.  Refill for one month and have them set up f/u

## 2020-04-23 NOTE — Telephone Encounter (Signed)
Spoke with the pts wife and informed her of the message below.  Appt scheduled for 9/27 to arrive at 1:45pm.

## 2020-05-07 ENCOUNTER — Ambulatory Visit (INDEPENDENT_AMBULATORY_CARE_PROVIDER_SITE_OTHER): Payer: Medicare Other | Admitting: Family Medicine

## 2020-05-07 ENCOUNTER — Encounter: Payer: Self-pay | Admitting: Family Medicine

## 2020-05-07 ENCOUNTER — Other Ambulatory Visit: Payer: Self-pay

## 2020-05-07 VITALS — BP 138/70 | HR 86 | Temp 98.0°F | Ht 68.0 in | Wt 197.8 lb

## 2020-05-07 DIAGNOSIS — I10 Essential (primary) hypertension: Secondary | ICD-10-CM | POA: Diagnosis not present

## 2020-05-07 DIAGNOSIS — K754 Autoimmune hepatitis: Secondary | ICD-10-CM

## 2020-05-07 DIAGNOSIS — Z23 Encounter for immunization: Secondary | ICD-10-CM | POA: Diagnosis not present

## 2020-05-07 DIAGNOSIS — R600 Localized edema: Secondary | ICD-10-CM | POA: Diagnosis not present

## 2020-05-07 MED ORDER — POTASSIUM CHLORIDE ER 10 MEQ PO TBCR: EXTENDED_RELEASE_TABLET | ORAL | 3 refills | Status: DC

## 2020-05-07 NOTE — Addendum Note (Signed)
Addended by: Anibal Henderson on: 05/07/2020 02:33 PM   Modules accepted: Orders

## 2020-05-07 NOTE — Progress Notes (Signed)
Established Patient Office Visit  Subjective:  Patient ID: Ethan Anderson, male    DOB: 07-27-1935  Age: 85 y.o. MRN: [id number]  CC:  Chief Complaint  Patient presents with  . Follow-up    needs labs, needs potassium    HPI Ethan Anderson presents for medical follow-up.  He has history of hypertension, autoimmune hepatitis followed by hepatologist with [facility].  He also has history of glaucoma and gout.  No recent gout flareups.  He remains on Imuran per hematology clinic.  His other medications include amlodipine 5 mg daily, furosemide 40 mg 1-1/2 tablets daily, and potassium.  He has had history of peripheral edema previously.  Low albumin from his chronic liver disease.  Edema currently stable.  He needs follow-up basic metabolic panel.  Also needs refills of his potassium supplement.  He has had Covid vaccine as well as booster.  Needs flu vaccine.  Past Medical History:  Diagnosis Date  . Arthritis   . Cataract    ight eye and has been removed  . Colon polyps   . Depression   . Glaucoma   . HTN (hypertension)   . Macular degeneration     Past Surgical History:  Procedure Laterality Date  . CATARACT EXTRACTION Right   . COLONOSCOPY    . MULTIPLE TOOTH EXTRACTIONS     pt weard upper & lower dentures  . POLYPECTOMY    . UPPER GI ENDOSCOPY  09/21/2018    Family History  Problem Relation Age of Onset  . Ovarian cancer Mother   . Stomach cancer Mother   . Dementia Father   . Alcohol abuse Cousin   . Thyroid cancer Son   . Other Grandchild        Primary Sclerosing Cholangitis  . Colon cancer Neg Hx   . Esophageal cancer Neg Hx   . Rectal cancer Neg Hx   . Pancreatic cancer Neg Hx   . Prostate cancer Neg Hx     Social History   Socioeconomic History  . Marital status: Married    Spouse name: Doris  . Number of children: 6  . Years of education: Not on file  . Highest education level: Not on file  Occupational History  . Occupation: retired    Tobacco Use  . Smoking status: Former Smoker    Packs/day: 1.00    Years: 10.00    Pack years: 10.00    Types: Cigarettes    Quit date: 08/18/1990    Years since quitting: 29.7  . Smokeless tobacco: Never Used  Vaping Use  . Vaping Use: Never used  Substance and Sexual Activity  . Alcohol use: No  . Drug use: No  . Sexual activity: Not on file  Other Topics Concern  . Not on file  Social History Narrative  . Not on file   Social Determinants of Health   Financial Resource Strain:   . Difficulty of Paying Living Expenses: Not on file  Food Insecurity:   . Worried About Charity fundraiser in the Last Year: Not on file  . Ran Out of Food in the Last Year: Not on file  Transportation Needs:   . Lack of Transportation (Medical): Not on file  . Lack of Transportation (Non-Medical): Not on file  Physical Activity:   . Days of Exercise per Week: Not on file  . Minutes of Exercise per Session: Not on file  Stress:   . Feeling of Stress :  Not on file  Social Connections:   . Frequency of Communication with Friends and Family: Not on file  . Frequency of Social Gatherings with Friends and Family: Not on file  . Attends Religious Services: Not on file  . Active Member of Clubs or Organizations: Not on file  . Attends Archivist Meetings: Not on file  . Marital Status: Not on file  Intimate Partner Violence:   . Fear of Current or Ex-Partner: Not on file  . Emotionally Abused: Not on file  . Physically Abused: Not on file  . Sexually Abused: Not on file    Outpatient Medications Prior to Visit  Medication Sig Dispense Refill  . amLODipine (NORVASC) 5 MG tablet TAKE ONE (1) TABLET EACH DAY 90 tablet 2  . azaTHIOprine (IMURAN) 50 MG tablet Take 100 mg by mouth daily.     . furosemide (LASIX) 40 MG tablet TAKE 1 AND 1/2 TABLETS DAILY (Patient taking differently: Take 40 mg by mouth daily. TAKE 1 TABLET BY MOUTH ONCE DAILY) 135 tablet 1  . latanoprost (XALATAN) 0.005 %  ophthalmic solution Place 1 drop into the right eye at bedtime.    Marland Kitchen LUMIGAN 0.01 % SOLN Place 1 drop into the right eye at bedtime.    . Multiple Vitamins-Minerals (EYE VITAMINS PO) Take 2 capsules by mouth daily.     . potassium chloride (KLOR-CON) 10 MEQ tablet TAKE ONE (1) TABLET EACH DAY 30 tablet 0  . ketorolac (ACULAR) 0.5 % ophthalmic solution Place 1 drop into the right eye 2 (two) times daily.  (Patient not taking: Reported on 05/07/2020)     No facility-administered medications prior to visit.    No Known Allergies  ROS Review of Systems  Constitutional: Negative for fatigue and unexpected weight change.  Eyes: Negative for visual disturbance.  Respiratory: Negative for cough, chest tightness and shortness of breath.   Cardiovascular: Negative for chest pain, palpitations and leg swelling.  Neurological: Negative for dizziness, syncope, weakness, light-headedness and headaches.      Objective:    Physical Exam Constitutional:      Appearance: He is well-developed.  HENT:     Right Ear: External ear normal.     Left Ear: External ear normal.  Eyes:     Pupils: Pupils are equal, round, and reactive to light.  Neck:     Thyroid: No thyromegaly.  Cardiovascular:     Rate and Rhythm: Normal rate and regular rhythm.  Pulmonary:     Effort: Pulmonary effort is normal. No respiratory distress.     Breath sounds: Normal breath sounds. No wheezing or rales.  Musculoskeletal:     Cervical back: Neck supple.     Right lower leg: No edema.     Left lower leg: No edema.  Neurological:     Mental Status: He is alert and oriented to person, place, and time.     BP 138/70 (BP Location: Left Arm, Cuff Size: Normal)   Pulse 86   Temp 98 F (36.7 C) (Oral)   Ht 5\' 8"  (1.727 m)   Wt 197 lb 12.8 oz (89.7 kg)   SpO2 98%   BMI 30.08 kg/m  Wt Readings from Last 3 Encounters:  05/07/20 197 lb 12.8 oz (89.7 kg)  09/06/19 203 lb 11.2 oz (92.4 kg)  07/19/19 203 lb (92.1 kg)      Health Maintenance Due  Topic Date Due  . INFLUENZA VACCINE  03/11/2020    There are no  preventive care reminders to display for this patient.  Lab Results  Component Value Date   TSH 5.52 (H) 02/08/2018   Lab Results  Component Value Date   WBC 5.2 05/29/2019   HGB 11.4 (L) 05/29/2019   HCT 33.1 (L) 05/29/2019   MCV 92.5 05/29/2019   PLT 144 (L) 05/29/2019   Lab Results  Component Value Date   NA 137 05/30/2019   K 3.5 05/30/2019   CO2 24 05/30/2019   GLUCOSE 61 (L) 05/30/2019   BUN 8 05/30/2019   CREATININE 0.84 05/30/2019   BILITOT 1.0 05/30/2019   ALKPHOS 59 05/30/2019   AST 46 (H) 05/30/2019   ALT 29 05/30/2019   PROT 5.6 (L) 05/30/2019   ALBUMIN 1.9 (L) 05/30/2019   CALCIUM 7.5 (L) 05/30/2019   ANIONGAP 5 05/30/2019   GFR 92.96 04/04/2019   Lab Results  Component Value Date   CHOL 215 (H) 11/24/2016   Lab Results  Component Value Date   HDL 51.50 11/24/2016   Lab Results  Component Value Date   LDLCALC 137 (H) 11/24/2016   Lab Results  Component Value Date   TRIG 130.0 11/24/2016   Lab Results  Component Value Date   CHOLHDL 4 11/24/2016   No results found for: HGBA1C    Assessment & Plan:   #1 hypertension-stable. -Continue current medications  #2 history of peripheral edema currently stable on furosemide -Try scaling back his furosemide from 60 mg daily to 40 mg daily and watch for any recurrent edema or weight gain  -Check basic metabolic panel  -Refill potassium supplement for 1 year  #3 health maintenance -Flu vaccine given  #4 history of autoimmune hepatitis currently stable on Imuran and followed through hepatology clinic  Meds ordered this encounter  Medications  . potassium chloride (KLOR-CON) 10 MEQ tablet    Sig: TAKE ONE (1) TABLET EACH DAY    Dispense:  90 tablet    Refill:  3    Patient needs an appt    Follow-up: No follow-ups on file.    Carolann Littler, MD

## 2020-05-07 NOTE — Patient Instructions (Signed)
Decrease the Furosemide to 40 mg ONE DAILY'  Watch for any leg swelling or weight gain.

## 2020-05-08 LAB — BASIC METABOLIC PANEL
BUN: 14 mg/dL (ref 7–25)
CO2: 30 mmol/L (ref 20–32)
Calcium: 9 mg/dL (ref 8.6–10.3)
Chloride: 101 mmol/L (ref 98–110)
Creat: 1.02 mg/dL (ref 0.70–1.11)
Glucose, Bld: 94 mg/dL (ref 65–99)
Potassium: 3.7 mmol/L (ref 3.5–5.3)
Sodium: 138 mmol/L (ref 135–146)

## 2020-06-13 ENCOUNTER — Ambulatory Visit (INDEPENDENT_AMBULATORY_CARE_PROVIDER_SITE_OTHER): Payer: Medicare Other

## 2020-06-13 DIAGNOSIS — Z0000 Encounter for general adult medical examination without abnormal findings: Secondary | ICD-10-CM

## 2020-06-13 NOTE — Progress Notes (Signed)
Subjective:   Ethan Anderson is a 85 y.o. male who presents for Medicare Annual/Subsequent preventive examination.  I connected with Ethan Anderson  today by telephone and verified that I am speaking with the correct person using two identifiers. Location patient: home Location provider: work Persons participating in the virtual visit: patient, provider.   I discussed the limitations, risks, security and privacy concerns of performing an evaluation and management service by telephone and the availability of in person appointments. I also discussed with the patient that there may be a patient responsible charge related to this service. The patient expressed understanding and verbally consented to this telephonic visit.    Interactive audio and video telecommunications were attempted between this provider and patient, however failed, due to patient having technical difficulties OR patient did not have access to video capability.  We continued and completed visit with audio only.      Review of Systems    N/A  Cardiac Risk Factors include: advanced age (>46men, >8 women);hypertension;male gender     Objective:    Today's Vitals   There is no height or weight on file to calculate BMI.  Advanced Directives 06/13/2020 05/27/2019 05/26/2019  Does Patient Have a Medical Advance Directive? No Yes No  Type of Advance Directive - Living will -  Does patient want to make changes to medical advance directive? - No - Patient declined -  Would patient like information on creating a medical advance directive? No - Patient declined No - Patient declined No - Patient declined    Current Medications (verified) Outpatient Encounter Medications as of 06/13/2020  Medication Sig  . amLODipine (NORVASC) 5 MG tablet TAKE ONE (1) TABLET EACH DAY  . azaTHIOprine (IMURAN) 50 MG tablet Take 100 mg by mouth daily.   . furosemide (LASIX) 40 MG tablet TAKE 1 AND 1/2 TABLETS DAILY (Patient taking differently:  Take 40 mg by mouth daily. TAKE 1 TABLET BY MOUTH ONCE DAILY)  . ketorolac (ACULAR) 0.5 % ophthalmic solution Place 1 drop into the right eye 2 (two) times daily.   Marland Kitchen latanoprost (XALATAN) 0.005 % ophthalmic solution Place 1 drop into the right eye at bedtime.  Marland Kitchen LUMIGAN 0.01 % SOLN Place 1 drop into the right eye at bedtime.  . Multiple Vitamins-Minerals (EYE VITAMINS PO) Take 2 capsules by mouth daily.   . potassium chloride (KLOR-CON) 10 MEQ tablet TAKE ONE (1) TABLET EACH DAY   No facility-administered encounter medications on file as of 06/13/2020.    Allergies (verified) Patient has no known allergies.   History: Past Medical History:  Diagnosis Date  . Arthritis   . Cataract    ight eye and has been removed  . Colon polyps   . Depression   . Glaucoma   . HTN (hypertension)   . Macular degeneration    Past Surgical History:  Procedure Laterality Date  . CATARACT EXTRACTION Right   . COLONOSCOPY    . MULTIPLE TOOTH EXTRACTIONS     pt weard upper & lower dentures  . POLYPECTOMY    . UPPER GI ENDOSCOPY  09/21/2018   Family History  Problem Relation Age of Onset  . Ovarian cancer Mother   . Stomach cancer Mother   . Dementia Father   . Alcohol abuse Cousin   . Thyroid cancer Son   . Other Grandchild        Primary Sclerosing Cholangitis  . Colon cancer Neg Hx   . Esophageal cancer Neg Hx   .  Rectal cancer Neg Hx   . Pancreatic cancer Neg Hx   . Prostate cancer Neg Hx    Social History   Socioeconomic History  . Marital status: Married    Spouse name: Ethan Anderson  . Number of children: 6  . Years of education: Not on file  . Highest education level: Not on file  Occupational History  . Occupation: retired  Tobacco Use  . Smoking status: Former Smoker    Packs/day: 1.00    Years: 10.00    Pack years: 10.00    Types: Cigarettes    Quit date: 08/18/1990    Years since quitting: 29.8  . Smokeless tobacco: Never Used  Vaping Use  . Vaping Use: Never used    Substance and Sexual Activity  . Alcohol use: No  . Drug use: No  . Sexual activity: Not on file  Other Topics Concern  . Not on file  Social History Narrative  . Not on file   Social Determinants of Health   Financial Resource Strain: Low Risk   . Difficulty of Paying Living Expenses: Not hard at all  Food Insecurity: No Food Insecurity  . Worried About Charity fundraiser in the Last Year: Never true  . Ran Out of Food in the Last Year: Never true  Transportation Needs: No Transportation Needs  . Lack of Transportation (Medical): No  . Lack of Transportation (Non-Medical): No  Physical Activity: Inactive  . Days of Exercise per Week: 0 days  . Minutes of Exercise per Session: 0 min  Stress: No Stress Concern Present  . Feeling of Stress : Not at all  Social Connections: Moderately Integrated  . Frequency of Communication with Friends and Family: More than three times a week  . Frequency of Social Gatherings with Friends and Family: More than three times a week  . Attends Religious Services: 1 to 4 times per year  . Active Member of Clubs or Organizations: No  . Attends Archivist Meetings: Never  . Marital Status: Married    Tobacco Counseling Counseling given: Not Answered   Clinical Intake:  Pre-visit preparation completed: Yes  Pain : No/denies pain     Nutritional Risks: None Diabetes: No  How often do you need to have someone help you when you read instructions, pamphlets, or other written materials from your doctor or pharmacy?: 1 - Never What is the last grade level you completed in school?: High School Graduate  Diabetic?No  Interpreter Needed?: No  Information entered by :: [place] of Daily Living In your present state of health, do you have any difficulty performing the following activities: 06/13/2020  Hearing? N  Vision? N  Difficulty concentrating or making decisions? N  Walking or climbing stairs? N  Dressing  or bathing? N  Doing errands, shopping? Y  Preparing Food and eating ? N  Using the Toilet? N  In the past six months, have you accidently leaked urine? N  Do you have problems with loss of bowel control? N  Managing your Medications? N  Managing your Finances? N  Housekeeping or managing your Housekeeping? N  Some recent data might be hidden    Patient Care Team: Eulas Post, MD as PCP - General (Family Medicine)  Indicate any recent Medical Services you may have received from other than [facility] providers in the past year (date may be approximate).     Assessment:   This is a routine wellness examination for [facility].  Hearing/Vision screen  Hearing Screening   125Hz  250Hz  500Hz  1000Hz  2000Hz  3000Hz  4000Hz  6000Hz  8000Hz   Right ear:           Left ear:           Vision Screening Comments: Patient states gets eyes checked at least 3 times per year   Dietary issues and exercise activities discussed: Current Exercise Habits: The patient does not participate in regular exercise at present  Goals    . Exercise 3x per week (30 min per time)      Depression Screen PHQ 2/9 Scores 06/13/2020 03/22/2018 10/05/2017 11/24/2016 12/03/2015 03/06/2015 08/23/2013  PHQ - 2 Score 0 0 1 0 0 0 0  PHQ- 9 Score 0 - - - - - -  Exception Documentation - - - - Patient refusal - -    Fall Risk Fall Risk  06/13/2020 03/22/2018 11/24/2016 12/03/2015 03/06/2015  Falls in the past year? 0 No No No No  Number falls in past yr: 0 - - - -  Injury with Fall? 0 - - - -  Risk for fall due to : No Fall Risks;Impaired vision - - - -  Follow up Follow up appointment - - - -    Any stairs in or around the home? Yes  If so, are there any without handrails? No  Home free of loose throw rugs in walkways, pet beds, electrical cords, etc? Yes  Adequate lighting in your home to reduce risk of falls? Yes   ASSISTIVE DEVICES UTILIZED TO PREVENT FALLS:  Life alert? No  Use of a cane, walker or w/c? No  Grab bars in  the bathroom? Yes  Shower chair or bench in shower? No  Elevated toilet seat or a handicapped toilet? Yes    Cognitive Function:  Cognition within normal limits based on direct observation. Screening not indicated      Immunizations Immunization History  Administered Date(s) Administered  . Fluad Quad(high Dose 65+) 05/30/2019, 05/07/2020  . Hepatitis A, Adult 09/07/2018, 03/09/2019  . Influenza, High Dose Seasonal PF 05/26/2016, 06/22/2018  . Moderna SARS-COVID-2 Vaccination 08/23/2019, 10/03/2019, 04/02/2020  . Pneumococcal Conjugate-13 03/06/2015  . Pneumococcal Polysaccharide-23 08/19/2011  . Tdap 08/19/2011    TDAP status: Up to date Flu Vaccine status: Up to date Pneumococcal vaccine status: Up to date Covid-19 vaccine status: Completed vaccines  Qualifies for Shingles Vaccine? Yes   Zostavax completed No   Shingrix Completed?: No.    Education has been provided regarding the importance of this vaccine. Patient has been advised to call insurance company to determine out of pocket expense if they have not yet received this vaccine. Advised may also receive vaccine at local pharmacy or Health Dept. Verbalized acceptance and understanding.  Screening Tests Health Maintenance  Topic Date Due  . TETANUS/TDAP  08/18/2021  . INFLUENZA VACCINE  Completed  . COVID-19 Vaccine  Completed  . PNA vac Low Risk Adult  Completed    Health Maintenance  There are no preventive care reminders to display for this patient.  Colorectal cancer screening: No longer required.   Lung Cancer Screening: (Low Dose CT Chest recommended if Age 59-80 years, 30 pack-year currently smoking OR have quit w/in 15years.) does not qualify.   Lung Cancer Screening Referral: N/A  Additional Screening:  Hepatitis C Screening: does not qualify;   Vision Screening: Recommended annual ophthalmology exams for early detection of glaucoma and other disorders of the eye. Is the patient up to date with  their annual  eye exam?  Yes  Who is the provider or what is the name of the office in which the patient attends annual eye exams? Dr. Rosana Hoes, Dr. Zigmund Daniel  If pt is not established with a provider, would they like to be referred to a provider to establish care? No .   Dental Screening: Recommended annual dental exams for proper oral hygiene  Community Resource Referral / Chronic Care Management: CRR required this visit?  No   CCM required this visit?  No      Plan:     I have personally reviewed and noted the following in the patient's chart:   . Medical and social history . Use of alcohol, tobacco or illicit drugs  . Current medications and supplements . Functional ability and status . Nutritional status . Physical activity . Advanced directives . List of other physicians . Hospitalizations, surgeries, and ER visits in previous 12 months . Vitals . Screenings to include cognitive, depression, and falls . Referrals and appointments  In addition, I have reviewed and discussed with patient certain preventive protocols, quality metrics, and best practice recommendations. A written personalized care plan for preventive services as well as general preventive health recommendations were provided to patient.     Ofilia Neas, LPN   [id number]/09/[id number]   Nurse Notes: None

## 2020-06-13 NOTE — Patient Instructions (Addendum)
Ethan Anderson , Thank you for taking time to come for your Medicare Wellness Visit. I appreciate your ongoing commitment to your health goals. Please review the following plan we discussed and let me know if I can assist you in the future.   Screening recommendations/referrals: Colonoscopy: No longer required  Recommended yearly ophthalmology/optometry visit for glaucoma screening and checkup Recommended yearly dental visit for hygiene and checkup  Vaccinations: Influenza vaccine: Up to date, next due September 2022 Pneumococcal vaccine: Completed series Tdap vaccine: Up to date, next due 08/18/2021 Shingles vaccine: Currently due for shingrix, please contact your insurance company to discuss cost     Advanced directives: Advance directive discussed with you today. Even though you declined this today please call our office should you change your mind and we can give you the proper paperwork for you to fill out.   Conditions/risks identified: Please try to incorporate at least 30 minutes of exercise 3 days a week into your routine   Next appointment: 09/11/2020 @ 10:00 am with Dr. Elease Hashimoto  Preventive Care 83 Years and Older, Male Preventive care refers to lifestyle choices and visits with your health care provider that can promote health and wellness. What does preventive care include?  A yearly physical exam. This is also called an annual well check.  Dental exams once or twice a year.  Routine eye exams. Ask your health care provider how often you should have your eyes checked.  Personal lifestyle choices, including:  Daily care of your teeth and gums.  Regular physical activity.  Eating a healthy diet.  Avoiding tobacco and drug use.  Limiting alcohol use.  Practicing safe sex.  Taking low doses of aspirin every day.  Taking vitamin and mineral supplements as recommended by your health care provider. What happens during an annual well check? The services and  screenings done by your health care provider during your annual well check will depend on your age, overall health, lifestyle risk factors, and family history of disease. Counseling  Your health care provider may ask you questions about your:  Alcohol use.  Tobacco use.  Drug use.  Emotional well-being.  Home and relationship well-being.  Sexual activity.  Eating habits.  History of falls.  Memory and ability to understand (cognition).  Work and work Statistician. Screening  You may have the following tests or measurements:  Height, weight, and BMI.  Blood pressure.  Lipid and cholesterol levels. These may be checked every 5 years, or more frequently if you are over 83 years old.  Skin check.  Lung cancer screening. You may have this screening every year starting at age 83 if you have a 30-pack-year history of smoking and currently smoke or have quit within the past 15 years.  Fecal occult blood test (FOBT) of the stool. You may have this test every year starting at age 40.  Flexible sigmoidoscopy or colonoscopy. You may have a sigmoidoscopy every 5 years or a colonoscopy every 10 years starting at age 83.  Prostate cancer screening. Recommendations will vary depending on your family history and other risks.  Hepatitis C blood test.  Hepatitis B blood test.  Sexually transmitted disease (STD) testing.  Diabetes screening. This is done by checking your blood sugar (glucose) after you have not eaten for a while (fasting). You may have this done every 1-3 years.  Abdominal aortic aneurysm (AAA) screening. You may need this if you are a current or former smoker.  Osteoporosis. You may be screened starting at  age 83 if you are at high risk. Talk with your health care provider about your test results, treatment options, and if necessary, the need for more tests. Vaccines  Your health care provider may recommend certain vaccines, such as:  Influenza vaccine. This is  recommended every year.  Tetanus, diphtheria, and acellular pertussis (Tdap, Td) vaccine. You may need a Td booster every 10 years.  Zoster vaccine. You may need this after age 83.  Pneumococcal 13-valent conjugate (PCV13) vaccine. One dose is recommended after age 83.  Pneumococcal polysaccharide (PPSV23) vaccine. One dose is recommended after age 83. Talk to your health care provider about which screenings and vaccines you need and how often you need them. This information is not intended to replace advice given to you by your health care provider. Make sure you discuss any questions you have with your health care provider. Document Released: 08/24/2015 Document Revised: 04/16/2016 Document Reviewed: 05/29/2015 Elsevier Interactive Patient Education  2017 [employer] Prevention in the Home Falls can cause injuries. They can happen to people of all ages. There are many things you can do to make your home safe and to help prevent falls. What can I do on the outside of my home?  Regularly fix the edges of walkways and driveways and fix any cracks.  Remove anything that might make you trip as you walk through a door, such as a raised step or threshold.  Trim any bushes or trees on the path to your home.  Use bright outdoor lighting.  Clear any walking paths of anything that might make someone trip, such as rocks or tools.  Regularly check to see if handrails are loose or broken. Make sure that both sides of any steps have handrails.  Any raised decks and porches should have guardrails on the edges.  Have any leaves, snow, or ice cleared regularly.  Use sand or salt on walking paths during winter.  Clean up any spills in your garage right away. This includes oil or grease spills. What can I do in the bathroom?  Use night lights.  Install grab bars by the toilet and in the tub and shower. Do not use towel bars as grab bars.  Use non-skid mats or decals in the tub or  shower.  If you need to sit down in the shower, use a plastic, non-slip stool.  Keep the floor dry. Clean up any water that spills on the floor as soon as it happens.  Remove soap buildup in the tub or shower regularly.  Attach bath mats securely with double-sided non-slip rug tape.  Do not have throw rugs and other things on the floor that can make you trip. What can I do in the bedroom?  Use night lights.  Make sure that you have a light by your bed that is easy to reach.  Do not use any sheets or blankets that are too big for your bed. They should not hang down onto the floor.  Have a firm chair that has side arms. You can use this for support while you get dressed.  Do not have throw rugs and other things on the floor that can make you trip. What can I do in the kitchen?  Clean up any spills right away.  Avoid walking on wet floors.  Keep items that you use a lot in easy-to-reach places.  If you need to reach something above you, use a strong step stool that has a grab bar.  Keep electrical cords out of the way.  Do not use floor polish or wax that makes floors slippery. If you must use wax, use non-skid floor wax.  Do not have throw rugs and other things on the floor that can make you trip. What can I do with my stairs?  Do not leave any items on the stairs.  Make sure that there are handrails on both sides of the stairs and use them. Fix handrails that are broken or loose. Make sure that handrails are as long as the stairways.  Check any carpeting to make sure that it is firmly attached to the stairs. Fix any carpet that is loose or worn.  Avoid having throw rugs at the top or bottom of the stairs. If you do have throw rugs, attach them to the floor with carpet tape.  Make sure that you have a light switch at the top of the stairs and the bottom of the stairs. If you do not have them, ask someone to add them for you. What else can I do to help prevent  falls?  Wear shoes that:  Do not have high heels.  Have rubber bottoms.  Are comfortable and fit you well.  Are closed at the toe. Do not wear sandals.  If you use a stepladder:  Make sure that it is fully opened. Do not climb a closed stepladder.  Make sure that both sides of the stepladder are locked into place.  Ask someone to hold it for you, if possible.  Clearly mark and make sure that you can see:  Any grab bars or handrails.  First and last steps.  Where the edge of each step is.  Use tools that help you move around (mobility aids) if they are needed. These include:  Canes.  Walkers.  Scooters.  Crutches.  Turn on the lights when you go into a dark area. Replace any light bulbs as soon as they burn out.  Set up your furniture so you have a clear path. Avoid moving your furniture around.  If any of your floors are uneven, fix them.  If there are any pets around you, be aware of where they are.  Review your medicines with your doctor. Some medicines can make you feel dizzy. This can increase your chance of falling. Ask your doctor what other things that you can do to help prevent falls. This information is not intended to replace advice given to you by your health care provider. Make sure you discuss any questions you have with your health care provider. Document Released: 05/24/2009 Document Revised: 01/03/2016 Document Reviewed: 09/01/2014 Elsevier Interactive Patient Education  2017 [employer].

## 2020-06-27 ENCOUNTER — Other Ambulatory Visit: Payer: Self-pay | Admitting: Nurse Practitioner

## 2020-06-27 DIAGNOSIS — K7469 Other cirrhosis of liver: Secondary | ICD-10-CM

## 2020-07-13 ENCOUNTER — Ambulatory Visit
Admission: RE | Admit: 2020-07-13 | Discharge: 2020-07-13 | Disposition: A | Discharge status: Home / Self Care | Payer: Medicare Other | Source: Ambulatory Visit | Attending: Nurse Practitioner | Admitting: Nurse Practitioner

## 2020-07-13 DIAGNOSIS — K7469 Other cirrhosis of liver: Secondary | ICD-10-CM

## 2020-09-10 ENCOUNTER — Other Ambulatory Visit: Payer: Self-pay

## 2020-09-11 ENCOUNTER — Ambulatory Visit (INDEPENDENT_AMBULATORY_CARE_PROVIDER_SITE_OTHER): Payer: Medicare Other | Admitting: Family Medicine

## 2020-09-11 ENCOUNTER — Encounter: Payer: Self-pay | Admitting: Family Medicine

## 2020-09-11 VITALS — BP 138/68 | HR 90 | Ht 68.0 in | Wt 204.0 lb

## 2020-09-11 DIAGNOSIS — I10 Essential (primary) hypertension: Secondary | ICD-10-CM | POA: Diagnosis not present

## 2020-09-11 DIAGNOSIS — K754 Autoimmune hepatitis: Secondary | ICD-10-CM

## 2020-09-11 DIAGNOSIS — Z0000 Encounter for general adult medical examination without abnormal findings: Secondary | ICD-10-CM

## 2020-09-11 DIAGNOSIS — M109 Gout, unspecified: Secondary | ICD-10-CM

## 2020-09-11 NOTE — Patient Instructions (Signed)
Preventive Care 65 Years and Older, Male Preventive care refers to lifestyle choices and visits with your health care provider that can promote health and wellness. This includes:  A yearly physical exam. This is also called an annual wellness visit.  Regular dental and eye exams.  Immunizations.  Screening for certain conditions.  Healthy lifestyle choices, such as: ? Eating a healthy diet. ? Getting regular exercise. ? Not using drugs or products that contain nicotine and tobacco. ? Limiting alcohol use. What can I expect for my preventive care visit? Physical exam Your health care provider will check your:  Height and weight. These may be used to calculate your BMI (body mass index). BMI is a measurement that tells if you are at a healthy weight.  Heart rate and blood pressure.  Body temperature.  Skin for abnormal spots. Counseling Your health care provider may ask you questions about your:  Past medical problems.  Family's medical history.  Alcohol, tobacco, and drug use.  Emotional well-being.  Home life and relationship well-being.  Sexual activity.  Diet, exercise, and sleep habits.  History of falls.  Memory and ability to understand (cognition).  Work and work environment.  Access to firearms. What immunizations do I need? Vaccines are usually given at various ages, according to a schedule. Your health care provider will recommend vaccines for you based on your age, medical history, and lifestyle or other factors, such as travel or where you work.   What tests do I need? Blood tests  Lipid and cholesterol levels. These may be checked every 5 years, or more often depending on your overall health.  Hepatitis C test.  Hepatitis B test. Screening  Lung cancer screening. You may have this screening every year starting at age 55 if you have a 30-pack-year history of smoking and currently smoke or have quit within the past 15 years.  Colorectal  cancer screening. ? All adults should have this screening starting at age 50 and continuing until age 75. ? Your health care provider may recommend screening at age 45 if you are at increased risk. ? You will have tests every 1-10 years, depending on your results and the type of screening test.  Prostate cancer screening. Recommendations will vary depending on your family history and other risks.  Genital exam to check for testicular cancer or hernias.  Diabetes screening. ? This is done by checking your blood sugar (glucose) after you have not eaten for a while (fasting). ? You may have this done every 1-3 years.  Abdominal aortic aneurysm (AAA) screening. You may need this if you are a current or former smoker.  STD (sexually transmitted disease) testing, if you are at risk. Follow these instructions at home: Eating and drinking  Eat a diet that includes fresh fruits and vegetables, whole grains, lean protein, and low-fat dairy products. Limit your intake of foods with high amounts of sugar, saturated fats, and salt.  Take vitamin and mineral supplements as recommended by your health care provider.  Do not drink alcohol if your health care provider tells you not to drink.  If you drink alcohol: ? Limit how much you have to 0-2 drinks a day. ? Be aware of how much alcohol is in your drink. In the [place] one drink equals one 12 oz bottle of beer (355 mL), one 5 oz glass of wine (148 mL), or one 1 oz glass of hard liquor (44 mL).   Lifestyle  Take daily care of your teeth   and gums. Brush your teeth every morning and night with fluoride toothpaste. Floss one time each day.  Stay active. Exercise for at least 30 minutes 5 or more days each week.  Do not use any products that contain nicotine or tobacco, such as cigarettes, e-cigarettes, and chewing tobacco. If you need help quitting, ask your health care provider.  Do not use drugs.  If you are sexually active, practice safe sex.  Use a condom or other form of protection to prevent STIs (sexually transmitted infections).  Talk with your health care provider about taking a low-dose aspirin or statin.  Find healthy ways to cope with stress, such as: ? Meditation, yoga, or listening to music. ? Journaling. ? Talking to a trusted person. ? Spending time with friends and family. Safety  Always wear your seat belt while driving or riding in a vehicle.  Do not drive: ? If you have been drinking alcohol. Do not ride with someone who has been drinking. ? When you are tired or distracted. ? While texting.  Wear a helmet and other protective equipment during sports activities.  If you have firearms in your house, make sure you follow all gun safety procedures. What's next?  Visit your health care provider once a year for an annual wellness visit.  Ask your health care provider how often you should have your eyes and teeth checked.  Stay up to date on all vaccines. This information is not intended to replace advice given to you by your health care provider. Make sure you discuss any questions you have with your health care provider. Document Revised: 04/26/2019 Document Reviewed: 07/22/2018 Elsevier Patient Education  2021 Elsevier Inc.  

## 2020-09-11 NOTE — Progress Notes (Signed)
Established Patient Office Visit  Subjective:  Patient ID: Ethan Anderson, male    DOB: 1935/05/16  Age: 86 y.o. MRN: [id number]  CC:  Chief Complaint  Patient presents with  . Annual Exam    HPI Ethan Anderson presents for annual physical exam.  Generally doing well.  Has no specific complaints today.  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  Health maintenance reviewed  -Influenza vaccine already given -Covid vaccines up-to-date -Pneumonia vaccines complete -Tetanus due 2023 -No record of Shingrix but he thinks he got this may be at local pharmacy.  Social history-married.  He has 6 children and 4 or 5 grandchildren.  He cannot recall.  He also has couple of great-grandchildren.  Quit smoking 1992.  No alcohol currently.  Only rare alcohol use in the past.  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  Family history-he thinks his mother had ovarian cancer and possibly gastric cancer as well.  His father had dementia.  No family history of premature heart disease.  1 son with history of thyroid cancer  Past Medical History:  Diagnosis Date  . Arthritis   . Cataract    ight eye and has been removed  . Colon polyps   . Depression   . Glaucoma   . HTN (hypertension)   . Macular degeneration     Past Surgical History:  Procedure Laterality Date  . CATARACT EXTRACTION Right   . COLONOSCOPY    . MULTIPLE TOOTH EXTRACTIONS     pt weard upper & lower dentures  . POLYPECTOMY    . UPPER GI ENDOSCOPY  09/21/2018    Family History  Problem Relation Age of Onset  . Ovarian cancer Mother   . Stomach cancer Mother   . Dementia Father   . Alcohol abuse Cousin   . Thyroid cancer Son   . Other Grandchild        Primary Sclerosing Cholangitis  . Colon cancer Neg Hx   . Esophageal cancer Neg Hx   . Rectal cancer Neg Hx   . Pancreatic cancer Neg Hx   . Prostate cancer Neg Hx      Social History   Socioeconomic History  . Marital status: Married    Spouse name: Doris  . Number of children: 6  . Years of education: Not on file  . Highest education level: Not on file  Occupational History  . Occupation: retired  Tobacco Use  . Smoking status: Former Smoker    Packs/day: 1.00    Years: 10.00    Pack years: 10.00    Types: Cigarettes    Quit date: 08/18/1990    Years since quitting: 30.0  . Smokeless tobacco: Never Used  Vaping Use  . Vaping Use: Never used  Substance and Sexual Activity  . Alcohol use: No  . Drug use: No  . Sexual activity: Not on file  Other Topics Concern  . Not on file  Social History Narrative  . Not on file   Social Determinants of Health   Financial Resource Strain: Low Risk   . Difficulty of Paying Living Expenses: Not hard at all  Food Insecurity: No Food Insecurity  . Worried About Charity fundraiser in the Last Year: Never true  . Ran Out of Food in the Last Year: Never true  Transportation Needs: No Transportation Needs  . Lack of Transportation (Medical): No  .  Lack of Transportation (Non-Medical): No  Physical Activity: Inactive  . Days of Exercise per Week: 0 days  . Minutes of Exercise per Session: 0 min  Stress: No Stress Concern Present  . Feeling of Stress : Not at all  Social Connections: Moderately Integrated  . Frequency of Communication with Friends and Family: More than three times a week  . Frequency of Social Gatherings with Friends and Family: More than three times a week  . Attends Religious Services: 1 to 4 times per year  . Active Member of Clubs or Organizations: No  . Attends Archivist Meetings: Never  . Marital Status: Married  Human resources officer Violence: Not At Risk  . Fear of Current or Ex-Partner: No  . Emotionally Abused: No  . Physically Abused: No  . Sexually Abused: No    Outpatient Medications Prior to Visit  Medication Sig Dispense Refill  . amLODipine (NORVASC) 5  MG tablet TAKE ONE (1) TABLET EACH DAY 90 tablet 2  . azaTHIOprine (IMURAN) 50 MG tablet Take 100 mg by mouth daily.     . furosemide (LASIX) 40 MG tablet TAKE 1 AND 1/2 TABLETS DAILY (Patient taking differently: Take 40 mg by mouth daily. TAKE 1 TABLET BY MOUTH ONCE DAILY) 135 tablet 1  . ketorolac (ACULAR) 0.5 % ophthalmic solution Place 1 drop into the right eye 2 (two) times daily.     Marland Kitchen latanoprost (XALATAN) 0.005 % ophthalmic solution Place 1 drop into the right eye at bedtime.    Marland Kitchen LUMIGAN 0.01 % SOLN Place 1 drop into the right eye at bedtime.    . Multiple Vitamins-Minerals (EYE VITAMINS PO) Take 2 capsules by mouth daily.     . potassium chloride (KLOR-CON) 10 MEQ tablet TAKE ONE (1) TABLET EACH DAY 90 tablet 3   No facility-administered medications prior to visit.    No Known Allergies  ROS Review of Systems  Constitutional: Negative for fatigue and unexpected weight change.  Eyes: Negative for visual disturbance.  Respiratory: Negative for cough, chest tightness and shortness of breath.   Cardiovascular: Negative for chest pain, palpitations and leg swelling.  Gastrointestinal: Negative for abdominal pain.  Endocrine: Negative for polydipsia and polyuria.  Genitourinary: Negative for dysuria.  Musculoskeletal: Negative for back pain.  Neurological: Negative for dizziness, syncope, weakness, light-headedness and headaches.      Objective:    Physical Exam Vitals reviewed.  Constitutional:      Appearance: Normal appearance.  Cardiovascular:     Rate and Rhythm: Normal rate and regular rhythm.  Pulmonary:     Effort: Pulmonary effort is normal.     Breath sounds: Normal breath sounds.  Abdominal:     Palpations: Abdomen is soft.     Tenderness: There is no abdominal tenderness. There is no guarding.     Comments: He has some protuberance consistent with likely diastases recti.  This is soft and nontender.  This is midline.  Musculoskeletal:     Cervical back:  Neck supple.     Comments: Only trace nonpitting edema ankles bilaterally  Lymphadenopathy:     Cervical: No cervical adenopathy.  Neurological:     General: No focal deficit present.     Mental Status: He is alert.     Cranial Nerves: No cranial nerve deficit.  Psychiatric:        Mood and Affect: Mood normal.     BP 138/68   Pulse 90   Ht 5\' 8"  (1.727 m)  Wt 204 lb (92.5 kg)   SpO2 95%   BMI 31.02 kg/m  Wt Readings from Last 3 Encounters:  09/11/20 204 lb (92.5 kg)  05/07/20 197 lb 12.8 oz (89.7 kg)  09/06/19 203 lb 11.2 oz (92.4 kg)     There are no preventive care reminders to display for this patient.  There are no preventive care reminders to display for this patient.  Lab Results  Component Value Date   TSH 5.52 (H) 02/08/2018   Lab Results  Component Value Date   WBC 5.2 05/29/2019   HGB 11.4 (L) 05/29/2019   HCT 33.1 (L) 05/29/2019   MCV 92.5 05/29/2019   PLT 144 (L) 05/29/2019   Lab Results  Component Value Date   NA 138 05/07/2020   K 3.7 05/07/2020   CO2 30 05/07/2020   GLUCOSE 94 05/07/2020   BUN 14 05/07/2020   CREATININE 1.02 05/07/2020   BILITOT 1.0 05/30/2019   ALKPHOS 59 05/30/2019   AST 46 (H) 05/30/2019   ALT 29 05/30/2019   PROT 5.6 (L) 05/30/2019   ALBUMIN 1.9 (L) 05/30/2019   CALCIUM 9.0 05/07/2020   ANIONGAP 5 05/30/2019   GFR 92.96 04/04/2019   Lab Results  Component Value Date   CHOL 215 (H) 11/24/2016   Lab Results  Component Value Date   HDL 51.50 11/24/2016   Lab Results  Component Value Date   LDLCALC 137 (H) 11/24/2016   Lab Results  Component Value Date   TRIG 130.0 11/24/2016   Lab Results  Component Value Date   CHOLHDL 4 11/24/2016   No results found for: HGBA1C    Assessment & Plan:   Physical exam.  He has stable medical problems as above.  We discussed the following health maintenance issues  -Continue annual flu vaccine -We have asked that he confirm Shingrix vaccine at local pharmacy.  If  he is not gotten this would consider doing so -Check labs with CBC and CMP.  We discussed other potential labs such as lipid but he declines at this time.  No orders of the defined types were placed in this encounter.   Follow-up: No follow-ups on file.    Carolann Littler, MD

## 2020-10-08 IMAGING — CR DG CHEST 1V PORT
1 series · 1 of 1 positions shown · non-contrast
Comparison: 10/05/2017

CLINICAL DATA: Fever, weakness

EXAM:
PORTABLE CHEST 1 VIEW

[portable]
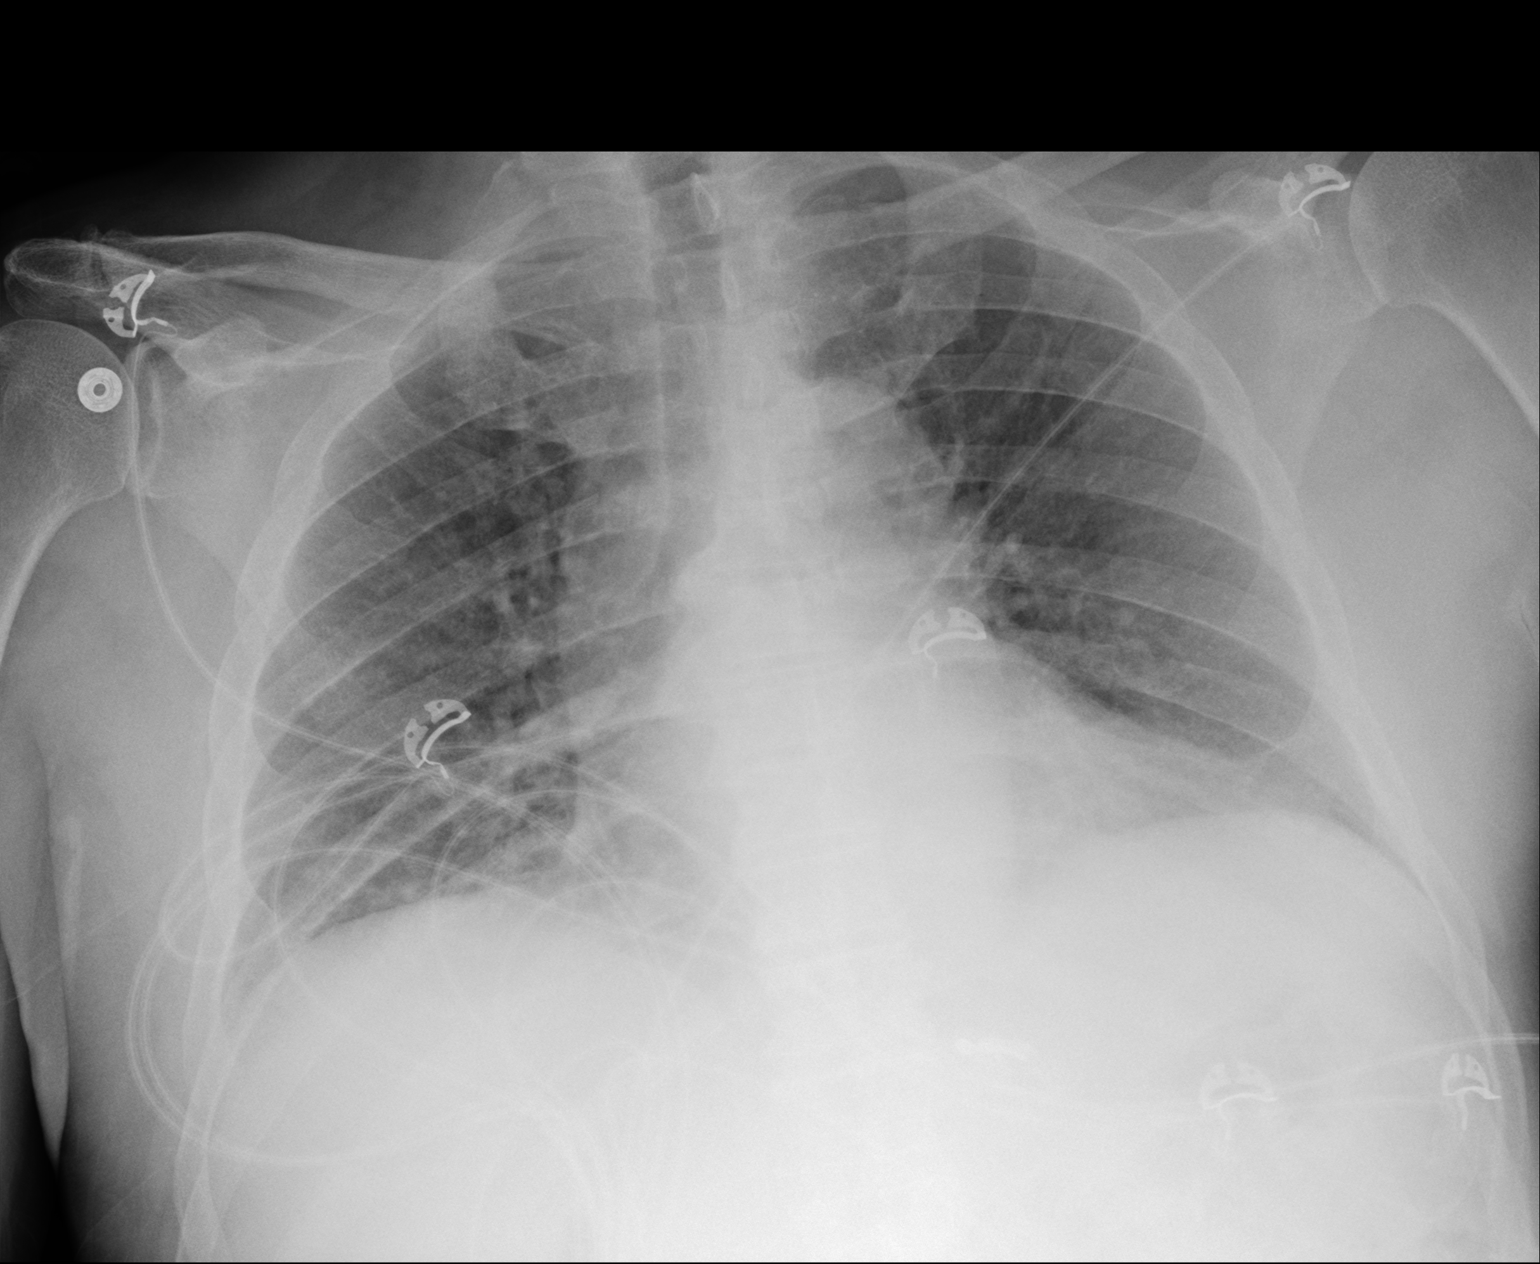

[1 of 1 positions shown; findings below may reference images not displayed]

FINDINGS: The heart size and mediastinal contours are within normal limits.
Mild bilateral perihilar and bibasilar interstitial opacities. No
large pleural fluid collection. No pneumothorax.
IMPRESSION: Mild bilateral perihilar and bibasilar interstitial opacities, right
greater than left, which could represent an atypical and/or viral
pneumonia.

## 2020-10-08 IMAGING — CR DG KNEE COMPLETE 4+V*L*
1 series · 4 of 4 positions shown · non-contrast
Comparison: None.

CLINICAL DATA: Knee stiffness

EXAM:
LEFT KNEE - COMPLETE 4+ VIEW

[Series 1: ap · 0.17mm/px · 4 of 4 slices shown]
[im 1/4]
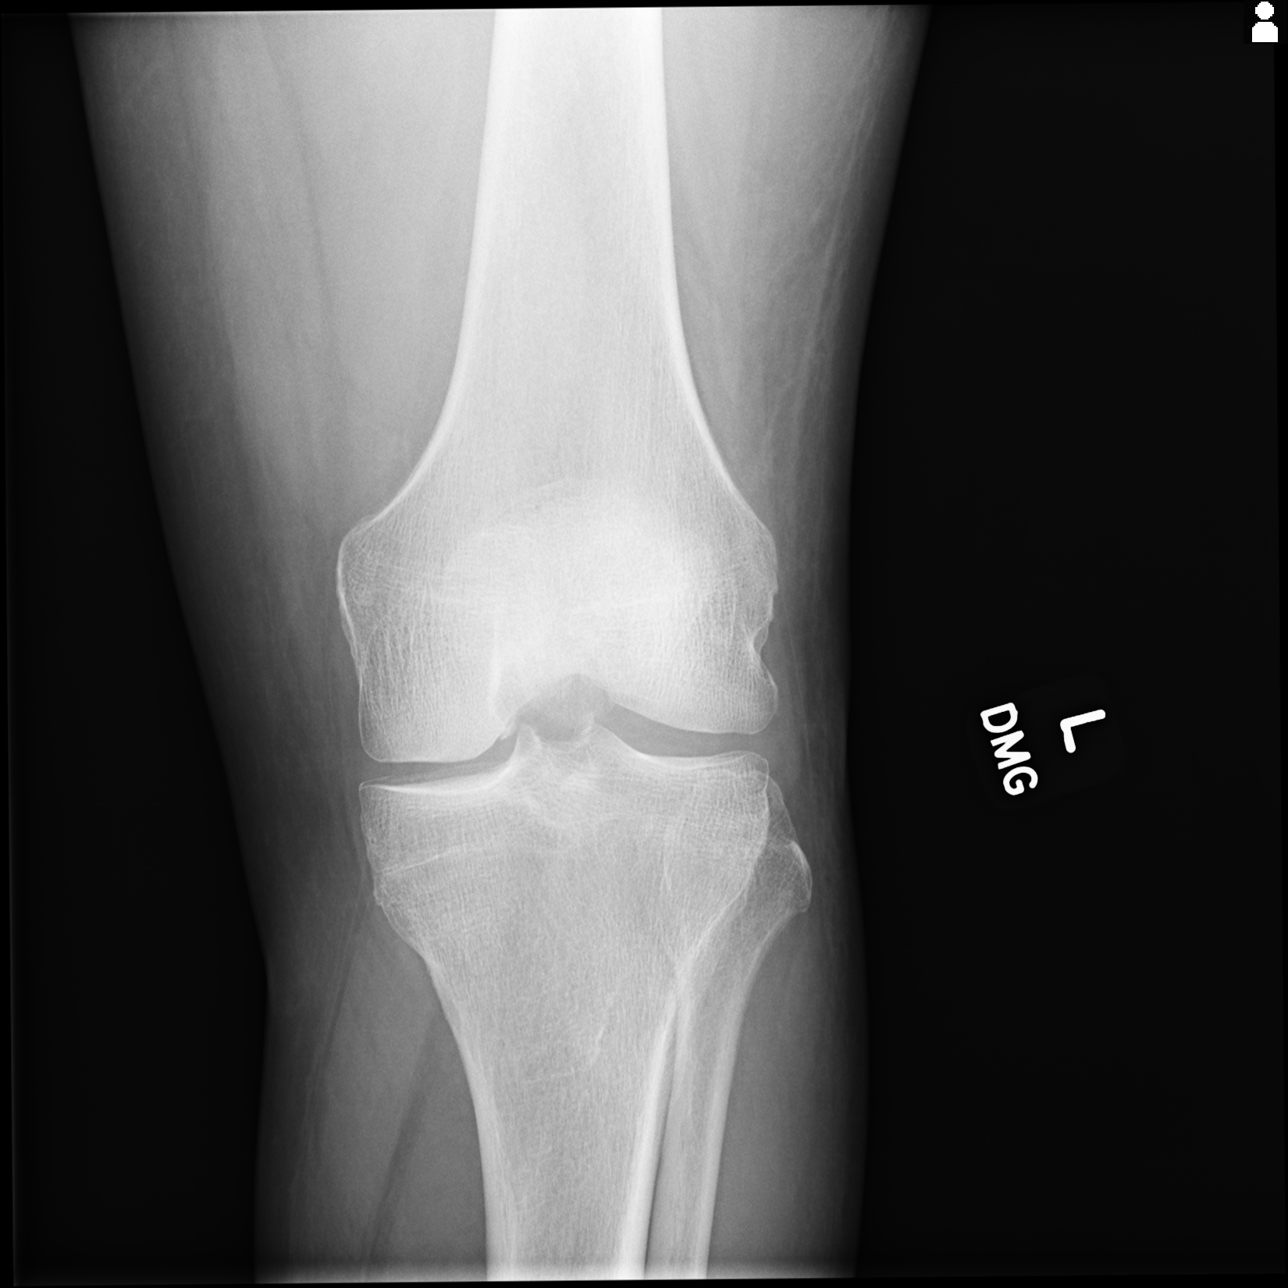
[im 2/4]
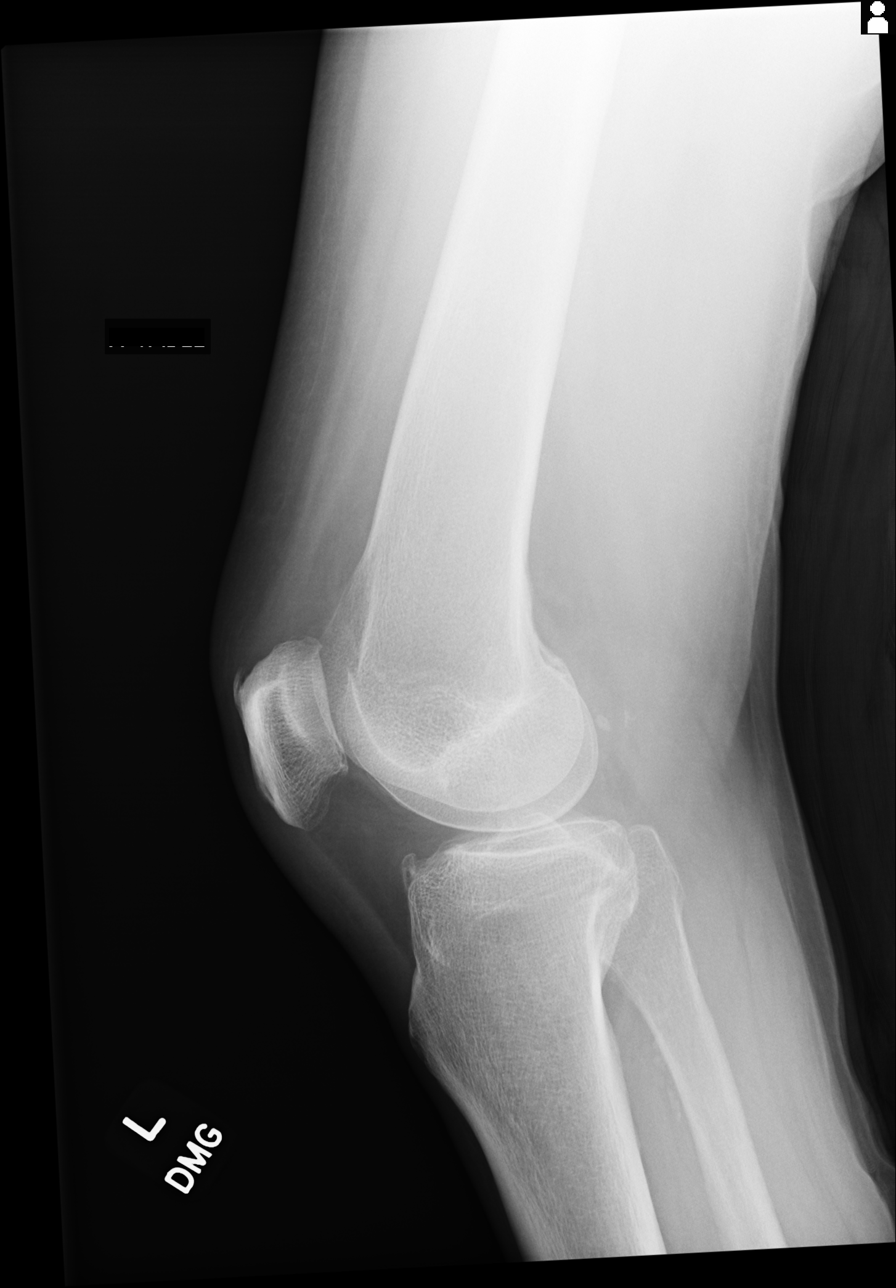
[im 3/4]
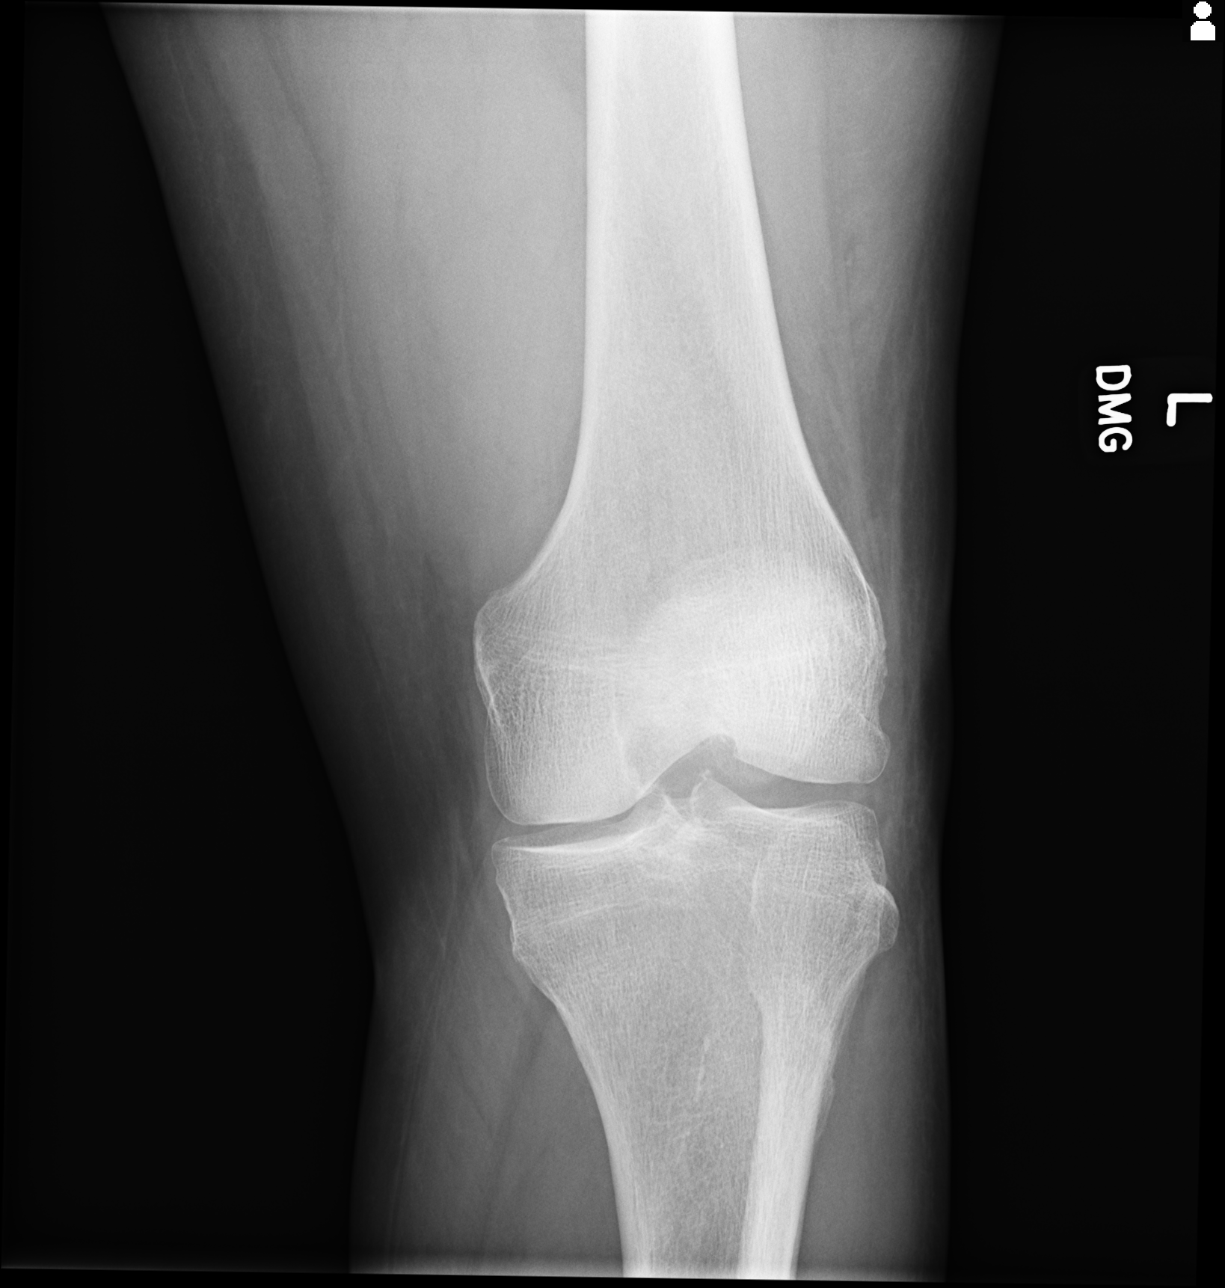
[im 4/4]
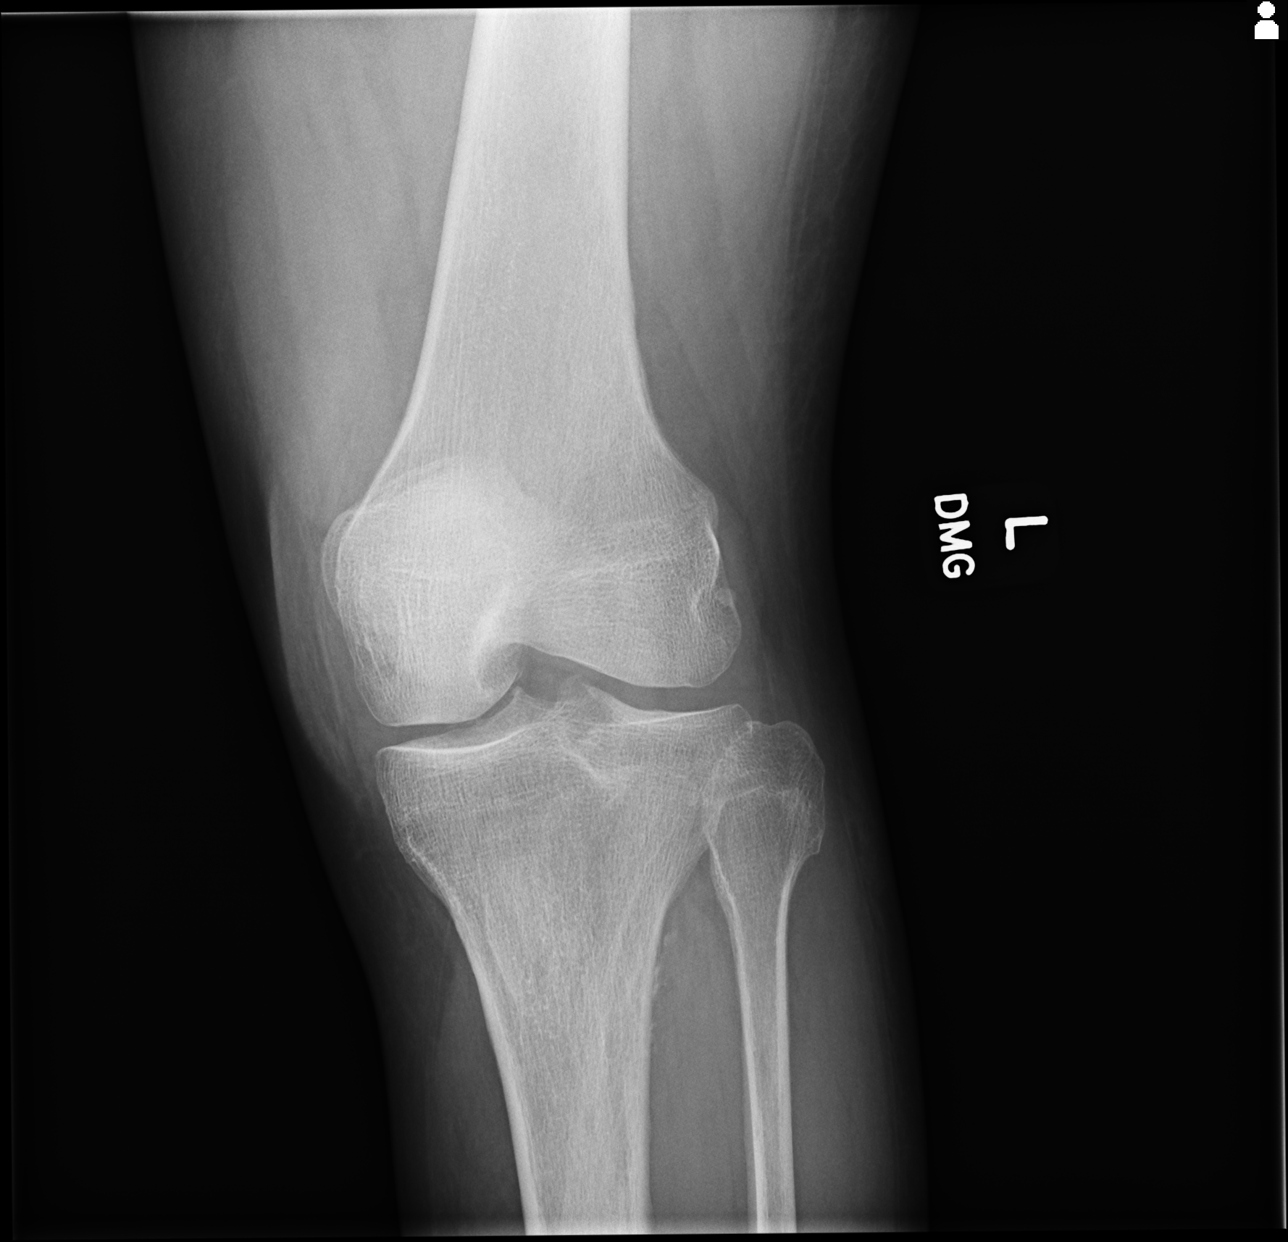

[4 of 4 positions shown; findings below may reference images not displayed]

FINDINGS: No acute fracture or malalignment. Mild tricompartmental
osteoarthritis. Small enthesophyte at the quadriceps tendon
insertion. No knee joint effusion. No acute soft tissue findings.
IMPRESSION: No acute osseous abnormality, left knee.

## 2020-10-09 IMAGING — US US EXTREM LOW VENOUS
1 series · 13 of 24 positions shown · non-contrast
Comparison: None.

CLINICAL DATA: Bilateral lower extremity pain and edema, left
greater than right. Evaluate for DVT.



[Series 1: us extrem low venous · 0.08mm/px · 13 of 90 slices shown]
[im 1/90]
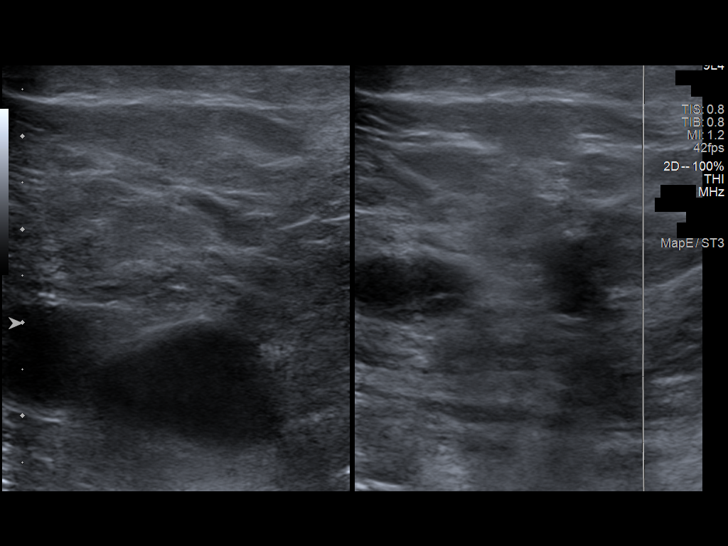
[im 8/90]
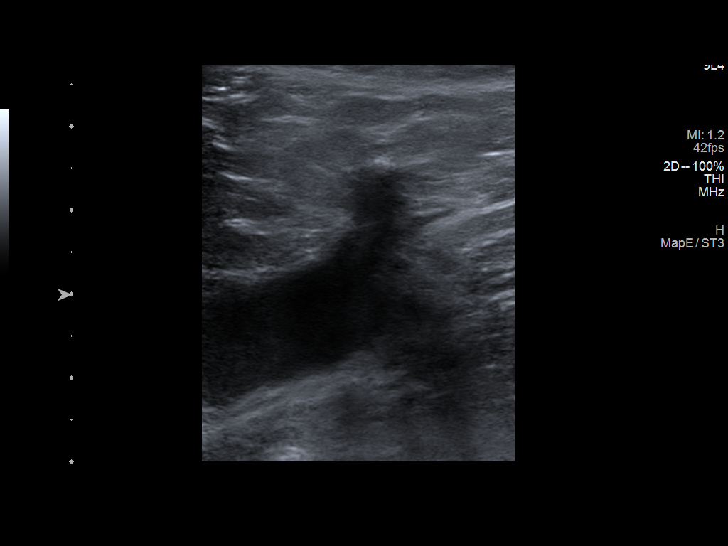
[im 16/90]
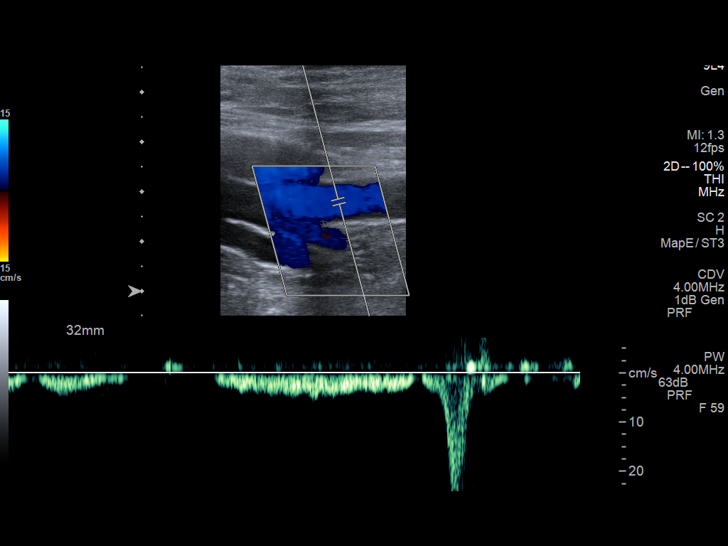
[im 24/90]
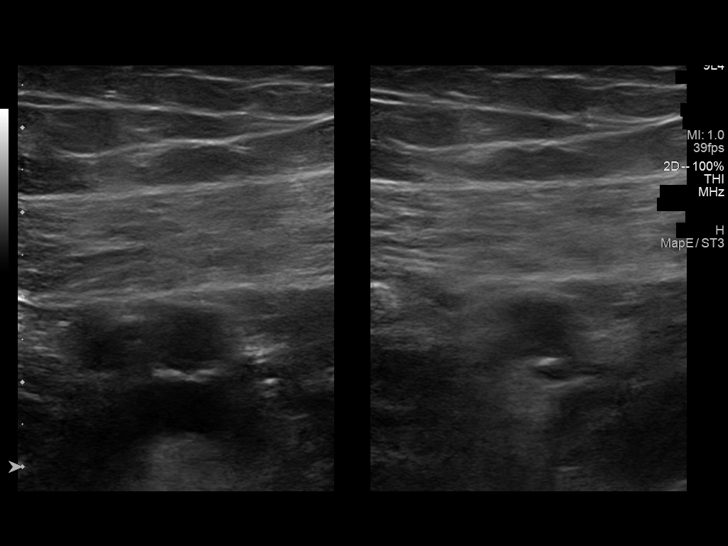
[im 31/90]
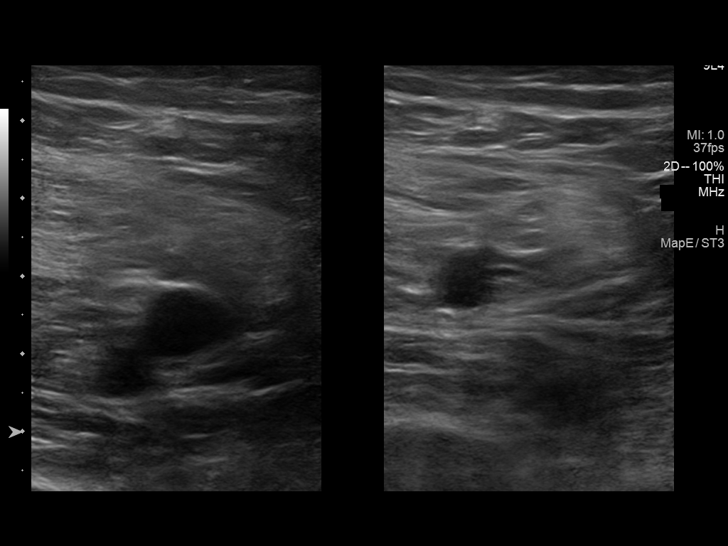
[im 39/90]
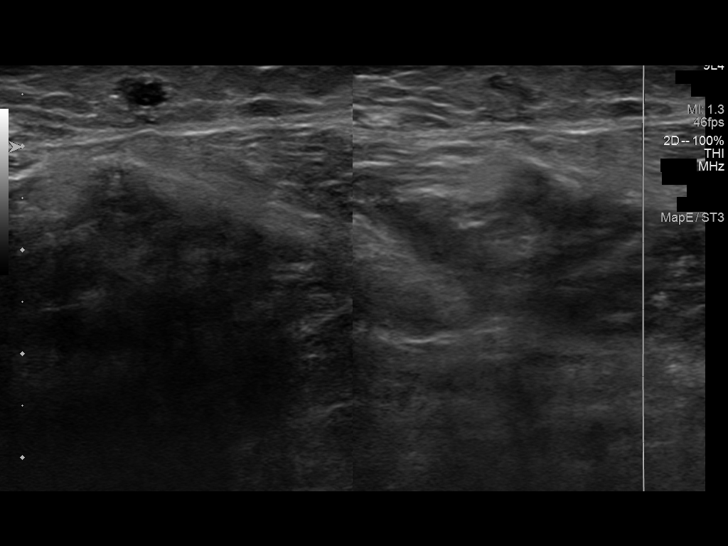
[im 47/90]
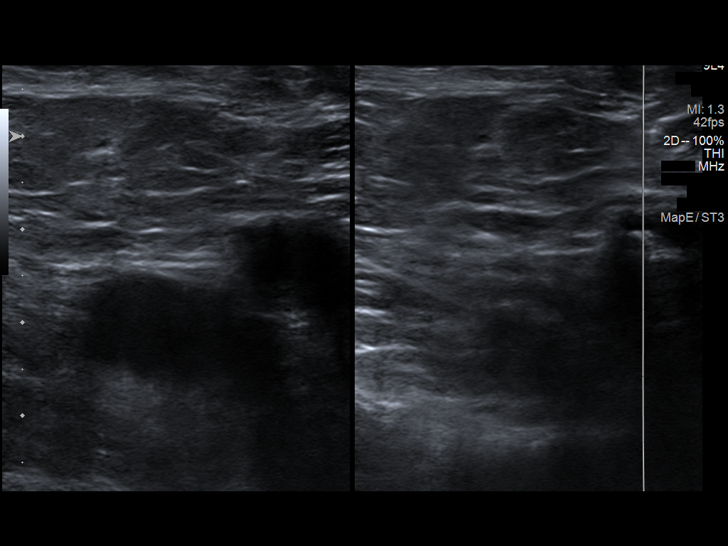
[im 51/90]
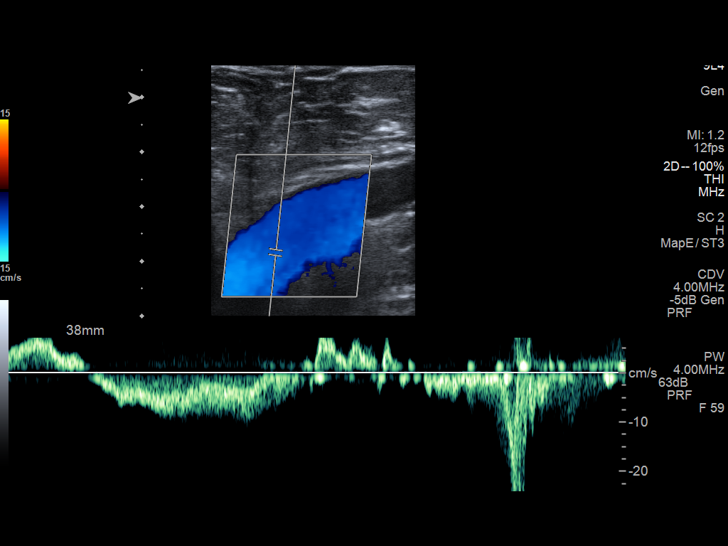
[im 59/90]
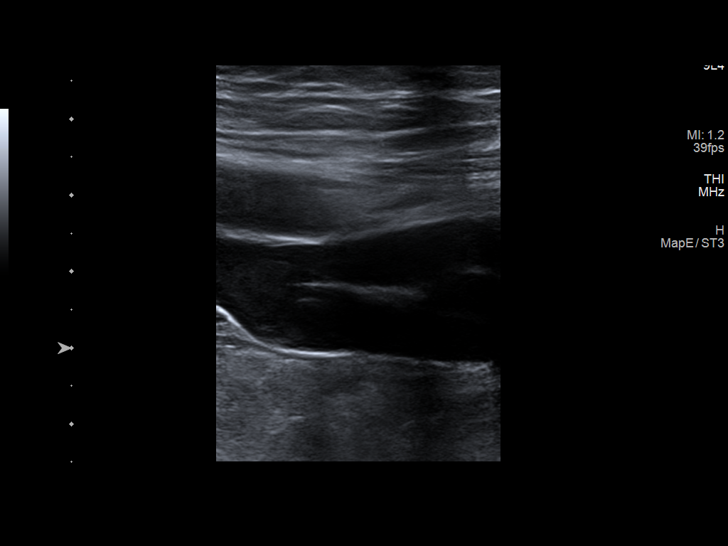
[im 66/90]
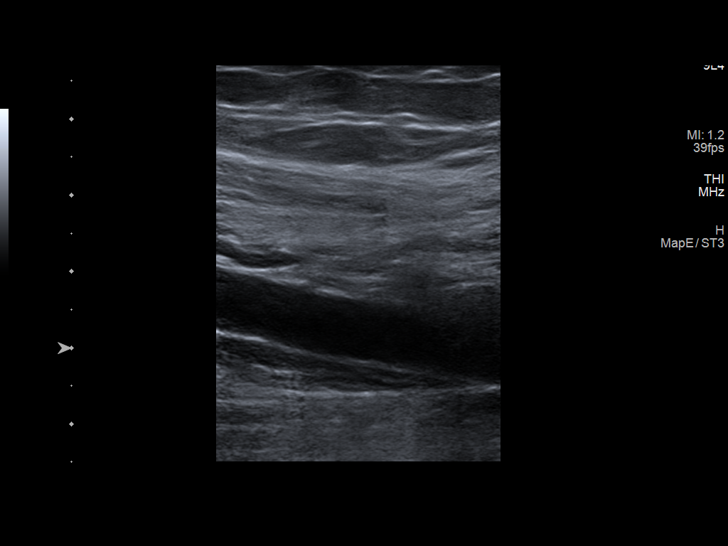
[im 74/90]
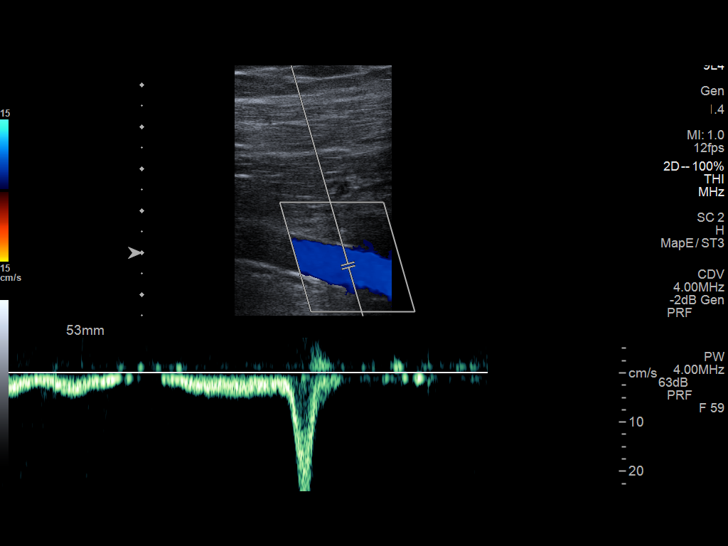
[im 82/90]
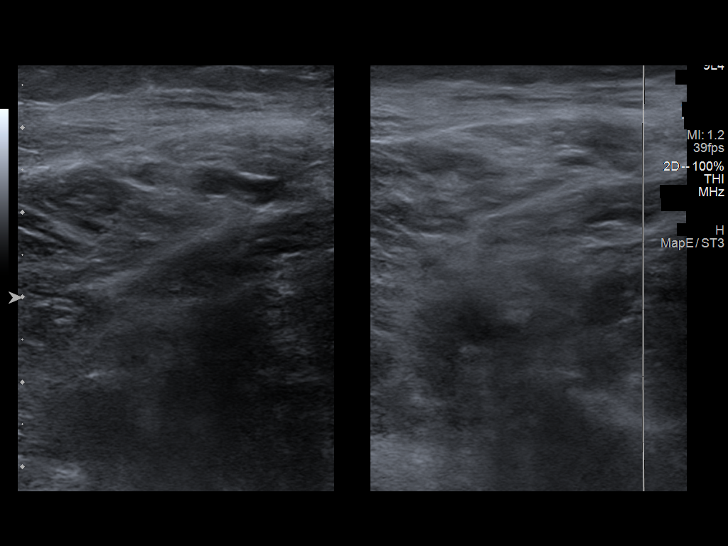
[im 90/90]
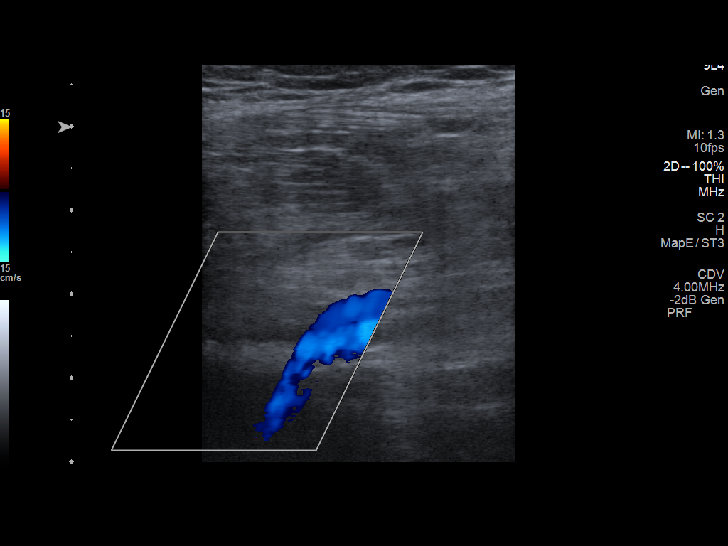

[13 of 24 positions shown; findings below may reference images not displayed]

FINDINGS: RIGHT LOWER EXTREMITY

Common Femoral Vein: No evidence of thrombus. Normal
compressibility, respiratory phasicity and response to augmentation.

Saphenofemoral Junction: No evidence of thrombus. Normal
compressibility and flow on color Doppler imaging.

Profunda Femoral Vein: No evidence of thrombus. Normal
compressibility and flow on color Doppler imaging.

Femoral Vein: No evidence of thrombus. Normal compressibility,
respiratory phasicity and response to augmentation.

Popliteal Vein: No evidence of thrombus. Normal compressibility,
respiratory phasicity and response to augmentation.

Calf Veins: No evidence of thrombus. Normal compressibility and flow
on color Doppler imaging.

Superficial Great Saphenous Vein: No evidence of thrombus. Normal
compressibility.

Venous Reflux:  None.

Other Findings:  None.

LEFT LOWER EXTREMITY

Common Femoral Vein: No evidence of thrombus. Normal
compressibility, respiratory phasicity and response to augmentation.

Saphenofemoral Junction: No evidence of thrombus. Normal
compressibility and flow on color Doppler imaging.

Profunda Femoral Vein: No evidence of thrombus. Normal
compressibility and flow on color Doppler imaging.

Femoral Vein: No evidence of thrombus. Normal compressibility,
respiratory phasicity and response to augmentation.

Popliteal Vein: No evidence of thrombus. Normal compressibility,
respiratory phasicity and response to augmentation.

Calf Veins: No evidence of thrombus. Normal compressibility and flow
on color Doppler imaging.

Superficial Great Saphenous Vein: No evidence of thrombus. Normal
compressibility.

Venous Reflux:  None.

Other Findings:  None.
IMPRESSION: No evidence of DVT within either lower extremity.

## 2020-10-10 IMAGING — CR DG CHEST 1V PORT
2 series · 2 of 2 positions shown · non-contrast
Comparison: 05/26/2019

CLINICAL DATA: Fever and weakness.

EXAM:
PORTABLE CHEST 1 VIEW

[portable (1 of 2)]
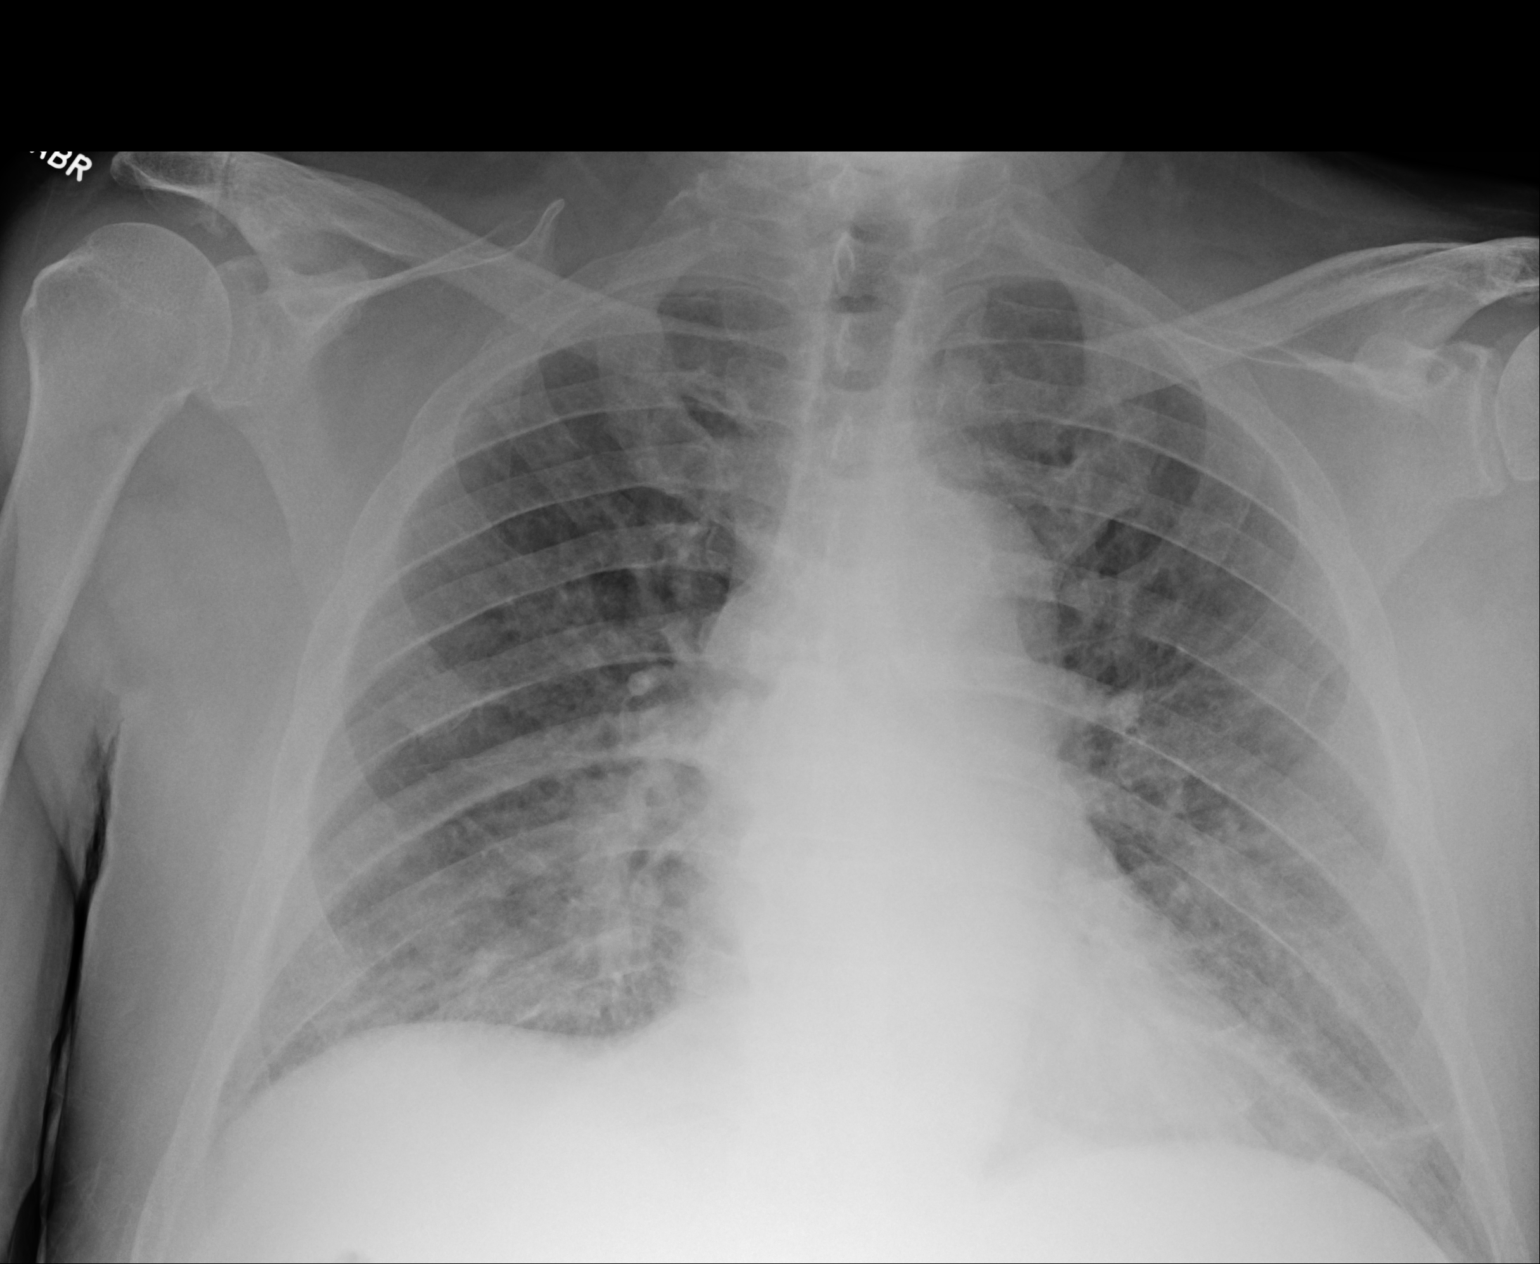

[portable (2 of 2)]
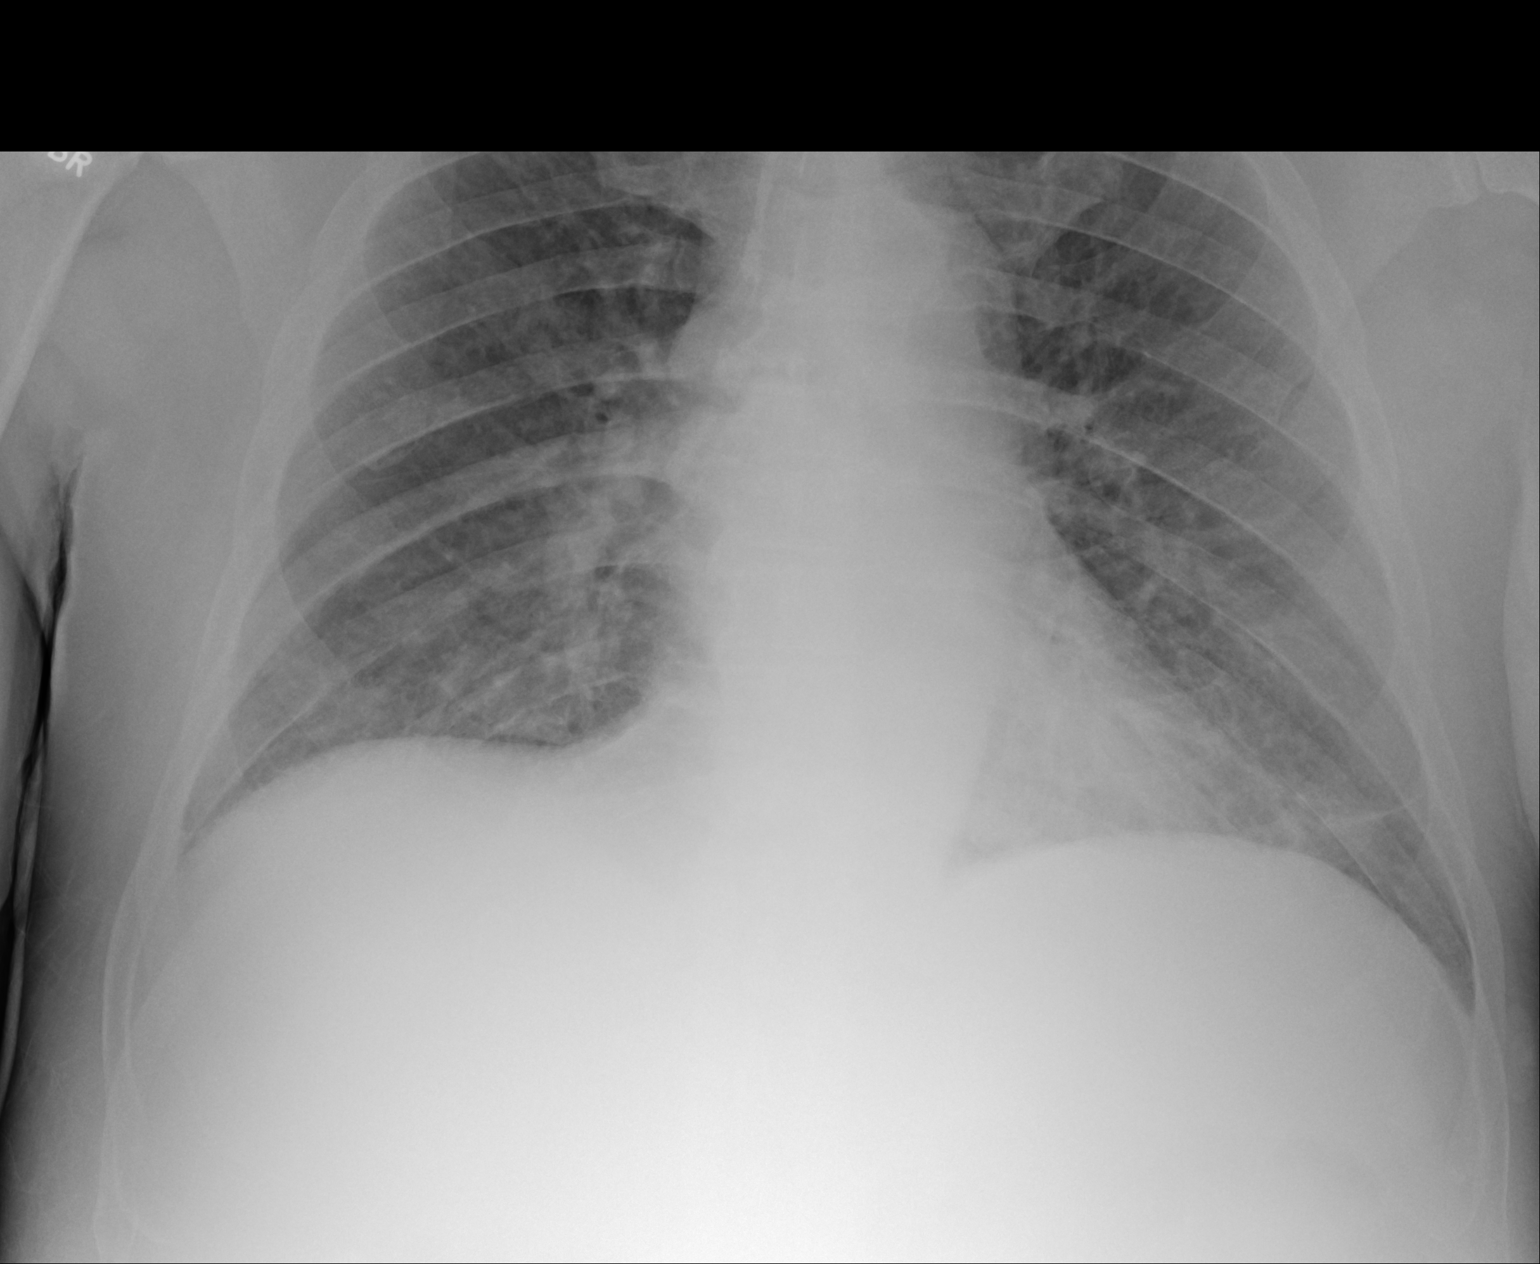

[2 of 2 positions shown; findings below may reference images not displayed]

FINDINGS: Heart size is normal. Increased bilateral pulmonary markings
persists which could represent pneumonia or widespread bronchitis.
No dense consolidation, collapse or effusion.
IMPRESSION: Persistent abnormal pulmonary markings. This could represent
pneumonia or widespread bronchitis. No dense consolidation or
collapse.

## 2020-11-30 ENCOUNTER — Other Ambulatory Visit: Payer: Self-pay | Admitting: Family Medicine

## 2020-12-27 ENCOUNTER — Other Ambulatory Visit: Payer: Self-pay | Admitting: Nurse Practitioner

## 2020-12-27 DIAGNOSIS — K7469 Other cirrhosis of liver: Secondary | ICD-10-CM

## 2021-01-05 ENCOUNTER — Other Ambulatory Visit: Payer: Self-pay | Admitting: Family Medicine

## 2021-01-17 ENCOUNTER — Ambulatory Visit
Admission: RE | Admit: 2021-01-17 | Discharge: 2021-01-17 | Disposition: A | Discharge status: Home / Self Care | Payer: Medicare Other | Source: Ambulatory Visit | Attending: Nurse Practitioner | Admitting: Nurse Practitioner

## 2021-01-17 DIAGNOSIS — K7469 Other cirrhosis of liver: Secondary | ICD-10-CM

## 2021-04-04 ENCOUNTER — Other Ambulatory Visit: Payer: Self-pay

## 2021-04-04 ENCOUNTER — Encounter (INDEPENDENT_AMBULATORY_CARE_PROVIDER_SITE_OTHER): Payer: Medicare Other | Admitting: Ophthalmology

## 2021-04-04 DIAGNOSIS — H35033 Hypertensive retinopathy, bilateral: Secondary | ICD-10-CM

## 2021-04-04 DIAGNOSIS — H353112 Nonexudative age-related macular degeneration, right eye, intermediate dry stage: Secondary | ICD-10-CM | POA: Diagnosis not present

## 2021-04-04 DIAGNOSIS — I10 Essential (primary) hypertension: Secondary | ICD-10-CM

## 2021-04-04 DIAGNOSIS — H348122 Central retinal vein occlusion, left eye, stable: Secondary | ICD-10-CM

## 2021-04-04 DIAGNOSIS — H43813 Vitreous degeneration, bilateral: Secondary | ICD-10-CM

## 2021-04-13 ENCOUNTER — Other Ambulatory Visit: Payer: Self-pay | Admitting: Family Medicine

## 2021-06-14 ENCOUNTER — Ambulatory Visit: Payer: Medicare Other

## 2021-06-14 ENCOUNTER — Ambulatory Visit (INDEPENDENT_AMBULATORY_CARE_PROVIDER_SITE_OTHER): Payer: Medicare Other | Admitting: Family Medicine

## 2021-06-14 ENCOUNTER — Other Ambulatory Visit: Payer: Self-pay

## 2021-06-14 VITALS — BP 140/80 | HR 90 | Temp 98.1°F | Ht 68.0 in | Wt 195.2 lb

## 2021-06-14 DIAGNOSIS — Z0000 Encounter for general adult medical examination without abnormal findings: Secondary | ICD-10-CM

## 2021-06-14 LAB — CBC WITH DIFFERENTIAL/PLATELET
Basophils Absolute: 0 10*3/uL (ref 0.0–0.1)
Basophils Relative: 1 % (ref 0.0–3.0)
Eosinophils Absolute: 0.2 10*3/uL (ref 0.0–0.7)
Eosinophils Relative: 4.8 % (ref 0.0–5.0)
HCT: 32.9 % — ABNORMAL LOW (ref 39.0–52.0)
Hemoglobin: 11.6 g/dL — ABNORMAL LOW (ref 13.0–17.0)
Lymphocytes Relative: 36.2 % (ref 12.0–46.0)
Lymphs Abs: 1.7 10*3/uL (ref 0.7–4.0)
MCHC: 35.3 g/dL (ref 30.0–36.0)
MCV: 104 fl — ABNORMAL HIGH (ref 78.0–100.0)
Monocytes Absolute: 0.5 10*3/uL (ref 0.1–1.0)
Monocytes Relative: 10.7 % (ref 3.0–12.0)
Neutro Abs: 2.2 10*3/uL (ref 1.4–7.7)
Neutrophils Relative %: 47.3 % (ref 43.0–77.0)
Platelets: 138 10*3/uL — ABNORMAL LOW (ref 150.0–400.0)
RBC: 3.16 Mil/uL — ABNORMAL LOW (ref 4.22–5.81)
RDW: 18.3 % — ABNORMAL HIGH (ref 11.5–15.5)
WBC: 4.7 10*3/uL (ref 4.0–10.5)

## 2021-06-14 LAB — LIPID PANEL
Cholesterol: 173 mg/dL (ref 0–200)
HDL: 60.8 mg/dL (ref >39.00)
LDL Cholesterol: 90 mg/dL (ref 0–99)
NonHDL: 111.86
Total CHOL/HDL Ratio: 3
Triglycerides: 108 mg/dL (ref 0.0–149.0)
VLDL: 21.6 mg/dL (ref 0.0–40.0)

## 2021-06-14 LAB — HEPATIC FUNCTION PANEL
ALT: 15 U/L (ref 0–53)
AST: 29 U/L (ref 0–37)
Albumin: 3.8 g/dL (ref 3.5–5.2)
Alkaline Phosphatase: 105 U/L (ref 39–117)
Bilirubin, Direct: 0.4 mg/dL — ABNORMAL HIGH (ref 0.0–0.3)
Total Bilirubin: 1.8 mg/dL — ABNORMAL HIGH (ref 0.2–1.2)
Total Protein: 7.4 g/dL (ref 6.0–8.3)

## 2021-06-14 LAB — BASIC METABOLIC PANEL
BUN: 10 mg/dL (ref 6–23)
CO2: 32 mEq/L (ref 19–32)
Calcium: 9.3 mg/dL (ref 8.4–10.5)
Chloride: 103 mEq/L (ref 96–112)
Creatinine, Ser: 0.97 mg/dL (ref 0.40–1.50)
GFR: 71.82 mL/min (ref >60.00)
Glucose, Bld: 105 mg/dL — ABNORMAL HIGH (ref 70–99)
Potassium: 3.6 mEq/L (ref 3.5–5.1)
Sodium: 139 mEq/L (ref 135–145)

## 2021-06-14 NOTE — Progress Notes (Signed)
Subjective:   Ethan Anderson is a 86 y.o. male who presents for an Initial Medicare Annual Wellness Visit.  I connected with Ethan Anderson today by telephone and verified that I am speaking with the correct person using two identifiers. Location patient: home Location provider: work Persons participating in the virtual visit: patient, provider.   I discussed the limitations, risks, security and privacy concerns of performing an evaluation and management service by telephone and the availability of in person appointments. I also discussed with the patient that there may be a patient responsible charge related to this service. The patient expressed understanding and verbally consented to this telephonic visit.    Interactive audio and video telecommunications were attempted between this provider and patient, however failed, due to patient having technical difficulties OR patient did not have access to video capability.  We continued and completed visit with audio only.    Review of Systems     Cardiac Risk Factors include: advanced age (>77men, >38 women);dyslipidemia;male gender;hypertension     Objective:    Today's Vitals   There is no height or weight on file to calculate BMI.  Advanced Directives 06/14/2021 06/13/2020 05/27/2019 05/26/2019  Does Patient Have a Medical Advance Directive? No No Yes No  Type of Advance Directive - - Living will -  Does patient want to make changes to medical advance directive? - - No - Patient declined -  Would patient like information on creating a medical advance directive? No - Patient declined No - Patient declined No - Patient declined No - Patient declined    Current Medications (verified) Outpatient Encounter Medications as of 06/14/2021  Medication Sig   amLODipine (NORVASC) 5 MG tablet TAKE ONE (1) TABLET EACH DAY   azaTHIOprine (IMURAN) 50 MG tablet Take 100 mg by mouth daily.    furosemide (LASIX) 40 MG tablet TAKE 1 AND 1/2 TABLETS DAILY    ketorolac (ACULAR) 0.5 % ophthalmic solution Place 1 drop into the right eye 2 (two) times daily.    latanoprost (XALATAN) 0.005 % ophthalmic solution Place 1 drop into the right eye at bedtime.   LUMIGAN 0.01 % SOLN Place 1 drop into the right eye at bedtime.   Multiple Vitamins-Minerals (EYE VITAMINS PO) Take 2 capsules by mouth daily.    potassium chloride (KLOR-CON) 10 MEQ tablet TAKE ONE (1) TABLET EACH DAY   No facility-administered encounter medications on file as of 06/14/2021.    Allergies (verified) Patient has no known allergies.   History: Past Medical History:  Diagnosis Date   Arthritis    Cataract    ight eye and has been removed   Colon polyps    Depression    Glaucoma    HTN (hypertension)    Macular degeneration    Past Surgical History:  Procedure Laterality Date   CATARACT EXTRACTION Right    COLONOSCOPY     MULTIPLE TOOTH EXTRACTIONS     pt weard upper & lower dentures   POLYPECTOMY     UPPER GI ENDOSCOPY  09/21/2018   Family History  Problem Relation Age of Onset   Ovarian cancer Mother    Stomach cancer Mother    Dementia Father    Alcohol abuse Cousin    Thyroid cancer Son    Other Grandchild        Primary Sclerosing Cholangitis   Colon cancer Neg Hx    Esophageal cancer Neg Hx    Rectal cancer Neg Hx    Pancreatic  cancer Neg Hx    Prostate cancer Neg Hx    Social History   Socioeconomic History   Marital status: Married    Spouse name: Doris   Number of children: 6   Years of education: Not on file   Highest education level: Not on file  Occupational History   Occupation: retired  Tobacco Use   Smoking status: Former    Packs/day: 1.00    Years: 10.00    Pack years: 10.00    Types: Cigarettes    Quit date: 08/18/1990    Years since quitting: 30.8   Smokeless tobacco: Never  Vaping Use   Vaping Use: Never used  Substance and Sexual Activity   Alcohol use: No   Drug use: No   Sexual activity: Not on file  Other Topics  Concern   Not on file  Social History Narrative   Not on file   Social Determinants of Health   Financial Resource Strain: Low Risk    Difficulty of Paying Living Expenses: Not hard at all  Food Insecurity: No Food Insecurity   Worried About Charity fundraiser in the Last Year: Never true   [place] in the Last Year: Never true  Transportation Needs: No Transportation Needs   Lack of Transportation (Medical): No   Lack of Transportation (Non-Medical): No  Physical Activity: Sufficiently Active   Days of Exercise per Week: 3 days   Minutes of Exercise per Session: 60 min  Stress: No Stress Concern Present   Feeling of Stress : Not at all  Social Connections: Moderately Isolated   Frequency of Communication with Friends and Family: Twice a week   Frequency of Social Gatherings with Friends and Family: Twice a week   Attends Religious Services: Never   Printmaker: No   Attends Music therapist: Never   Marital Status: Married    Tobacco Counseling Counseling given: Not Answered   Clinical Intake:  Pre-visit preparation completed: Yes  Pain : No/denies pain     Nutritional Risks: None Diabetes: No  How often do you need to have someone help you when you read instructions, pamphlets, or other written materials from your doctor or pharmacy?: 1 - Never What is the last grade level you completed in school?: [place] Needed?: No  Information entered by :: L.Addy Mcmannis,LPN   Activities of Daily Living In your present state of health, do you have any difficulty performing the following activities: 06/14/2021  Hearing? N  Vision? N  Difficulty concentrating or making decisions? N  Walking or climbing stairs? N  Dressing or bathing? N  Doing errands, shopping? N  Preparing Food and eating ? N  Using the Toilet? N  In the past six months, have you accidently leaked urine? N  Do you have problems  with loss of bowel control? N  Managing your Medications? N  Managing your Finances? N  Housekeeping or managing your Housekeeping? N  Some recent data might be hidden    Patient Care Team: Eulas Post, MD as PCP - General (Family Medicine)  Indicate any recent Medical Services you may have received from other than [facility] providers in the past year (date may be approximate).     Assessment:   This is a routine wellness examination for [facility] [facility].  Hearing/Vision screen Vision Screening - Comments:: Annual eye exams wears glasses  Dietary issues and exercise activities discussed: Current Exercise Habits: Home  exercise routine, Type of exercise: walking, Time (Minutes): 60, Frequency (Times/Week): 3, Weekly Exercise (Minutes/Week): 180, Intensity: Mild, Exercise limited by: None identified   Goals Addressed             This Visit's Progress    Exercise 3x per week (30 min per time)   On track      Depression Screen PHQ 2/9 Scores 06/14/2021 06/14/2021 06/14/2021 06/13/2020 03/22/2018 10/05/2017 11/24/2016  PHQ - 2 Score 0 0 0 0 0 1 0  PHQ- 9 Score - - - 0 - - -  Exception Documentation - - - - - - -    Fall Risk Fall Risk  06/14/2021 06/14/2021 06/13/2020 03/22/2018 11/24/2016  Falls in the past year? 0 0 0 No No  Number falls in past yr: 0 - 0 - -  Injury with Fall? 0 - 0 - -  Risk for fall due to : - - No Fall Risks;Impaired vision - -  Follow up Falls evaluation completed - Follow up appointment - -    FALL RISK PREVENTION PERTAINING TO THE HOME:  Any stairs in or around the home? No  If so, are there any without handrails? No  Home free of loose throw rugs in walkways, pet beds, electrical cords, etc? Yes  Adequate lighting in your home to reduce risk of falls? Yes   ASSISTIVE DEVICES UTILIZED TO PREVENT FALLS:  Life alert? No  Use of a cane, walker or w/c? No  Grab bars in the bathroom? Yes  Shower chair or bench in shower? Yes  Elevated toilet seat or a handicapped  toilet? No    Cognitive Function:    Normal cognitive status assessed by direct observation by this Nurse Health Advisor. No abnormalities found.      Immunizations Immunization History  Administered Date(s) Administered   Fluad Quad(high Dose 65+) 05/30/2019, 05/07/2020   Hepatitis A, Adult 09/07/2018, 03/09/2019   Influenza, High Dose Seasonal PF 05/26/2016, 06/22/2018   Moderna Sars-Covid-2 Vaccination 08/23/2019, 10/03/2019, 04/02/2020   Pneumococcal Conjugate-13 03/06/2015   Pneumococcal Polysaccharide-23 08/19/2011   Tdap 08/19/2011    TDAP status: Up to date  Flu Vaccine status: Up to date  Pneumococcal vaccine status: Up to date  Covid-19 vaccine status: Completed vaccines  Qualifies for Shingles Vaccine? Yes   Zostavax completed No   Shingrix Completed?: No.    Education has been provided regarding the importance of this vaccine. Patient has been advised to call insurance company to determine out of pocket expense if they have not yet received this vaccine. Advised may also receive vaccine at local pharmacy or Health Dept. Verbalized acceptance and understanding.  Screening Tests Health Maintenance  Topic Date Due   Zoster Vaccines- Shingrix (1 of 2) Never done   COVID-19 Vaccine (4 - Booster for Moderna series) 05/28/2020   INFLUENZA VACCINE  03/11/2021   TETANUS/TDAP  08/18/2021   Pneumonia Vaccine 83+ Years old  Completed   HPV VACCINES  Aged Out    Health Maintenance  Health Maintenance Due  Topic Date Due   Zoster Vaccines- Shingrix (1 of 2) Never done   COVID-19 Vaccine (4 - Booster for Moderna series) 05/28/2020   INFLUENZA VACCINE  03/11/2021    Colorectal cancer screening: No longer required.   Lung Cancer Screening: (Low Dose CT Chest recommended if Age 54-80 years, 30 pack-year currently smoking OR have quit w/in 15years.) does not qualify.   Lung Cancer Screening Referral: n/a  Additional Screening:  Hepatitis C Screening:  does not  qualify;A positive test   Vision Screening: Recommended annual ophthalmology exams for early detection of glaucoma and other disorders of the eye. Is the patient up to date with their annual eye exam?  Yes  Who is the provider or what is the name of the office in which the patient attends annual eye exams? Dr.Davis If pt is not established with a provider, would they like to be referred to a provider to establish care? No .   Dental Screening: Recommended annual dental exams for proper oral hygiene  Community Resource Referral / Chronic Care Management: CRR required this visit?  No   CCM required this visit?  No      Plan:     I have personally reviewed and noted the following in the patient's chart:   Medical and social history Use of alcohol, tobacco or illicit drugs  Current medications and supplements including opioid prescriptions. Patient is not currently taking opioid prescriptions. Functional ability and status Nutritional status Physical activity Advanced directives List of other physicians Hospitalizations, surgeries, and ER visits in previous 12 months Vitals Screenings to include cognitive, depression, and falls Referrals and appointments  In addition, I have reviewed and discussed with patient certain preventive protocols, quality metrics, and best practice recommendations. A written personalized care plan for preventive services as well as general preventive health recommendations were provided to patient.     Randel Pigg, LPN   81/03/[id number]   Nurse Notes: none

## 2021-06-14 NOTE — Progress Notes (Signed)
Established Patient Office Visit  Subjective:  Patient ID: Ethan Anderson, male    DOB: [birth year]  Age: 86 y.o. MRN: [id number]  CC: No chief complaint on file.   HPI Ethan Anderson presents for physical exam.  He has history of hypertension, autoimmune hepatitis followed by hep otology clinic, history of positive hepatitis C antibody test, macular degeneration, history of gout, glaucoma.  Also followed regularly by ophthalmologist.  Health maintenance reviewed.  He needs flu vaccine.  No history of Shingrix.  He has had clinical case of shingles previously.  No indication for PSA or colonoscopy based on age  Social history and family history reviewed with no significant changes.  No recent falls.  Good balance.  Slightly diminished appetite.  Mild weight loss since last year but he attributes this to scaling back overall calories intentionally.  Past Medical History:  Diagnosis Date   Arthritis    Cataract    ight eye and has been removed   Colon polyps    Depression    Glaucoma    HTN (hypertension)    Macular degeneration     Past Surgical History:  Procedure Laterality Date   CATARACT EXTRACTION Right    COLONOSCOPY     MULTIPLE TOOTH EXTRACTIONS     pt weard upper & lower dentures   POLYPECTOMY     UPPER GI ENDOSCOPY  09/21/2018    Family History  Problem Relation Age of Onset   Ovarian cancer Mother    Stomach cancer Mother    Dementia Father    Alcohol abuse Cousin    Thyroid cancer Son    Other Grandchild        Primary Sclerosing Cholangitis   Colon cancer Neg Hx    Esophageal cancer Neg Hx    Rectal cancer Neg Hx    Pancreatic cancer Neg Hx    Prostate cancer Neg Hx     Social History   Socioeconomic History   Marital status: Married    Spouse name: Doris   Number of children: 6   Years of education: Not on file   Highest education level: Not on file  Occupational History   Occupation: retired  Tobacco Use   Smoking status: Former     Packs/day: 1.00    Years: 10.00    Pack years: 10.00    Types: Cigarettes    Quit date: 08/18/1990    Years since quitting: 30.8   Smokeless tobacco: Never  Vaping Use   Vaping Use: Never used  Substance and Sexual Activity   Alcohol use: No   Drug use: No   Sexual activity: Not on file  Other Topics Concern   Not on file  Social History Narrative   Not on file   Social Determinants of Health   Financial Resource Strain: Not on file  Food Insecurity: Not on file  Transportation Needs: Not on file  Physical Activity: Not on file  Stress: Not on file  Social Connections: Not on file  Intimate Partner Violence: Not on file    Outpatient Medications Prior to Visit  Medication Sig Dispense Refill   amLODipine (NORVASC) 5 MG tablet TAKE ONE (1) TABLET EACH DAY 90 tablet 2   azaTHIOprine (IMURAN) 50 MG tablet Take 100 mg by mouth daily.      furosemide (LASIX) 40 MG tablet TAKE 1 AND 1/2 TABLETS DAILY 135 tablet 0   ketorolac (ACULAR) 0.5 % ophthalmic solution Place 1 drop into the right  eye 2 (two) times daily.      latanoprost (XALATAN) 0.005 % ophthalmic solution Place 1 drop into the right eye at bedtime.     LUMIGAN 0.01 % SOLN Place 1 drop into the right eye at bedtime.     Multiple Vitamins-Minerals (EYE VITAMINS PO) Take 2 capsules by mouth daily.      potassium chloride (KLOR-CON) 10 MEQ tablet TAKE ONE (1) TABLET EACH DAY 90 tablet 3   No facility-administered medications prior to visit.    No Known Allergies  ROS Review of Systems  Constitutional:  Negative for activity change, fatigue and fever.  HENT:  Negative for congestion, ear pain and trouble swallowing.   Eyes:  Negative for pain and visual disturbance.  Respiratory:  Negative for cough, shortness of breath and wheezing.   Cardiovascular:  Negative for chest pain and palpitations.  Gastrointestinal:  Negative for abdominal distention, abdominal pain, blood in stool, constipation, diarrhea, nausea, rectal  pain and vomiting.  Genitourinary:  Negative for dysuria, hematuria and testicular pain.  Musculoskeletal:  Negative for arthralgias and joint swelling.  Skin:  Negative for rash.  Neurological:  Negative for dizziness, syncope and headaches.  Hematological:  Negative for adenopathy.  Psychiatric/Behavioral:  Negative for confusion and dysphoric mood.      Objective:    Physical Exam Constitutional:      General: He is not in acute distress.    Appearance: He is well-developed.  HENT:     Head: Normocephalic and atraumatic.     Right Ear: External ear normal.     Left Ear: External ear normal.  Eyes:     Conjunctiva/sclera: Conjunctivae normal.     Pupils: Pupils are equal, round, and reactive to light.  Neck:     Thyroid: No thyromegaly.  Cardiovascular:     Rate and Rhythm: Normal rate and regular rhythm.     Heart sounds: Normal heart sounds. No murmur heard. Pulmonary:     Effort: No respiratory distress.     Breath sounds: No wheezing or rales.  Abdominal:     General: Bowel sounds are normal. There is no distension.     Palpations: Abdomen is soft. There is no mass.     Tenderness: There is no abdominal tenderness. There is no guarding or rebound.  Musculoskeletal:     Cervical back: Normal range of motion and neck supple.     Comments: Trace/minimal edema lower legs bilaterally  Lymphadenopathy:     Cervical: No cervical adenopathy.  Skin:    Findings: No rash.  Neurological:     Mental Status: He is alert and oriented to person, place, and time.     Cranial Nerves: No cranial nerve deficit.    BP 140/80 (BP Location: Left Arm, Patient Position: Sitting, Cuff Size: Normal)   Pulse 90   Temp 98.1 F (36.7 C) (Oral)   Ht 5\' 8"  (1.727 m)   Wt 195 lb 3.2 oz (88.5 kg)   SpO2 99%   BMI 29.68 kg/m  Wt Readings from Last 3 Encounters:  06/14/21 195 lb 3.2 oz (88.5 kg)  09/11/20 204 lb (92.5 kg)  05/07/20 197 lb 12.8 oz (89.7 kg)     Health Maintenance Due   Topic Date Due   Zoster Vaccines- Shingrix (1 of 2) Never done   COVID-19 Vaccine (4 - Booster for Moderna series) 05/28/2020   INFLUENZA VACCINE  03/11/2021    There are no preventive care reminders to display for this patient.  Lab Results  Component Value Date   TSH 5.52 (H) 02/08/2018   Lab Results  Component Value Date   WBC 5.2 05/29/2019   HGB 11.4 (L) 05/29/2019   HCT 33.1 (L) 05/29/2019   MCV 92.5 05/29/2019   PLT 144 (L) 05/29/2019   Lab Results  Component Value Date   NA 138 05/07/2020   K 3.7 05/07/2020   CO2 30 05/07/2020   GLUCOSE 94 05/07/2020   BUN 14 05/07/2020   CREATININE 1.02 05/07/2020   BILITOT 1.0 05/30/2019   ALKPHOS 59 05/30/2019   AST 46 (H) 05/30/2019   ALT 29 05/30/2019   PROT 5.6 (L) 05/30/2019   ALBUMIN 1.9 (L) 05/30/2019   CALCIUM 9.0 05/07/2020   ANIONGAP 5 05/30/2019   GFR 92.96 04/04/2019   Lab Results  Component Value Date   CHOL 215 (H) 11/24/2016   Lab Results  Component Value Date   HDL 51.50 11/24/2016   Lab Results  Component Value Date   LDLCALC 137 (H) 11/24/2016   Lab Results  Component Value Date   TRIG 130.0 11/24/2016   Lab Results  Component Value Date   CHOLHDL 4 11/24/2016   No results found for: HGBA1C    Assessment & Plan:   Problem List Items Addressed This Visit   None Visit Diagnoses     Physical exam    -  Primary   Relevant Orders   Basic metabolic panel   Lipid panel   Hepatic function panel   CBC with Differential/Platelet     Patient generally doing well.  He has autoimmune hepatitis and has been stable on Imuran.  Flu vaccine given.  We discussed Shingrix vaccine and he will check on insurance coverage and consider checking at pharmacy regarding this.  -Obtain labs as above. -Aged out of PSA and colonoscopy screening. -Pneumonia vaccines complete  No orders of the defined types were placed in this encounter.   Follow-up: No follow-ups on file.    Carolann Littler, MD

## 2021-06-14 NOTE — Patient Instructions (Signed)
Ethan Anderson , Thank you for taking time to come for your Medicare Wellness Visit. I appreciate your ongoing commitment to your health goals. Please review the following plan we discussed and let me know if I can assist you in the future.   Screening recommendations/referrals: Colonoscopy: no longer required  Recommended yearly ophthalmology/optometry visit for glaucoma screening and checkup Recommended yearly dental visit for hygiene and checkup  Vaccinations: Influenza vaccine: completed  Pneumococcal vaccine: completed  Tdap vaccine: 08/19/2011  due 2023 Shingles vaccine: will consider     Advanced directives: none   Conditions/risks identified: none   Next appointment: none   Preventive Care 84 Years and Older, Male Preventive care refers to lifestyle choices and visits with your health care provider that can promote health and wellness. What does preventive care include? A yearly physical exam. This is also called an annual well check. Dental exams once or twice a year. Routine eye exams. Ask your health care provider how often you should have your eyes checked. Personal lifestyle choices, including: Daily care of your teeth and gums. Regular physical activity. Eating a healthy diet. Avoiding tobacco and drug use. Limiting alcohol use. Practicing safe sex. Taking low doses of aspirin every day. Taking vitamin and mineral supplements as recommended by your health care provider. What happens during an annual well check? The services and screenings done by your health care provider during your annual well check will depend on your age, overall health, lifestyle risk factors, and family history of disease. Counseling  Your health care provider may ask you questions about your: Alcohol use. Tobacco use. Drug use. Emotional well-being. Home and relationship well-being. Sexual activity. Eating habits. History of falls. Memory and ability to understand (cognition). Work and  work Statistician. Screening  You may have the following tests or measurements: Height, weight, and BMI. Blood pressure. Lipid and cholesterol levels. These may be checked every 5 years, or more frequently if you are over 82 years old. Skin check. Lung cancer screening. You may have this screening every year starting at age 18 if you have a 30-pack-year history of smoking and currently smoke or have quit within the past 15 years. Fecal occult blood test (FOBT) of the stool. You may have this test every year starting at age 42. Flexible sigmoidoscopy or colonoscopy. You may have a sigmoidoscopy every 5 years or a colonoscopy every 10 years starting at age 30. Prostate cancer screening. Recommendations will vary depending on your family history and other risks. Hepatitis C blood test. Hepatitis B blood test. Sexually transmitted disease (STD) testing. Diabetes screening. This is done by checking your blood sugar (glucose) after you have not eaten for a while (fasting). You may have this done every 1-3 years. Abdominal aortic aneurysm (AAA) screening. You may need this if you are a current or former smoker. Osteoporosis. You may be screened starting at age 4 if you are at high risk. Talk with your health care provider about your test results, treatment options, and if necessary, the need for more tests. Vaccines  Your health care provider may recommend certain vaccines, such as: Influenza vaccine. This is recommended every year. Tetanus, diphtheria, and acellular pertussis (Tdap, Td) vaccine. You may need a Td booster every 10 years. Zoster vaccine. You may need this after age 78. Pneumococcal 13-valent conjugate (PCV13) vaccine. One dose is recommended after age 72. Pneumococcal polysaccharide (PPSV23) vaccine. One dose is recommended after age 42. Talk to your health care provider about which screenings and vaccines  you need and how often you need them. This information is not intended to  replace advice given to you by your health care provider. Make sure you discuss any questions you have with your health care provider. Document Released: 08/24/2015 Document Revised: 04/16/2016 Document Reviewed: 05/29/2015 Elsevier Interactive Patient Education  2017 [place] Prevention in the Home Falls can cause injuries. They can happen to people of all ages. There are many things you can do to make your home safe and to help prevent falls. What can I do on the outside of my home? Regularly fix the edges of walkways and driveways and fix any cracks. Remove anything that might make you trip as you walk through a door, such as a raised step or threshold. Trim any bushes or trees on the path to your home. Use bright outdoor lighting. Clear any walking paths of anything that might make someone trip, such as rocks or tools. Regularly check to see if handrails are loose or broken. Make sure that both sides of any steps have handrails. Any raised decks and porches should have guardrails on the edges. Have any leaves, snow, or ice cleared regularly. Use sand or salt on walking paths during winter. Clean up any spills in your garage right away. This includes oil or grease spills. What can I do in the bathroom? Use night lights. Install grab bars by the toilet and in the tub and shower. Do not use towel bars as grab bars. Use non-skid mats or decals in the tub or shower. If you need to sit down in the shower, use a plastic, non-slip stool. Keep the floor dry. Clean up any water that spills on the floor as soon as it happens. Remove soap buildup in the tub or shower regularly. Attach bath mats securely with double-sided non-slip rug tape. Do not have throw rugs and other things on the floor that can make you trip. What can I do in the bedroom? Use night lights. Make sure that you have a light by your bed that is easy to reach. Do not use any sheets or blankets that are too big for  your bed. They should not hang down onto the floor. Have a firm chair that has side arms. You can use this for support while you get dressed. Do not have throw rugs and other things on the floor that can make you trip. What can I do in the kitchen? Clean up any spills right away. Avoid walking on wet floors. Keep items that you use a lot in easy-to-reach places. If you need to reach something above you, use a strong step stool that has a grab bar. Keep electrical cords out of the way. Do not use floor polish or wax that makes floors slippery. If you must use wax, use non-skid floor wax. Do not have throw rugs and other things on the floor that can make you trip. What can I do with my stairs? Do not leave any items on the stairs. Make sure that there are handrails on both sides of the stairs and use them. Fix handrails that are broken or loose. Make sure that handrails are as long as the stairways. Check any carpeting to make sure that it is firmly attached to the stairs. Fix any carpet that is loose or worn. Avoid having throw rugs at the top or bottom of the stairs. If you do have throw rugs, attach them to the floor with carpet tape. Make sure  that you have a light switch at the top of the stairs and the bottom of the stairs. If you do not have them, ask someone to add them for you. What else can I do to help prevent falls? Wear shoes that: Do not have high heels. Have rubber bottoms. Are comfortable and fit you well. Are closed at the toe. Do not wear sandals. If you use a stepladder: Make sure that it is fully opened. Do not climb a closed stepladder. Make sure that both sides of the stepladder are locked into place. Ask someone to hold it for you, if possible. Clearly mark and make sure that you can see: Any grab bars or handrails. First and last steps. Where the edge of each step is. Use tools that help you move around (mobility aids) if they are needed. These  include: Canes. Walkers. Scooters. Crutches. Turn on the lights when you go into a dark area. Replace any light bulbs as soon as they burn out. Set up your furniture so you have a clear path. Avoid moving your furniture around. If any of your floors are uneven, fix them. If there are any pets around you, be aware of where they are. Review your medicines with your doctor. Some medicines can make you feel dizzy. This can increase your chance of falling. Ask your doctor what other things that you can do to help prevent falls. This information is not intended to replace advice given to you by your health care provider. Make sure you discuss any questions you have with your health care provider. Document Released: 05/24/2009 Document Revised: 01/03/2016 Document Reviewed: 09/01/2014 Elsevier Interactive Patient Education  2017 [employer].

## 2021-06-14 NOTE — Addendum Note (Signed)
Addended by: Amanda Cockayne on: 06/14/2021 09:12 AM   Modules accepted: Orders

## 2021-07-18 ENCOUNTER — Other Ambulatory Visit: Payer: Self-pay | Admitting: Family Medicine

## 2021-07-23 ENCOUNTER — Other Ambulatory Visit: Payer: Self-pay | Admitting: Nurse Practitioner

## 2021-07-23 DIAGNOSIS — K7469 Other cirrhosis of liver: Secondary | ICD-10-CM

## 2021-07-23 DIAGNOSIS — K754 Autoimmune hepatitis: Secondary | ICD-10-CM

## 2021-07-26 ENCOUNTER — Ambulatory Visit
Admission: RE | Admit: 2021-07-26 | Discharge: 2021-07-26 | Disposition: A | Discharge status: Home / Self Care | Payer: Medicare Other | Source: Ambulatory Visit | Attending: Nurse Practitioner | Admitting: Nurse Practitioner

## 2021-07-26 DIAGNOSIS — K754 Autoimmune hepatitis: Secondary | ICD-10-CM

## 2021-07-26 DIAGNOSIS — K7469 Other cirrhosis of liver: Secondary | ICD-10-CM

## 2021-08-28 ENCOUNTER — Other Ambulatory Visit: Payer: Self-pay | Admitting: Family Medicine

## 2021-09-26 ENCOUNTER — Other Ambulatory Visit: Payer: Self-pay | Admitting: Family Medicine

## 2022-01-11 ENCOUNTER — Other Ambulatory Visit: Payer: Self-pay | Admitting: Family Medicine

## 2022-01-24 ENCOUNTER — Other Ambulatory Visit: Payer: Self-pay | Admitting: Nurse Practitioner

## 2022-01-24 DIAGNOSIS — K754 Autoimmune hepatitis: Secondary | ICD-10-CM

## 2022-01-27 ENCOUNTER — Ambulatory Visit
Admission: RE | Admit: 2022-01-27 | Discharge: 2022-01-27 | Disposition: A | Discharge status: Home / Self Care | Payer: Medicare Other | Source: Ambulatory Visit | Attending: Nurse Practitioner | Admitting: Nurse Practitioner

## 2022-01-27 DIAGNOSIS — K754 Autoimmune hepatitis: Secondary | ICD-10-CM

## 2022-04-04 ENCOUNTER — Encounter (INDEPENDENT_AMBULATORY_CARE_PROVIDER_SITE_OTHER): Payer: Medicare Other | Admitting: Ophthalmology

## 2022-04-04 DIAGNOSIS — H34812 Central retinal vein occlusion, left eye, with macular edema: Secondary | ICD-10-CM

## 2022-04-04 DIAGNOSIS — I10 Essential (primary) hypertension: Secondary | ICD-10-CM

## 2022-04-04 DIAGNOSIS — H353132 Nonexudative age-related macular degeneration, bilateral, intermediate dry stage: Secondary | ICD-10-CM

## 2022-04-04 DIAGNOSIS — H35033 Hypertensive retinopathy, bilateral: Secondary | ICD-10-CM

## 2022-04-04 DIAGNOSIS — H43813 Vitreous degeneration, bilateral: Secondary | ICD-10-CM

## 2022-04-12 ENCOUNTER — Other Ambulatory Visit: Payer: Self-pay | Admitting: Family Medicine

## 2022-05-22 ENCOUNTER — Other Ambulatory Visit: Payer: Self-pay | Admitting: Family Medicine

## 2022-06-28 ENCOUNTER — Other Ambulatory Visit: Payer: Self-pay | Admitting: Family Medicine

## 2022-07-05 ENCOUNTER — Other Ambulatory Visit: Payer: Self-pay | Admitting: Family Medicine

## 2022-07-11 ENCOUNTER — Ambulatory Visit (INDEPENDENT_AMBULATORY_CARE_PROVIDER_SITE_OTHER): Payer: Medicare Other

## 2022-07-11 VITALS — Wt 196.0 lb

## 2022-07-11 DIAGNOSIS — Z0000 Encounter for general adult medical examination without abnormal findings: Secondary | ICD-10-CM | POA: Diagnosis not present

## 2022-07-11 NOTE — Patient Instructions (Addendum)
Mr. Ethan Anderson , Thank you for taking time to come for your Medicare Wellness Visit. I appreciate your ongoing commitment to your health goals. Please review the following plan we discussed and let me know if I can assist you in the future.   These are the goals we discussed:  Goals       Exercise 3x per week (30 min per time)      No current goals (pt-stated)        This is a list of the screening recommended for you and due dates:  Health Maintenance  Topic Date Due   DTaP/Tdap/Td vaccine (2 - Td or Tdap) 08/18/2021   COVID-19 Vaccine (4 - 2023-24 season) 07/27/2022*   Zoster (Shingles) Vaccine (1 of 2) 10/10/2022*   Flu Shot  11/09/2022*   Medicare Annual Wellness Visit  07/12/2023   Pneumonia Vaccine  Completed   HPV Vaccine  Aged Out  *Topic was postponed. The date shown is not the original due date.    Advanced directives: Please bring a copy of your health care power of attorney and living will to the office to be added to your chart at your convenience.   Conditions/risks identified: None  Next appointment: Follow up in one year for your annual wellness visit.    Preventive Care 67 Years and Older, Male  Preventive care refers to lifestyle choices and visits with your health care provider that can promote health and wellness. What does preventive care include? A yearly physical exam. This is also called an annual well check. Dental exams once or twice a year. Routine eye exams. Ask your health care provider how often you should have your eyes checked. Personal lifestyle choices, including: Daily care of your teeth and gums. Regular physical activity. Eating a healthy diet. Avoiding tobacco and drug use. Limiting alcohol use. Practicing safe sex. Taking low doses of aspirin every day. Taking vitamin and mineral supplements as recommended by your health care provider. What happens during an annual well check? The services and screenings done by your health care  provider during your annual well check will depend on your age, overall health, lifestyle risk factors, and family history of disease. Counseling  Your health care provider may ask you questions about your: Alcohol use. Tobacco use. Drug use. Emotional well-being. Home and relationship well-being. Sexual activity. Eating habits. History of falls. Memory and ability to understand (cognition). Work and work Statistician. Screening  You may have the following tests or measurements: Height, weight, and BMI. Blood pressure. Lipid and cholesterol levels. These may be checked every 5 years, or more frequently if you are over 84 years old. Skin check. Lung cancer screening. You may have this screening every year starting at age 36 if you have a 30-pack-year history of smoking and currently smoke or have quit within the past 15 years. Fecal occult blood test (FOBT) of the stool. You may have this test every year starting at age 40. Flexible sigmoidoscopy or colonoscopy. You may have a sigmoidoscopy every 5 years or a colonoscopy every 10 years starting at age 33. Prostate cancer screening. Recommendations will vary depending on your family history and other risks. Hepatitis C blood test. Hepatitis B blood test. Sexually transmitted disease (STD) testing. Diabetes screening. This is done by checking your blood sugar (glucose) after you have not eaten for a while (fasting). You may have this done every 1-3 years. Abdominal aortic aneurysm (AAA) screening. You may need this if you are a current or  former smoker. Osteoporosis. You may be screened starting at age 66 if you are at high risk. Talk with your health care provider about your test results, treatment options, and if necessary, the need for more tests. Vaccines  Your health care provider may recommend certain vaccines, such as: Influenza vaccine. This is recommended every year. Tetanus, diphtheria, and acellular pertussis (Tdap, Td)  vaccine. You may need a Td booster every 10 years. Zoster vaccine. You may need this after age 85. Pneumococcal 13-valent conjugate (PCV13) vaccine. One dose is recommended after age 85. Pneumococcal polysaccharide (PPSV23) vaccine. One dose is recommended after age 85. Talk to your health care provider about which screenings and vaccines you need and how often you need them. This information is not intended to replace advice given to you by your health care provider. Make sure you discuss any questions you have with your health care provider. Document Released: 08/24/2015 Document Revised: 04/16/2016 Document Reviewed: 05/29/2015 Elsevier Interactive Patient Education  2017 [place] Prevention in the Home Falls can cause injuries. They can happen to people of all ages. There are many things you can do to make your home safe and to help prevent falls. What can I do on the outside of my home? Regularly fix the edges of walkways and driveways and fix any cracks. Remove anything that might make you trip as you walk through a door, such as a raised step or threshold. Trim any bushes or trees on the path to your home. Use bright outdoor lighting. Clear any walking paths of anything that might make someone trip, such as rocks or tools. Regularly check to see if handrails are loose or broken. Make sure that both sides of any steps have handrails. Any raised decks and porches should have guardrails on the edges. Have any leaves, snow, or ice cleared regularly. Use sand or salt on walking paths during winter. Clean up any spills in your garage right away. This includes oil or grease spills. What can I do in the bathroom? Use night lights. Install grab bars by the toilet and in the tub and shower. Do not use towel bars as grab bars. Use non-skid mats or decals in the tub or shower. If you need to sit down in the shower, use a plastic, non-slip stool. Keep the floor dry. Clean up any  water that spills on the floor as soon as it happens. Remove soap buildup in the tub or shower regularly. Attach bath mats securely with double-sided non-slip rug tape. Do not have throw rugs and other things on the floor that can make you trip. What can I do in the bedroom? Use night lights. Make sure that you have a light by your bed that is easy to reach. Do not use any sheets or blankets that are too big for your bed. They should not hang down onto the floor. Have a firm chair that has side arms. You can use this for support while you get dressed. Do not have throw rugs and other things on the floor that can make you trip. What can I do in the kitchen? Clean up any spills right away. Avoid walking on wet floors. Keep items that you use a lot in easy-to-reach places. If you need to reach something above you, use a strong step stool that has a grab bar. Keep electrical cords out of the way. Do not use floor polish or wax that makes floors slippery. If you must use wax, use  non-skid floor wax. Do not have throw rugs and other things on the floor that can make you trip. What can I do with my stairs? Do not leave any items on the stairs. Make sure that there are handrails on both sides of the stairs and use them. Fix handrails that are broken or loose. Make sure that handrails are as long as the stairways. Check any carpeting to make sure that it is firmly attached to the stairs. Fix any carpet that is loose or worn. Avoid having throw rugs at the top or bottom of the stairs. If you do have throw rugs, attach them to the floor with carpet tape. Make sure that you have a light switch at the top of the stairs and the bottom of the stairs. If you do not have them, ask someone to add them for you. What else can I do to help prevent falls? Wear shoes that: Do not have high heels. Have rubber bottoms. Are comfortable and fit you well. Are closed at the toe. Do not wear sandals. If you use a  stepladder: Make sure that it is fully opened. Do not climb a closed stepladder. Make sure that both sides of the stepladder are locked into place. Ask someone to hold it for you, if possible. Clearly Norma and make sure that you can see: Any grab bars or handrails. First and last steps. Where the edge of each step is. Use tools that help you move around (mobility aids) if they are needed. These include: Canes. Walkers. Scooters. Crutches. Turn on the lights when you go into a dark area. Replace any light bulbs as soon as they burn out. Set up your furniture so you have a clear path. Avoid moving your furniture around. If any of your floors are uneven, fix them. If there are any pets around you, be aware of where they are. Review your medicines with your doctor. Some medicines can make you feel dizzy. This can increase your chance of falling. Ask your doctor what other things that you can do to help prevent falls. This information is not intended to replace advice given to you by your health care provider. Make sure you discuss any questions you have with your health care provider. Document Released: 05/24/2009 Document Revised: 01/03/2016 Document Reviewed: 09/01/2014 Elsevier Interactive Patient Education  2017 [employer].

## 2022-07-11 NOTE — Progress Notes (Signed)
Subjective:   Ethan Anderson is a 87 y.o. male who presents for Medicare Annual/Subsequent preventive examination.  Review of Systems    Virtual Visit via Telephone Note  I connected with  Ethan Anderson on 07/11/22 at  1:15 PM EST by telephone and verified that I am speaking with the correct person using two identifiers.  Location: Patient: Home Provider: Office Persons participating in the virtual visit: patient/Nurse Health Advisor   I discussed the limitations, risks, security and privacy concerns of performing an evaluation and management service by telephone and the availability of in person appointments. The patient expressed understanding and agreed to proceed.  Interactive audio and video telecommunications were attempted between this nurse and patient, however failed, due to patient having technical difficulties OR patient did not have access to video capability.  We continued and completed visit with audio only.  Some vital signs may be absent or patient reported.   Criselda Peaches, LPN  Cardiac Risk Factors include: advanced age (>51mn, >>43women);hypertension;male gender     Objective:    Today's Vitals   07/11/22 1321  Weight: 196 lb (88.9 kg)   Body mass index is 29.8 kg/m.     07/11/2022    1:27 PM 06/14/2021    9:08 AM 06/13/2020    8:29 AM 05/27/2019    2:52 AM 05/26/2019    5:28 PM  Advanced Directives  Does Patient Have a Medical Advance Directive? Yes No No Yes No  Type of [place]   Living will   Does patient want to make changes to medical advance directive?    No - Patient declined   Copy of [place]? No - copy requested      Would patient like information on creating a medical advance directive?  No - Patient declined No - Patient declined No - Patient declined No - Patient declined    Current Medications (verified) Outpatient Encounter Medications as of 07/11/2022   Medication Sig   amLODipine (NORVASC) 5 MG tablet TAKE ONE (1) TABLET EACH DAY.  *appointment required for future refills   azaTHIOprine (IMURAN) 50 MG tablet Take 100 mg by mouth daily.    furosemide (LASIX) 40 MG tablet TAKE 1 AND 1/2 TABLETS DAILY   ketorolac (ACULAR) 0.5 % ophthalmic solution Place 1 drop into the right eye 2 (two) times daily.    latanoprost (XALATAN) 0.005 % ophthalmic solution Place 1 drop into the right eye at bedtime.   LUMIGAN 0.01 % SOLN Place 1 drop into the right eye at bedtime.   Multiple Vitamins-Minerals (EYE VITAMINS PO) Take 2 capsules by mouth daily.    potassium chloride (KLOR-CON) 10 MEQ tablet TAKE ONE (1) TABLET EACH DAY   No facility-administered encounter medications on file as of 07/11/2022.    Allergies (verified) Patient has no known allergies.   History: Past Medical History:  Diagnosis Date   Arthritis    Cataract    ight eye and has been removed   Colon polyps    Depression    Glaucoma    HTN (hypertension)    Macular degeneration    Past Surgical History:  Procedure Laterality Date   CATARACT EXTRACTION Right    COLONOSCOPY     MULTIPLE TOOTH EXTRACTIONS     pt weard upper & lower dentures   POLYPECTOMY     UPPER GI ENDOSCOPY  09/21/2018   Family History  Problem Relation Age  of Onset   Ovarian cancer Mother    Stomach cancer Mother    Dementia Father    Alcohol abuse Cousin    Thyroid cancer Son    Other Grandchild        Primary Sclerosing Cholangitis   Colon cancer Neg Hx    Esophageal cancer Neg Hx    Rectal cancer Neg Hx    Pancreatic cancer Neg Hx    Prostate cancer Neg Hx    Social History   Socioeconomic History   Marital status: Married    Spouse name: Doris   Number of children: 6   Years of education: Not on file   Highest education level: Not on file  Occupational History   Occupation: retired  Tobacco Use   Smoking status: Former    Packs/day: 1.00    Years: 10.00    Total pack years:  10.00    Types: Cigarettes    Quit date: 08/18/1990    Years since quitting: 31.9   Smokeless tobacco: Never  Vaping Use   Vaping Use: Never used  Substance and Sexual Activity   Alcohol use: No   Drug use: No   Sexual activity: Not on file  Other Topics Concern   Not on file  Social History Narrative   Not on file   Social Determinants of Health   Financial Resource Strain: Low Risk  (07/11/2022)   Overall Financial Resource Strain (CARDIA)    Difficulty of Paying Living Expenses: Not hard at all  Food Insecurity: No Food Insecurity (07/11/2022)   Hunger Vital Sign    Worried About Running Out of Food in the Last Year: Never true    [place] in the Last Year: Never true  Transportation Needs: No Transportation Needs (07/11/2022)   PRAPARE - Hydrologist (Medical): No    Lack of Transportation (Non-Medical): No  Physical Activity: Inactive (07/11/2022)   Exercise Vital Sign    Days of Exercise per Week: 0 days    Minutes of Exercise per Session: 0 min  Stress: No Stress Concern Present (07/11/2022)   [place]    Feeling of Stress : Not at all  Social Connections: [place] (07/11/2022)   Social Connection and Isolation Panel [NHANES]    Frequency of Communication with Friends and Family: More than three times a week    Frequency of Social Gatherings with Friends and Family: More than three times a week    Attends Religious Services: More than 4 times per year    Active Member of [employer] or Organizations: Yes    Attends Music therapist: More than 4 times per year    Marital Status: Married    Tobacco Counseling Counseling given: Not Answered   Clinical Intake:  Pre-visit preparation completed: No  Pain : No/denies pain     Nutritional Risks: None Diabetes: No  How often do you need to have someone help you when you read instructions,  pamphlets, or other written materials from your doctor or pharmacy?: 3 - Sometimes (Wife assist)  Diabetic?  No  Interpreter Needed?: No  Information entered by :: Rolene Arbour LPN   Activities of Daily Living    07/11/2022    1:26 PM  In your present state of health, do you have any difficulty performing the following activities:  Hearing? 0  Vision? 0  Difficulty concentrating or making decisions? 0  Walking  or climbing stairs? 0  Dressing or bathing? 0  Doing errands, shopping? 0  Preparing Food and eating ? N  Using the Toilet? N  In the past six months, have you accidently leaked urine? N  Do you have problems with loss of bowel control? N  Managing your Medications? N  Managing your Finances? N  Housekeeping or managing your Housekeeping? N    Patient Care Team: Eulas Post, MD as PCP - General (Family Medicine)  Indicate any recent Medical Services you may have received from other than [facility] providers in the past year (date may be approximate).     Assessment:   This is a routine wellness examination for [facility].  Hearing/Vision screen Hearing Screening - Comments:: Denies hearing difficulties   Vision Screening - Comments:: Wears rx glasses - up to date with routine eye exams with  Dr Rosana Hoes  Dietary issues and exercise activities discussed: Current Exercise Habits: The patient does not participate in regular exercise at present, Exercise limited by: None identified   Goals Addressed               This Visit's Progress     No current goals (pt-stated)         Depression Screen    07/11/2022    1:26 PM 06/14/2021    9:08 AM 06/14/2021    9:06 AM 06/14/2021    8:41 AM 06/13/2020    8:32 AM 03/22/2018   10:07 AM 10/05/2017   10:57 AM  PHQ 2/9 Scores  PHQ - 2 Score 0 0 0 0 0 0 1  PHQ- 9 Score     0      Fall Risk    07/11/2022    1:27 PM 06/14/2021    9:08 AM 06/14/2021    8:41 AM 06/13/2020    8:31 AM 03/22/2018   10:07 AM  [place]  in the past year? 0 0 0 0 No  Number falls in past yr: 0 0  0   Injury with Fall? 0 0  0   Risk for fall due to : No Fall Risks   No Fall Risks;Impaired vision   Follow up Falls prevention discussed Falls evaluation completed  Follow up appointment     FALL RISK PREVENTION PERTAINING TO THE HOME:  Any stairs in or around the home? Yes  If so, are there any without handrails? No  Home free of loose throw rugs in walkways, pet beds, electrical cords, etc? Yes  Adequate lighting in your home to reduce risk of falls? Yes   ASSISTIVE DEVICES UTILIZED TO PREVENT FALLS:  Life alert? No  Use of a cane, walker or w/c? No  Grab bars in the bathroom? Yes  Shower chair or bench in shower? Yes  Elevated toilet seat or a handicapped toilet?  TIMED UP AND GO:  Was the test performed? No . Audio Visit  Cognitive Function:        07/11/2022    1:27 PM  6CIT Screen  What Year? 0 points  What month? 0 points  What time? 0 points  Count back from 20 0 points  Months in reverse 0 points  Repeat phrase 0 points  Total Score 0 points    Immunizations Immunization History  Administered Date(s) Administered   Fluad Quad(high Dose 65+) 05/30/2019, 05/07/2020   Hepatitis A, Adult 09/07/2018, 03/09/2019   Influenza, High Dose Seasonal PF 05/26/2016, 06/22/2018   Moderna Sars-Covid-2 Vaccination 08/23/2019, 10/03/2019,  04/02/2020   Pneumococcal Conjugate-13 03/06/2015   Pneumococcal Polysaccharide-23 08/19/2011   Tdap 08/19/2011      Flu Vaccine status: Due, Education has been provided regarding the importance of this vaccine. Advised may receive this vaccine at local pharmacy or Health Dept. Aware to provide a copy of the vaccination record if obtained from local pharmacy or Health Dept. Verbalized acceptance and understanding.  Pneumococcal vaccine status: Up to date  Covid-19 vaccine status: Completed vaccines  Qualifies for Shingles Vaccine? Yes   Zostavax completed No   Shingrix  Completed?: No.    Education has been provided regarding the importance of this vaccine. Patient has been advised to call insurance company to determine out of pocket expense if they have not yet received this vaccine. Advised may also receive vaccine at local pharmacy or Health Dept. Verbalized acceptance and understanding.  Screening Tests Health Maintenance  Topic Date Due   DTaP/Tdap/Td (2 - Td or Tdap) 08/18/2021   COVID-19 Vaccine (4 - 2023-24 season) 07/27/2022 (Originally 04/11/2022)   Zoster Vaccines- Shingrix (1 of 2) 10/10/2022 (Originally 03/18/1954)   INFLUENZA VACCINE  11/09/2022 (Originally 03/11/2022)   Medicare Annual Wellness (AWV)  07/12/2023   Pneumonia Vaccine 66+ Years old  Completed   HPV VACCINES  Aged Out    Health Maintenance  Health Maintenance Due  Topic Date Due   DTaP/Tdap/Td (2 - Td or Tdap) 08/18/2021    Colorectal cancer screening: No longer required.   Lung Cancer Screening: (Low Dose CT Chest recommended if Age 44-80 years, 30 pack-year currently smoking OR have quit w/in 15years.) does not qualify.     Additional Screening:  Hepatitis C Screening: does not qualify; Completed   Vision Screening: Recommended annual ophthalmology exams for early detection of glaucoma and other disorders of the eye. Is the patient up to date with their annual eye exam?  Yes  Who is the provider or what is the name of the office in which the patient attends annual eye exams? Dr Rosana Hoes If pt is not established with a provider, would they like to be referred to a provider to establish care? No .   Dental Screening: Recommended annual dental exams for proper oral hygiene  Community Resource Referral / Chronic Care Management:  CRR required this visit?  No   CCM required this visit?  No      Plan:     I have personally reviewed and noted the following in the patient's chart:   Medical and social history Use of alcohol, tobacco or illicit drugs  Current  medications and supplements including opioid prescriptions. Patient is not currently taking opioid prescriptions. Functional ability and status Nutritional status Physical activity Advanced directives List of other physicians Hospitalizations, surgeries, and ER visits in previous 12 months Vitals Screenings to include cognitive, depression, and falls Referrals and appointments  In addition, I have reviewed and discussed with patient certain preventive protocols, quality metrics, and best practice recommendations. A written personalized care plan for preventive services as well as general preventive health recommendations were provided to patient.     Criselda Peaches, LPN   17/01/1606   Nurse Notes: Patient due Dtap/Tdap/Td(2-Td or Tdap)

## 2022-07-21 ENCOUNTER — Other Ambulatory Visit: Payer: Self-pay | Admitting: Nurse Practitioner

## 2022-07-21 DIAGNOSIS — K754 Autoimmune hepatitis: Secondary | ICD-10-CM

## 2022-07-21 DIAGNOSIS — K7469 Other cirrhosis of liver: Secondary | ICD-10-CM

## 2022-07-23 ENCOUNTER — Ambulatory Visit
Admission: RE | Admit: 2022-07-23 | Discharge: 2022-07-23 | Disposition: A | Discharge status: Home / Self Care | Payer: Medicare Other | Source: Ambulatory Visit | Attending: Nurse Practitioner | Admitting: Nurse Practitioner

## 2022-07-23 DIAGNOSIS — K754 Autoimmune hepatitis: Secondary | ICD-10-CM

## 2022-07-23 DIAGNOSIS — K7469 Other cirrhosis of liver: Secondary | ICD-10-CM

## 2022-07-24 ENCOUNTER — Telehealth: Payer: Self-pay | Admitting: Family Medicine

## 2022-07-25 ENCOUNTER — Encounter: Payer: Self-pay | Admitting: Family Medicine

## 2022-07-25 ENCOUNTER — Ambulatory Visit (INDEPENDENT_AMBULATORY_CARE_PROVIDER_SITE_OTHER): Payer: Medicare Other | Admitting: Family Medicine

## 2022-07-25 VITALS — BP 150/60 | HR 90 | Temp 97.9°F | Ht 68.0 in | Wt 192.2 lb

## 2022-07-25 DIAGNOSIS — I10 Essential (primary) hypertension: Secondary | ICD-10-CM | POA: Diagnosis not present

## 2022-07-25 DIAGNOSIS — Z23 Encounter for immunization: Secondary | ICD-10-CM

## 2022-07-25 DIAGNOSIS — D539 Nutritional anemia, unspecified: Secondary | ICD-10-CM

## 2022-07-25 LAB — TSH: TSH: 3.57 u[IU]/mL (ref 0.35–5.50)

## 2022-07-25 LAB — FOLATE: Folate: 18 ng/mL (ref >5.9)

## 2022-07-25 LAB — VITAMIN B12: Vitamin B-12: 666 pg/mL (ref 211–911)

## 2022-07-25 NOTE — Progress Notes (Signed)
Established Patient Office Visit  Subjective   Patient ID: Ethan Anderson, male    DOB: [birth year]  Age: 87 y.o. MRN: [id number]  Chief Complaint  Patient presents with   Follow-up    HPI   Ethan Anderson is here accompanied by his wife today.  He has history of hypertension, autoimmune hepatitis, macular degeneration, glaucoma, history of gout.  He is followed by hepatologist.  He had recent labs significant for hemoglobin 10.9 with MCV of 102.  His previous CBC was back in June with hemoglobin 11.9 with MCV of 97.  Platelet count with recent labs was normal but has been slightly low in the past.  Looking back over CBC his hemoglobin has been using from the low 11 range since 2020.  He remains on amlodipine 5 mg daily for hypertension.  He takes Imuran for his autoimmune hepatitis.  Lasix was recently reduced to 40 mg once daily.  Also takes potassium supplement.  Generally feels well.  No recent falls.  No appetite or weight changes.  Has not had flu vaccine yet.  Past Medical History:  Diagnosis Date   Arthritis    Cataract    ight eye and has been removed   Colon polyps    Depression    Glaucoma    HTN (hypertension)    Macular degeneration    Past Surgical History:  Procedure Laterality Date   CATARACT EXTRACTION Right    COLONOSCOPY     MULTIPLE TOOTH EXTRACTIONS     pt weard upper & lower dentures   POLYPECTOMY     UPPER GI ENDOSCOPY  09/21/2018    reports that he quit smoking about 31 years ago. His smoking use included cigarettes. He has a 10.00 pack-year smoking history. He has never used smokeless tobacco. He reports that he does not drink alcohol and does not use drugs. family history includes Alcohol abuse in his cousin; Dementia in his father; Other in his grandchild; Ovarian cancer in his mother; Stomach cancer in his mother; Thyroid cancer in his son. No Known Allergies  Review of Systems  Constitutional:  Negative for fever and malaise/fatigue.  Eyes:   Negative for blurred vision.  Respiratory:  Negative for shortness of breath.   Cardiovascular:  Negative for chest pain.  Gastrointestinal:  Negative for abdominal pain.  Neurological:  Negative for dizziness, weakness and headaches.      Objective:     BP (!) 150/60 (BP Location: Left Arm, Patient Position: Sitting, Cuff Size: Normal)   Pulse 90   Temp 97.9 F (36.6 C) (Oral)   Ht '5\' 8"'$  (1.727 m)   Wt 192 lb 3.2 oz (87.2 kg)   SpO2 100%   BMI 29.22 kg/m  BP Readings from Last 3 Encounters:  07/25/22 (!) 150/60  06/14/21 140/80  09/11/20 138/68   Wt Readings from Last 3 Encounters:  07/25/22 192 lb 3.2 oz (87.2 kg)  07/11/22 196 lb (88.9 kg)  06/14/21 195 lb 3.2 oz (88.5 kg)      Physical Exam Vitals reviewed.  Constitutional:      Appearance: He is well-developed.  HENT:     Right Ear: External ear normal.     Left Ear: External ear normal.  Eyes:     Pupils: Pupils are equal, round, and reactive to light.  Neck:     Thyroid: No thyromegaly.  Cardiovascular:     Rate and Rhythm: Normal rate and regular rhythm.  Pulmonary:     Effort:  Pulmonary effort is normal. No respiratory distress.     Breath sounds: Normal breath sounds. No wheezing or rales.  Musculoskeletal:     Cervical back: Neck supple.     Right lower leg: No edema.     Left lower leg: No edema.  Neurological:     Mental Status: He is alert and oriented to person, place, and time.      No results found for any visits on 07/25/22.    The ASCVD Risk score (Arnett DK, et al., 2019) failed to calculate for the following reasons:   The 2019 ASCVD risk score is only valid for ages 76 to 74    Assessment & Plan:   #1 mild macrocytic anemia.  It looks like his hemoglobin is actually fairly stable but just slightly below his baseline for the past few years.  We discussed potential etiologies.  Has not had recent B12 level.  In view of his macrocytosis we will check B12, folate, and TSH.  He does  not use any alcohol. We explained though that most likely cause of mild stable chronic anemia is "anemia of chronic disease ". -Check TSH, B12, folate, and iron studies  #2 hypertension.  Currently treated with amlodipine 5 mg daily.  Blood pressure was up slightly today.  We recommend they monitor closely at home and write down readings and set up 1 month follow-up and bring their cuff in to compare with ours at that time.  If still up at that point consider low-dose thiazide such as chlorthalidone-as he seems to have predominantly isolated systolic hypertension   Return in about 1 month (around 08/25/2022).    Carolann Littler, MD

## 2022-07-25 NOTE — Patient Instructions (Signed)
Monitor blood pressure and record readings and set up one month follow up.   Bring your cuff at follow up.

## 2022-07-26 LAB — IRON,TIBC AND FERRITIN PANEL
%SAT: 32 % (calc) (ref 20–48)
Ferritin: 116 ng/mL (ref 24–380)
Iron: 111 ug/dL (ref 50–180)
TIBC: 344 mcg/dL (calc) (ref 250–425)

## 2022-07-28 NOTE — Addendum Note (Signed)
Addended by: Rosalee Kaufman L on: 07/28/2022 11:00 AM   Modules accepted: Orders

## 2022-07-28 NOTE — Telephone Encounter (Signed)
Error/njr °

## 2022-08-25 ENCOUNTER — Encounter: Payer: Self-pay | Admitting: Family Medicine

## 2022-08-25 ENCOUNTER — Ambulatory Visit (INDEPENDENT_AMBULATORY_CARE_PROVIDER_SITE_OTHER): Payer: Medicare Other | Admitting: Family Medicine

## 2022-08-25 VITALS — BP 130/60 | HR 85 | Temp 98.1°F | Ht 68.0 in | Wt 191.0 lb

## 2022-08-25 DIAGNOSIS — I10 Essential (primary) hypertension: Secondary | ICD-10-CM

## 2022-08-25 NOTE — Progress Notes (Signed)
Established Patient Office Visit  Subjective   Patient ID: Ethan Anderson, male    DOB: 12/08/1934  Age: 88 y.o. MRN: [id number]  Chief Complaint  Patient presents with   Follow-up    HPI   Mr. Flight is seen for follow-up regarding hypertension.  He takes amlodipine 5 mg daily.  Wife brings in manual cuff and automated cuff and several readings.  His systolic readings have been fairly consistently 130s range at home with diastolics 47Q mostly.  No recent headaches.  He is very sedentary.  Did not try to watch his sodium intake.  He denies any dizziness or chest pains.  He had some peripheral edema in the past but none currently.  No alcohol use.  He is very sedentary and gets no regular exercise.  Macrocytic anemia.  We obtain recent labs including B12, folate, TSH and iron studies and these were all normal  Past Medical History:  Diagnosis Date   Arthritis    Cataract    ight eye and has been removed   Colon polyps    Depression    Glaucoma    HTN (hypertension)    Macular degeneration    Past Surgical History:  Procedure Laterality Date   CATARACT EXTRACTION Right    COLONOSCOPY     MULTIPLE TOOTH EXTRACTIONS     pt weard upper & lower dentures   POLYPECTOMY     UPPER GI ENDOSCOPY  09/21/2018    reports that he quit smoking about 32 years ago. His smoking use included cigarettes. He has a 10.00 pack-year smoking history. He has never used smokeless tobacco. He reports that he does not drink alcohol and does not use drugs. family history includes Alcohol abuse in his cousin; Dementia in his father; Other in his grandchild; Ovarian cancer in his mother; Stomach cancer in his mother; Thyroid cancer in his son. No Known Allergies  Review of Systems  Constitutional:  Negative for malaise/fatigue.  Eyes:  Negative for blurred vision.  Respiratory:  Negative for shortness of breath.   Cardiovascular:  Negative for chest pain.  Neurological:  Negative for dizziness, weakness  and headaches.      Objective:     BP 130/60 (BP Location: Left Arm, Patient Position: Sitting, Cuff Size: Normal)   Pulse 85   Temp 98.1 F (36.7 C) (Oral)   Ht '5\' 8"'$  (1.727 m)   Wt 191 lb (86.6 kg)   SpO2 99%   BMI 29.04 kg/m    Physical Exam Vitals reviewed.  Constitutional:      Appearance: He is well-developed.  HENT:     Right Ear: External ear normal.     Left Ear: External ear normal.  Eyes:     Pupils: Pupils are equal, round, and reactive to light.  Neck:     Thyroid: No thyromegaly.  Cardiovascular:     Rate and Rhythm: Normal rate and regular rhythm.  Pulmonary:     Effort: Pulmonary effort is normal. No respiratory distress.     Breath sounds: Normal breath sounds. No wheezing or rales.  Musculoskeletal:     Cervical back: Neck supple.     Right lower leg: No edema.     Left lower leg: No edema.  Neurological:     Mental Status: He is alert and oriented to person, place, and time.      No results found for any visits on 08/25/22.    The ASCVD Risk score (Arnett DK, et al.,  2019) failed to calculate for the following reasons:   The 2019 ASCVD risk score is only valid for ages 12 to 35    Assessment & Plan:   Hypertension.  Improved today.  Repeat reading today 130/60 and this was confirmed with repeat after rest.  They obtained slightly higher reading with her automated cuff with 144/65  -Continue amlodipine 5 mg daily -Continue low-sodium diet -Continue to monitor at least couple times per week and be in touch if consistently greater than 403 systolic  Carolann Littler, MD

## 2022-09-27 ENCOUNTER — Other Ambulatory Visit: Payer: Self-pay | Admitting: Family Medicine

## 2022-10-25 ENCOUNTER — Other Ambulatory Visit: Payer: Self-pay | Admitting: Family Medicine

## 2022-11-24 ENCOUNTER — Ambulatory Visit: Payer: Medicare Other | Admitting: Family Medicine

## 2022-11-25 ENCOUNTER — Encounter: Payer: Self-pay | Admitting: Family

## 2022-11-25 ENCOUNTER — Other Ambulatory Visit: Payer: Self-pay | Admitting: Family

## 2022-11-25 ENCOUNTER — Ambulatory Visit (INDEPENDENT_AMBULATORY_CARE_PROVIDER_SITE_OTHER): Payer: Medicare Other | Admitting: Family

## 2022-11-25 ENCOUNTER — Telehealth: Payer: Self-pay | Admitting: Family Medicine

## 2022-11-25 VITALS — BP 150/60 | HR 91 | Temp 97.8°F | Ht 68.0 in | Wt 193.1 lb

## 2022-11-25 DIAGNOSIS — E876 Hypokalemia: Secondary | ICD-10-CM | POA: Diagnosis not present

## 2022-11-25 DIAGNOSIS — E782 Mixed hyperlipidemia: Secondary | ICD-10-CM

## 2022-11-25 DIAGNOSIS — I10 Essential (primary) hypertension: Secondary | ICD-10-CM | POA: Diagnosis not present

## 2022-11-25 LAB — LIPID PANEL
Cholesterol: 143 mg/dL (ref 0–200)
HDL: 56.9 mg/dL (ref >39.00)
LDL Cholesterol: 67 mg/dL (ref 0–99)
NonHDL: 85.78
Total CHOL/HDL Ratio: 3
Triglycerides: 96 mg/dL (ref 0.0–149.0)
VLDL: 19.2 mg/dL (ref 0.0–40.0)

## 2022-11-25 LAB — HEPATIC FUNCTION PANEL
ALT: 6 U/L (ref 0–53)
AST: 24 U/L (ref 0–37)
Albumin: 3.5 g/dL (ref 3.5–5.2)
Alkaline Phosphatase: 99 U/L (ref 39–117)
Bilirubin, Direct: 0.5 mg/dL — ABNORMAL HIGH (ref 0.0–0.3)
Total Bilirubin: 1.7 mg/dL — ABNORMAL HIGH (ref 0.2–1.2)
Total Protein: 6.8 g/dL (ref 6.0–8.3)

## 2022-11-25 LAB — BASIC METABOLIC PANEL
BUN: 16 mg/dL (ref 6–23)
CO2: 30 mEq/L (ref 19–32)
Calcium: 9 mg/dL (ref 8.4–10.5)
Chloride: 105 mEq/L (ref 96–112)
Creatinine, Ser: 1.01 mg/dL (ref 0.40–1.50)
GFR: 67.73 mL/min (ref >60.00)
Glucose, Bld: 102 mg/dL — ABNORMAL HIGH (ref 70–99)
Potassium: 3.9 mEq/L (ref 3.5–5.1)
Sodium: 139 mEq/L (ref 135–145)

## 2022-11-25 MED ORDER — CHLORTHALIDONE 15 MG PO TABS: 15.0000 mg | ORAL_TABLET | Freq: Every day | ORAL | 1 refills | Status: DC

## 2022-11-25 MED ORDER — CHLORTHALIDONE 25 MG PO TABS
25.0000 mg | ORAL_TABLET | Freq: Every day | ORAL | 0 refills | Status: DC
Start: 2022-11-25 — End: 2023-09-16

## 2022-11-25 NOTE — Telephone Encounter (Signed)
Patient says the pharmacy will not fill chlorthalidone (THALITONE) 15 MG tablet because they do not have the generic. The one they do have a generic for is a stronger dose than the provider wrote for. Requesting a substitution [facility], [place] - [address] Phone: [phone]  Fax: [phone]

## 2022-11-25 NOTE — Progress Notes (Signed)
   Established Patient Office Visit  Subjective   Patient ID: Ethan Anderson, male    DOB: 05-18-1935  Age: 88 y.o. MRN: [id number]  Chief Complaint  Patient presents with  . Medical Management of Chronic Issues    HPI  88 year old African-American male, patient of Dr. Caryl Never presents today for chronic disease management.  He has a history of hypertension, hypokalemia, and hyperlipidemia.  Reports that he is doing well.  Denies any concerns.  Reports diet is good.  He is exercising by working in the yard.  He typically sleeps during the day and is up at night.  That is his normal routine.  Blood pressure continues to trend in the 150s at home.  Diastolic blood pressure is typically in the 60s.  Review of Systems  All other systems reviewed and are negative.    Objective:     BP (!) 150/60 (BP Location: Left Arm, Patient Position: Sitting, Cuff Size: Normal)   Pulse 91   Temp 97.8 F (36.6 C) (Oral)   Ht  (1.727 m)   Wt 193 lb 1.6 oz (87.6 kg)   SpO2 100%   BMI 29.36 kg/m    Physical Exam Vitals and nursing note reviewed.  Constitutional:      Appearance: Normal appearance. He is normal weight.  Cardiovascular:     Rate and Rhythm: Normal rate and regular rhythm.     Comments: Blood pressure on recheck 150/70 Pulmonary:     Effort: Pulmonary effort is normal.     Breath sounds: Normal breath sounds.  Abdominal:     General: Abdomen is flat. Bowel sounds are normal.  Musculoskeletal:        General: Normal range of motion.  Skin:    General: Skin is warm and dry.  Neurological:     General: No focal deficit present.     Mental Status: He is alert and oriented to person, place, and time.  Psychiatric:        Mood and Affect: Mood normal.        Behavior: Behavior normal.    No results found for any visits on 11/25/22.    The ASCVD Risk score (Arnett DK, et al., 2019) failed to calculate for the following reasons:   The 2019 ASCVD risk score is only valid  for ages 59 to 78    Assessment & Plan:   Problem List Items Addressed This Visit     HTN (hypertension) - Primary (Chronic)   Relevant Medications   chlorthalidone (THALITONE) 15 MG tablet   Other Relevant Orders   Basic Metabolic Panel   Hepatic Function Panel   Other Visit Diagnoses     Hypokalemia       Relevant Orders   Basic Metabolic Panel   Mixed hyperlipidemia       Relevant Medications   chlorthalidone (THALITONE) 15 MG tablet   Other Relevant Orders   Basic Metabolic Panel   Hepatic Function Panel   Lipid Panel     Will start chlorthalidone 50 mg once daily to current medication regimen.  Prescription sent to the pharmacy.  Will obtain labs today.  Advised patient to return to see Dr. Caryl Never in 3 months to recheck blood pressure and sooner as needed.  No follow-ups on file.    Eulis Foster, FNP

## 2022-11-26 NOTE — Telephone Encounter (Signed)
Patient's wife informed that rx was sent

## 2022-12-06 ENCOUNTER — Other Ambulatory Visit: Payer: Self-pay | Admitting: Family Medicine

## 2022-12-11 ENCOUNTER — Ambulatory Visit (INDEPENDENT_AMBULATORY_CARE_PROVIDER_SITE_OTHER): Payer: Medicare Other | Admitting: Family Medicine

## 2022-12-11 ENCOUNTER — Encounter: Payer: Self-pay | Admitting: Family Medicine

## 2022-12-11 VITALS — BP 140/52 | HR 86 | Temp 98.1°F | Wt 182.3 lb

## 2022-12-11 DIAGNOSIS — E860 Dehydration: Secondary | ICD-10-CM | POA: Diagnosis not present

## 2022-12-11 DIAGNOSIS — I10 Essential (primary) hypertension: Secondary | ICD-10-CM

## 2022-12-11 DIAGNOSIS — R634 Abnormal weight loss: Secondary | ICD-10-CM

## 2022-12-11 MED ORDER — AMLODIPINE BESYLATE 2.5 MG PO TABS
ORAL_TABLET | ORAL | 1 refills | Status: DC
Start: 2022-12-11 — End: 2023-03-10

## 2022-12-11 MED ORDER — AMLODIPINE BESYLATE 5 MG PO TABS
ORAL_TABLET | ORAL | 1 refills | Status: DC
Start: 2022-12-11 — End: 2023-08-28

## 2022-12-11 NOTE — Patient Instructions (Signed)
Stop taking the chlorthalidone 12.5 mg daily.  A prescription for amlodipine (Norvasc) the medication you were previously on was sent to your pharmacy.  It appears that they do make a 2.5 mg tablet.  I have sent in 2 prescriptions for amlodipine, at 2.5 mg tab as well as a 5 mg tab for a total daily dose of 7.5 mg.  You are to take both of these pills at once daily.  Continue making changes to your diet and rehydrating.  Continue monitoring your blood pressure.

## 2022-12-11 NOTE — Progress Notes (Addendum)
Established Patient Office Visit   Subjective  Patient ID: Ethan Anderson, male    DOB: 20-May-1935  Age: 88 y.o. MRN: [id number]  Chief Complaint  Patient presents with   Allergic Reaction    Started chlorthalidone about 2 wks ago and has been feeling very fatigued since he started it. Ins would only cover the generic brand. Complaining of being hot and gets weak, used to be active around the house now he is only sleeping.    Pt accompanied by his wife.  Patient is an 88 year old male followed by Dr. Caryl Never and seen for acute concern.  Seen 2 weeks ago for elevated BP, 150s systolic.  Norvasc 5 mg daily d/c'd and chlorthalidone 12.5 mg started.  Per patient's wife, since starting the medication patient "not acting himself", no longer running daily or trimming the hedges.  Patient has been complaining of being weak and hot.  Patient's wife encouraged hydration with water and Gatorade.  Also taking Lasix 60 mg daily.  Patient was previously controlled on Norvasc.  Patient notes snacking throughout the day as opposed to eating true meals.  Has a sausage biscuit or other variation of meat on a biscuit for breakfast each morning.  Eating at [employer] and [employer].  Patient's wife inquires about his weight.   Patient Active Problem List   Diagnosis Date Noted   Sepsis due to pneumonia (HCC) 05/28/2019   Macular degeneration    Elevated lactic acid level    Dehydration    CAP (community acquired pneumonia) 05/27/2019   Gallstone 03/23/2019   Autoimmune hepatitis treated with steroids (HCC) 09/22/2018   Transaminasemia 03/22/2018   Positive hepatitis C antibody test 03/22/2018   Chronic iritis 03/02/2018   Gout 10/31/2015   HTN (hypertension) 10/31/2015   Glaucoma 03/06/2015   Social History   Tobacco Use   Smoking status: Former    Packs/day: 1.00    Years: 10.00    Additional pack years: 0.00    Total pack years: 10.00    Types: Cigarettes    Quit date: 08/18/1990    Years since  quitting: 32.3   Smokeless tobacco: Never  Vaping Use   Vaping Use: Never used  Substance Use Topics   Alcohol use: No   Drug use: No   Family History  Problem Relation Age of Onset   Ovarian cancer Mother    Stomach cancer Mother    Dementia Father    Alcohol abuse Cousin    Thyroid cancer Son    Other Grandchild        Primary Sclerosing Cholangitis   Colon cancer Neg Hx    Esophageal cancer Neg Hx    Rectal cancer Neg Hx    Pancreatic cancer Neg Hx    Prostate cancer Neg Hx    No Known Allergies    ROS Negative unless stated above    Objective:     BP (!) 140/52 (BP Location: Left Arm, Patient Position: Sitting, Cuff Size: Normal)   Pulse 86   Temp 98.1 F (36.7 C) (Oral)   Wt 182 lb 4.8 oz (82.7 kg)   SpO2 99%   BMI 27.72 kg/m  BP Readings from Last 3 Encounters:  12/11/22 (!) 140/52  11/25/22 (!) 150/60  08/25/22 130/60   Wt Readings from Last 3 Encounters:  12/11/22 182 lb 4.8 oz (82.7 kg)  11/25/22 193 lb 1.6 oz (87.6 kg)  08/25/22 191 lb (86.6 kg)      Physical Exam Constitutional:  General: He is not in acute distress.    Appearance: Normal appearance.  HENT:     Head: Normocephalic and atraumatic.     Nose: Nose normal.     Mouth/Throat:     Mouth: Mucous membranes are moist.  Cardiovascular:     Rate and Rhythm: Normal rate and regular rhythm.     Heart sounds: Normal heart sounds. No murmur heard.    No gallop.  Pulmonary:     Effort: Pulmonary effort is normal. No respiratory distress.     Breath sounds: Normal breath sounds. No wheezing, rhonchi or rales.  Skin:    General: Skin is warm and dry.  Neurological:     Mental Status: He is alert and oriented to person, place, and time.     No results found for any visits on 12/11/22.    Assessment & Plan:  Essential hypertension -     amLODIPine Besylate; Take 1 tab (5 mg) along with a 2.5 mg tab for a total dose of 7.5 mg daily.  Dispense: 90 tablet; Refill: 1 -      amLODIPine Besylate; Take 1 tab (2.5 mg) along with a 5 mg tablet for total dose of 7.5 mg daily  Dispense: 90 tablet; Refill: 1  Dehydration  Weight loss  Discontinue chlorthalidone 12.5 mg daily 2/2 causing dehydration and likely hypokalemia.  Patient encouraged to increase hydration and potassium intake for the next few days.  Decreasing sodium intake, 2000-2500 mg of salt per day.  Start Norvasc 7.5 mg daily (a 2.5 mg tab and a 5 mg tab).  10+ pound weight loss in 1 month.  Will have patient follow-up in the next few weeks.  For further weight loss obtain labs and imaging.  Return in about 2 weeks (around 12/25/2022), or if symptoms worsen or fail to improve.   Deeann Saint, MD

## 2022-12-25 ENCOUNTER — Ambulatory Visit (INDEPENDENT_AMBULATORY_CARE_PROVIDER_SITE_OTHER): Payer: Medicare Other | Admitting: Family Medicine

## 2022-12-25 ENCOUNTER — Encounter: Payer: Self-pay | Admitting: Family Medicine

## 2022-12-25 VITALS — BP 136/60 | HR 76 | Temp 97.9°F | Wt 192.6 lb

## 2022-12-25 DIAGNOSIS — I10 Essential (primary) hypertension: Secondary | ICD-10-CM | POA: Diagnosis not present

## 2022-12-25 NOTE — Progress Notes (Signed)
Established Patient Office Visit   Subjective  Patient ID: Ethan Anderson, male    DOB: [birth year]  Age: 88 y.o. MRN: [id number]  Chief Complaint  Patient presents with   Follow-up    States seem to be doing better. More energy   Pt accompanied by his wife.  Pt is an 88 yo male with pmh sig for HTN, glaucoma, gout, autoimmune hepatitis who is followed by Dr. Caryl Never and seen for follow-up.  Pt seen 12/13/2022 by this provider, chlorthalidone 25 mg  d/c'd 2/2 dehydration.  Norvasc increased from 5 mg to 7.5 mg daily.  BP at home 143/60s.  Patient notes improvement in energy and feeling better overall since changing medication.  Able to work out in the yard [employer].  Increasing intake of fluids and decreasing sodium intake-no longer having breakfast biscuits at [employer] as frequently.  Denies LE edema, headaches, dizziness.    Past Medical History:  Diagnosis Date   Arthritis    Cataract    ight eye and has been removed   Colon polyps    Depression    Glaucoma    HTN (hypertension)    Macular degeneration    Past Surgical History:  Procedure Laterality Date   CATARACT EXTRACTION Right    COLONOSCOPY     MULTIPLE TOOTH EXTRACTIONS     pt weard upper & lower dentures   POLYPECTOMY     UPPER GI ENDOSCOPY  09/21/2018   Social History   Tobacco Use   Smoking status: Former    Packs/day: 1.00    Years: 10.00    Additional pack years: 0.00    Total pack years: 10.00    Types: Cigarettes    Quit date: 08/18/1990    Years since quitting: 32.3   Smokeless tobacco: Never  Vaping Use   Vaping Use: Never used  Substance Use Topics   Alcohol use: No   Drug use: No   Family History  Problem Relation Age of Onset   Ovarian cancer Mother    Stomach cancer Mother    Dementia Father    Alcohol abuse Cousin    Thyroid cancer Son    Other Grandchild        Primary Sclerosing Cholangitis   Colon cancer Neg Hx    Esophageal cancer Neg Hx    Rectal cancer Neg Hx     Pancreatic cancer Neg Hx    Prostate cancer Neg Hx    No Known Allergies    ROS Negative unless stated above    Objective:     BP 136/60 (BP Location: Right Arm, Patient Position: Sitting, Cuff Size: Normal)   Pulse 76   Temp 97.9 F (36.6 C) (Oral)   Wt 192 lb 9.6 oz (87.4 kg)   SpO2 99%   BMI 29.28 kg/m    Physical Exam Constitutional:      General: He is not in acute distress.    Appearance: Normal appearance.  HENT:     Head: Normocephalic and atraumatic.     Nose: Nose normal.     Mouth/Throat:     Mouth: Mucous membranes are moist.  Cardiovascular:     Rate and Rhythm: Normal rate and regular rhythm.     Heart sounds: Normal heart sounds. No murmur heard.    No gallop.  Pulmonary:     Effort: Pulmonary effort is normal. No respiratory distress.     Breath sounds: Normal breath sounds. No wheezing, rhonchi or rales.  Skin:    General: Skin is warm and dry.  Neurological:     Mental Status: He is alert and oriented to person, place, and time.      No results found for any visits on 12/25/22.    Assessment & Plan:  Essential hypertension  BP controlled and symptoms improved since stopping chlorthalidone.  Continue Norvasc 7.5 mg daily.  Continue monitoring BP at home.  Adjust medications accordingly.  Continue hydration.  Follow-up with PCP in the next few months and for AWV  Deeann Saint, MD

## 2023-01-03 ENCOUNTER — Other Ambulatory Visit: Payer: Self-pay | Admitting: Family Medicine

## 2023-01-21 ENCOUNTER — Other Ambulatory Visit: Payer: Self-pay | Admitting: Nurse Practitioner

## 2023-01-21 DIAGNOSIS — K7469 Other cirrhosis of liver: Secondary | ICD-10-CM

## 2023-01-21 LAB — LAB REPORT - SCANNED: EGFR: 70

## 2023-01-26 ENCOUNTER — Ambulatory Visit
Admission: RE | Admit: 2023-01-26 | Discharge: 2023-01-26 | Disposition: A | Discharge status: Home / Self Care | Payer: Medicare Other | Source: Ambulatory Visit | Attending: Nurse Practitioner | Admitting: Nurse Practitioner

## 2023-01-26 DIAGNOSIS — K7469 Other cirrhosis of liver: Secondary | ICD-10-CM

## 2023-01-30 ENCOUNTER — Other Ambulatory Visit: Payer: Self-pay | Admitting: Family Medicine

## 2023-02-18 ENCOUNTER — Other Ambulatory Visit: Payer: Medicare Other

## 2023-02-21 ENCOUNTER — Other Ambulatory Visit: Payer: Self-pay | Admitting: Family Medicine

## 2023-03-07 ENCOUNTER — Other Ambulatory Visit: Payer: Self-pay | Admitting: Family Medicine

## 2023-03-07 DIAGNOSIS — I10 Essential (primary) hypertension: Secondary | ICD-10-CM

## 2023-04-03 ENCOUNTER — Encounter (INDEPENDENT_AMBULATORY_CARE_PROVIDER_SITE_OTHER): Payer: Medicare Other | Admitting: Ophthalmology

## 2023-04-03 DIAGNOSIS — H353132 Nonexudative age-related macular degeneration, bilateral, intermediate dry stage: Secondary | ICD-10-CM | POA: Diagnosis not present

## 2023-04-03 DIAGNOSIS — H35033 Hypertensive retinopathy, bilateral: Secondary | ICD-10-CM

## 2023-04-03 DIAGNOSIS — I10 Essential (primary) hypertension: Secondary | ICD-10-CM | POA: Diagnosis not present

## 2023-04-03 DIAGNOSIS — H348122 Central retinal vein occlusion, left eye, stable: Secondary | ICD-10-CM

## 2023-04-03 DIAGNOSIS — H43813 Vitreous degeneration, bilateral: Secondary | ICD-10-CM

## 2023-04-04 ENCOUNTER — Other Ambulatory Visit: Payer: Self-pay | Admitting: Family Medicine

## 2023-05-05 ENCOUNTER — Other Ambulatory Visit: Payer: Self-pay | Admitting: Family Medicine

## 2023-06-06 ENCOUNTER — Other Ambulatory Visit: Payer: Self-pay | Admitting: Family Medicine

## 2023-07-04 ENCOUNTER — Other Ambulatory Visit: Payer: Self-pay | Admitting: Family Medicine

## 2023-07-04 DIAGNOSIS — I10 Essential (primary) hypertension: Secondary | ICD-10-CM

## 2023-07-13 ENCOUNTER — Other Ambulatory Visit: Payer: Self-pay | Admitting: Nurse Practitioner

## 2023-07-13 DIAGNOSIS — K7469 Other cirrhosis of liver: Secondary | ICD-10-CM

## 2023-07-13 DIAGNOSIS — K754 Autoimmune hepatitis: Secondary | ICD-10-CM

## 2023-07-15 ENCOUNTER — Ambulatory Visit: Payer: Medicare Other

## 2023-07-15 ENCOUNTER — Ambulatory Visit
Admission: RE | Admit: 2023-07-15 | Discharge: 2023-07-15 | Disposition: A | Discharge status: Home / Self Care | Payer: Medicare Other | Source: Ambulatory Visit | Attending: Nurse Practitioner | Admitting: Nurse Practitioner

## 2023-07-15 VITALS — Ht 68.0 in | Wt 192.0 lb

## 2023-07-15 DIAGNOSIS — Z0000 Encounter for general adult medical examination without abnormal findings: Secondary | ICD-10-CM

## 2023-07-15 DIAGNOSIS — K7469 Other cirrhosis of liver: Secondary | ICD-10-CM

## 2023-07-15 DIAGNOSIS — K754 Autoimmune hepatitis: Secondary | ICD-10-CM

## 2023-07-15 NOTE — Progress Notes (Signed)
Subjective:   Ethan Anderson is a 88 y.o. male who presents for Medicare Annual/Subsequent preventive examination.  Visit Complete: Virtual I connected with  Ariv D Mowrey on 07/15/23 by a audio enabled telemedicine application and verified that I am speaking with the correct person using two identifiers.  Patient Location: Home  Provider Location: Home Office  I discussed the limitations of evaluation and management by telemedicine. The patient expressed understanding and agreed to proceed.  Vital Signs: Because this visit was a virtual/telehealth visit, some criteria may be missing or patient reported. Any vitals not documented were not able to be obtained and vitals that have been documented are patient reported.   Cardiac Risk Factors include: advanced age (>75men, >44 women);male gender;hypertension     Objective:    Today's Vitals   07/15/23 1538  Weight: 192 lb (87.1 kg)  Height: 5\' 8"  (1.727 m)   Body mass index is 29.19 kg/m.     07/15/2023    3:44 PM 07/11/2022    1:27 PM 06/14/2021    9:08 AM 06/13/2020    8:29 AM 05/27/2019    2:52 AM 05/26/2019    5:28 PM  Advanced Directives  Does Patient Have a Medical Advance Directive? No Yes No No Yes No  Type of Special educational needs teacher of [place];Living will   Living will   Does patient want to make changes to medical advance directive?     No - Patient declined   Copy of Healthcare Power of Attorney in Chart?  No - copy requested      Would patient like information on creating a medical advance directive? No - Patient declined  No - Patient declined No - Patient declined No - Patient declined No - Patient declined    Current Medications (verified) Outpatient Encounter Medications as of 07/15/2023  Medication Sig   amLODipine (NORVASC) 2.5 MG tablet TAKE ONE (1) TABLET BY MOUTH EVERY DAY   amLODipine (NORVASC) 5 MG tablet TAKE ONE (1) TABLET EACH DAY   amLODipine (NORVASC) 5 MG tablet Take 1 tab (5 mg)  along with a 2.5 mg tab for a total dose of 7.5 mg daily.   azaTHIOprine (IMURAN) 50 MG tablet Take 100 mg by mouth in the morning, at noon, and at bedtime. Patient is taking all 3 at one time   chlorthalidone (HYGROTON) 25 MG tablet Take 1 tablet (25 mg total) by mouth daily. (Patient not taking: Reported on 12/25/2022)   furosemide (LASIX) 40 MG tablet TAKE 1 AND 1/2 TABLETS DAILY   latanoprost (XALATAN) 0.005 % ophthalmic solution Place 1 drop into the right eye at bedtime.   Multiple Vitamins-Minerals (EYE VITAMINS PO) Take 2 capsules by mouth daily.    potassium chloride (KLOR-CON) 10 MEQ tablet TAKE ONE (1) TABLET EACH DAY   No facility-administered encounter medications on file as of 07/15/2023.    Allergies (verified) Patient has no known allergies.   History: Past Medical History:  Diagnosis Date   Arthritis    Cataract    ight eye and has been removed   Colon polyps    Depression    Glaucoma    HTN (hypertension)    Macular degeneration    Past Surgical History:  Procedure Laterality Date   CATARACT EXTRACTION Right    COLONOSCOPY     MULTIPLE TOOTH EXTRACTIONS     pt weard upper & lower dentures   POLYPECTOMY     UPPER GI ENDOSCOPY  09/21/2018  Family History  Problem Relation Age of Onset   Ovarian cancer Mother    Stomach cancer Mother    Dementia Father    Alcohol abuse Cousin    Thyroid cancer Son    Other Grandchild        Primary Sclerosing Cholangitis   Colon cancer Neg Hx    Esophageal cancer Neg Hx    Rectal cancer Neg Hx    Pancreatic cancer Neg Hx    Prostate cancer Neg Hx    Social History   Socioeconomic History   Marital status: Married    Spouse name: Doris   Number of children: 6   Years of education: Not on file   Highest education level: Not on file  Occupational History   Occupation: retired  Tobacco Use   Smoking status: Former    Current packs/day: 0.00    Average packs/day: 1 pack/day for 10.0 years (10.0 ttl pk-yrs)     Types: Cigarettes    Start date: 08/18/1980    Quit date: 08/18/1990    Years since quitting: 32.9   Smokeless tobacco: Never  Vaping Use   Vaping status: Never Used  Substance and Sexual Activity   Alcohol use: No   Drug use: No   Sexual activity: Not on file  Other Topics Concern   Not on file  Social History Narrative   Not on file   Social Determinants of Health   Financial Resource Strain: Low Risk  (07/15/2023)   Overall Financial Resource Strain (CARDIA)    Difficulty of Paying Living Expenses: Not hard at all  Food Insecurity: No Food Insecurity (07/15/2023)   Hunger Vital Sign    Worried About Running Out of Food in the Last Year: Never true    Ran Out of Food in the Last Year: Never true  Transportation Needs: No Transportation Needs (07/15/2023)   PRAPARE - Administrator, Civil Service (Medical): No    Lack of Transportation (Non-Medical): No  Physical Activity: Inactive (07/15/2023)   Exercise Vital Sign    Days of Exercise per Week: 0 days    Minutes of Exercise per Session: 0 min  Stress: No Stress Concern Present (07/15/2023)   [employer] of Occupational Health - Occupational Stress Questionnaire    Feeling of Stress : Not at all  Social Connections: Socially Integrated (07/15/2023)   Social Connection and Isolation Panel [NHANES]    Frequency of Communication with Friends and Family: More than three times a week    Frequency of Social Gatherings with Friends and Family: More than three times a week    Attends Religious Services: More than 4 times per year    Active Member of [employer] or Organizations: Yes    Attends Engineer, structural: More than 4 times per year    Marital Status: Married    Tobacco Counseling Counseling given: Not Answered   Clinical Intake:  Pre-visit preparation completed: Yes  Pain : No/denies pain     BMI - recorded: 29.19 Nutritional Status: BMI 25 -29 Overweight Nutritional Risks: None Diabetes:  No  How often do you need to have someone help you when you read instructions, pamphlets, or other written materials from your doctor or pharmacy?: 1 - Never  Interpreter Needed?: No  Information entered by :: Theresa Mulligan LPN   Activities of Daily Living    07/15/2023    3:43 PM  In your present state of health, do you have any difficulty performing  the following activities:  Hearing? 0  Vision? 0  Difficulty concentrating or making decisions? 0  Walking or climbing stairs? 0  Dressing or bathing? 0  Doing errands, shopping? 0  Preparing Food and eating ? N  Using the Toilet? N  In the past six months, have you accidently leaked urine? N  Do you have problems with loss of bowel control? N  Managing your Medications? N  Managing your Finances? N  Housekeeping or managing your Housekeeping? N    Patient Care Team: Kristian Covey, MD as PCP - General (Family Medicine)  Indicate any recent Medical Services you may have received from other than [facility] providers in the past year (date may be approximate).     Assessment:   This is a routine wellness examination for [facility].  Hearing/Vision screen Hearing Screening - Comments:: Denies hearing difficulties   Vision Screening - Comments:: Wears rx glasses - up to date with routine eye exams with  Dr Ashley Royalty   Goals Addressed               This Visit's Progress     Stay Active (pt-stated)         Depression Screen    07/15/2023    3:43 PM 12/25/2022   11:56 AM 07/11/2022    1:26 PM 06/14/2021    9:08 AM 06/14/2021    9:06 AM 06/14/2021    8:41 AM 06/13/2020    8:32 AM  PHQ 2/9 Scores  PHQ - 2 Score 0 2 0 0 0 0 0  PHQ- 9 Score  5     0    Fall Risk    07/15/2023    3:44 PM 12/25/2022   11:56 AM 12/11/2022    3:30 PM 07/11/2022    1:27 PM 06/14/2021    9:08 AM  Fall Risk   Falls in the past year? 0 0 0 0 0  Number falls in past yr: 0 0 0 0 0  Injury with Fall? 0 0 0 0 0  Risk for fall due to : No Fall Risks No  Fall Risks No Fall Risks No Fall Risks   Follow up Falls prevention discussed Falls evaluation completed Falls evaluation completed Falls prevention discussed Falls evaluation completed    MEDICARE RISK AT HOME: Medicare Risk at Home Any stairs in or around the home?: Yes If so, are there any without handrails?: No Home free of loose throw rugs in walkways, pet beds, electrical cords, etc?: Yes Adequate lighting in your home to reduce risk of falls?: Yes Life alert?: No Use of a cane, walker or w/c?: No Grab bars in the bathroom?: Yes Shower chair or bench in shower?: No Elevated toilet seat or a handicapped toilet?: Yes  TIMED UP AND GO:  Was the test performed?  No    Cognitive Function:        07/15/2023    3:45 PM 07/11/2022    1:27 PM  6CIT Screen  What Year? 0 points 0 points  What month? 0 points 0 points  What time? 0 points 0 points  Count back from 20 0 points 0 points  Months in reverse 0 points 0 points  Repeat phrase 0 points 0 points  Total Score 0 points 0 points    Immunizations Immunization History  Administered Date(s) Administered   Fluad Quad(high Dose 65+) 05/30/2019, 05/07/2020, 07/25/2022   Hepatitis A, Adult 09/07/2018, 03/09/2019   Influenza, High Dose Seasonal PF 05/26/2016, 06/22/2018  Moderna Sars-Covid-2 Vaccination 08/23/2019, 10/03/2019, 04/02/2020   Pneumococcal Conjugate-13 03/06/2015   Pneumococcal Polysaccharide-23 08/19/2011   Tdap 08/19/2011    TDAP status: Due, Education has been provided regarding the importance of this vaccine. Advised may receive this vaccine at local pharmacy or Health Dept. Aware to provide a copy of the vaccination record if obtained from local pharmacy or Health Dept. Verbalized acceptance and understanding.  Flu Vaccine status: Due, Education has been provided regarding the importance of this vaccine. Advised may receive this vaccine at local pharmacy or Health Dept. Aware to provide a copy of the  vaccination record if obtained from local pharmacy or Health Dept. Verbalized acceptance and understanding.  Pneumococcal vaccine status: Up to date  Covid-19 vaccine status: Declined, Education has been provided regarding the importance of this vaccine but patient still declined. Advised may receive this vaccine at local pharmacy or Health Dept.or vaccine clinic. Aware to provide a copy of the vaccination record if obtained from local pharmacy or Health Dept. Verbalized acceptance and understanding.  Qualifies for Shingles Vaccine? Yes   Zostavax completed No   Shingrix Completed?: No.    Education has been provided regarding the importance of this vaccine. Patient has been advised to call insurance company to determine out of pocket expense if they have not yet received this vaccine. Advised may also receive vaccine at local pharmacy or Health Dept. Verbalized acceptance and understanding.  Screening Tests Health Maintenance  Topic Date Due   Zoster Vaccines- Shingrix (1 of 2) Never done   DTaP/Tdap/Td (2 - Td or Tdap) 08/18/2021   INFLUENZA VACCINE  03/12/2023   COVID-19 Vaccine (4 - 2023-24 season) 04/12/2023   Medicare Annual Wellness (AWV)  07/14/2024   Pneumonia Vaccine 60+ Years old  Completed   HPV VACCINES  Aged Out    Health Maintenance  Health Maintenance Due  Topic Date Due   Zoster Vaccines- Shingrix (1 of 2) Never done   DTaP/Tdap/Td (2 - Td or Tdap) 08/18/2021   INFLUENZA VACCINE  03/12/2023   COVID-19 Vaccine (4 - 2023-24 season) 04/12/2023        Additional Screening:    Vision Screening: Recommended annual ophthalmology exams for early detection of glaucoma and other disorders of the eye. Is the patient up to date with their annual eye exam?  Yes  Who is the provider or what is the name of the office in which the patient attends annual eye exams? Dr Ashley Royalty If pt is not established with a provider, would they like to be referred to a provider to  establish care? No .   Dental Screening: Recommended annual dental exams for proper oral hygiene    Community Resource Referral / Chronic Care Management:  CRR required this visit?  No   CCM required this visit?  No     Plan:     I have personally reviewed and noted the following in the patient's chart:   Medical and social history Use of alcohol, tobacco or illicit drugs  Current medications and supplements including opioid prescriptions. Patient is not currently taking opioid prescriptions. Functional ability and status Nutritional status Physical activity Advanced directives List of other physicians Hospitalizations, surgeries, and ER visits in previous 12 months Vitals Screenings to include cognitive, depression, and falls Referrals and appointments  In addition, I have reviewed and discussed with patient certain preventive protocols, quality metrics, and best practice recommendations. A written personalized care plan for preventive services as well as general preventive health recommendations were provided  to patient.     Tillie Rung, LPN   45/11/979   After Visit Summary: (MyChart) Due to this being a telephonic visit, the after visit summary with patients personalized plan was offered to patient via MyChart   Nurse Notes: None

## 2023-07-15 NOTE — Patient Instructions (Addendum)
Mr. Ethan Anderson , Thank you for taking time to come for your Medicare Wellness Visit. I appreciate your ongoing commitment to your health goals. Please review the following plan we discussed and let me know if I can assist you in the future.   Referrals/Orders/Follow-Ups/Clinician Recommendations:   This is a list of the screening recommended for you and due dates:  Health Maintenance  Topic Date Due   Zoster (Shingles) Vaccine (1 of 2) Never done   DTaP/Tdap/Td vaccine (2 - Td or Tdap) 08/18/2021   Flu Shot  03/12/2023   COVID-19 Vaccine (4 - 2023-24 season) 04/12/2023   Medicare Annual Wellness Visit  07/14/2024   Pneumonia Vaccine  Completed   HPV Vaccine  Aged Out    Advanced directives: (Declined) Advance directive discussed with you today. Even though you declined this today, please call our office should you change your mind, and we can give you the proper paperwork for you to fill out.  Next Medicare Annual Wellness Visit scheduled for next year: Yes

## 2023-07-25 ENCOUNTER — Other Ambulatory Visit: Payer: Self-pay | Admitting: Family Medicine

## 2023-07-28 NOTE — Telephone Encounter (Signed)
I spoke with the patient's wife and she reported she will scheduled 6 month f/u after her upcoming surgery for patient.

## 2023-08-28 ENCOUNTER — Other Ambulatory Visit: Payer: Self-pay | Admitting: Family Medicine

## 2023-08-28 ENCOUNTER — Encounter: Payer: Self-pay | Admitting: Family Medicine

## 2023-08-28 ENCOUNTER — Ambulatory Visit (INDEPENDENT_AMBULATORY_CARE_PROVIDER_SITE_OTHER): Payer: Medicare Other | Admitting: Family Medicine

## 2023-08-28 VITALS — BP 138/68 | HR 95 | Temp 97.6°F | Ht 67.32 in | Wt 190.9 lb

## 2023-08-28 DIAGNOSIS — Z23 Encounter for immunization: Secondary | ICD-10-CM

## 2023-08-28 DIAGNOSIS — R251 Tremor, unspecified: Secondary | ICD-10-CM | POA: Diagnosis not present

## 2023-08-28 DIAGNOSIS — I10 Essential (primary) hypertension: Secondary | ICD-10-CM

## 2023-08-28 DIAGNOSIS — D7589 Other specified diseases of blood and blood-forming organs: Secondary | ICD-10-CM | POA: Diagnosis not present

## 2023-08-28 DIAGNOSIS — D539 Nutritional anemia, unspecified: Secondary | ICD-10-CM | POA: Insufficient documentation

## 2023-08-28 LAB — VITAMIN B12: Vitamin B-12: 553 pg/mL (ref 211–911)

## 2023-08-28 LAB — TSH: TSH: 5.56 u[IU]/mL — ABNORMAL HIGH (ref 0.35–5.50)

## 2023-08-28 NOTE — Progress Notes (Signed)
Established Patient Office Visit  Subjective   Patient ID: Ethan Anderson, male    DOB: [birth year]  Age: 89 y.o. MRN: [id number]  No chief complaint on file.   HPI   Mr Ethan Anderson seen today for medical follow-up.  He has history of hypertension, autoimmune hepatitis, gout, macular degeneration, chronic macrocytic anemia.  Had workup for his anemia back couple years ago with normal B12 and TSH.  Iron studies were normal.  He is followed closely by [facility].  Had recent ultrasound which showed chronic gallstones without acute cholecystitis.  Also comment of complicated cyst left hepatic lobe with recommended ultrasound 6 months.  His lab work was unremarkable.  Last hemoglobin 10.8 with MCV 106.  Family have noticed bilateral upper extremity tremor.  No family history of Parkinson disease.  Does apparently have some intention aspect of his tremor.  No loss of facial expression.  They have not noted any change in gait.  Tremor possibly exacerbated by caffeine.  Past Medical History:  Diagnosis Date   Arthritis    Cataract    ight eye and has been removed   Colon polyps    Depression    Glaucoma    HTN (hypertension)    Macular degeneration    Past Surgical History:  Procedure Laterality Date   CATARACT EXTRACTION Right    COLONOSCOPY     MULTIPLE TOOTH EXTRACTIONS     pt weard upper & lower dentures   POLYPECTOMY     UPPER GI ENDOSCOPY  09/21/2018    reports that he quit smoking about 33 years ago. His smoking use included cigarettes. He started smoking about 43 years ago. He has a 10 pack-year smoking history. He has never used smokeless tobacco. He reports that he does not drink alcohol and does not use drugs. family history includes Alcohol abuse in his cousin; Dementia in his father; Other in his grandchild; Ovarian cancer in his mother; Stomach cancer in his mother; Thyroid cancer in his son. No Known Allergies  Review of Systems  Constitutional:   Negative for chills, fever and malaise/fatigue.  Eyes:  Negative for blurred vision.  Respiratory:  Negative for shortness of breath.   Cardiovascular:  Negative for chest pain and leg swelling.  Neurological:  Positive for tremors. Negative for dizziness, weakness and headaches.      Objective:     BP 138/68   Pulse 95   Temp 97.6 F (36.4 C) (Oral)   Ht 5' 7.32" (1.71 m)   Wt 190 lb 14.4 oz (86.6 kg)   SpO2 98%   BMI 29.61 kg/m  BP Readings from Last 3 Encounters:  08/28/23 138/68  12/25/22 136/60  12/11/22 (!) 140/52   Wt Readings from Last 3 Encounters:  08/28/23 190 lb 14.4 oz (86.6 kg)  07/15/23 192 lb (87.1 kg)  12/25/22 192 lb 9.6 oz (87.4 kg)      Physical Exam Vitals reviewed.  Constitutional:      Appearance: He is well-developed.  HENT:     Right Ear: External ear normal.     Left Ear: External ear normal.  Eyes:     Pupils: Pupils are equal, round, and reactive to light.  Neck:     Thyroid: No thyromegaly.  Cardiovascular:     Rate and Rhythm: Normal rate and regular rhythm.  Pulmonary:     Effort: Pulmonary effort is normal. No respiratory distress.     Breath sounds: Normal breath sounds. No  wheezing or rales.  Musculoskeletal:     Cervical back: Neck supple.     Right lower leg: No edema.     Left lower leg: No edema.  Neurological:     Mental Status: He is alert and oriented to person, place, and time.     Cranial Nerves: No cranial nerve deficit.     Motor: No weakness.     Comments: He does have bilateral upper extremity tremor which does not extinguish with movement.  No cogwheel rigidity.      No results found for any visits on 08/28/23.  Last CBC Lab Results  Component Value Date   WBC 4.7 06/14/2021   HGB 11.6 (L) 06/14/2021   HCT 32.9 (L) 06/14/2021   MCV 104.0 (H) 06/14/2021   MCH 31.8 05/29/2019   RDW 18.3 (H) 06/14/2021   PLT 138.0 (L) 06/14/2021   Last metabolic panel Lab Results  Component Value Date   GLUCOSE  102 (H) 11/25/2022   NA 139 11/25/2022   K 3.9 11/25/2022   CL 105 11/25/2022   CO2 30 11/25/2022   BUN 16 11/25/2022   CREATININE 1.01 11/25/2022   EGFR 70.0 01/21/2023   CALCIUM 9.0 11/25/2022   PROT 6.8 11/25/2022   ALBUMIN 3.5 11/25/2022   BILITOT 1.7 (H) 11/25/2022   ALKPHOS 99 11/25/2022   AST 24 11/25/2022   ALT 6 11/25/2022   ANIONGAP 5 05/30/2019   Last lipids Lab Results  Component Value Date   CHOL 143 11/25/2022   HDL 56.90 11/25/2022   LDLCALC 67 11/25/2022   TRIG 96.0 11/25/2022   CHOLHDL 3 11/25/2022   Last thyroid functions Lab Results  Component Value Date   TSH 3.57 07/25/2022      The ASCVD Risk score (Arnett DK, et al., 2019) failed to calculate for the following reasons:   The 2019 ASCVD risk score is only valid for ages 78 to 28    Assessment & Plan:   #1 hypertension.  Currently treated with amlodipine 5 mg daily and chlorthalidone 25 mg daily.  Recent electrolytes stable.  Renal function stable.  Continue low-sodium diet.  Initial blood pressure up today but improved after repeat reading  #2 bilateral upper extremity tremor.  Does not extinguish with movement.  Suspect essential tremor.  Gait normal.  No muscle rigidity.  Reviewed signs and symptoms of Parkinson's disease.  Try avoid caffeine.  Could consider potential therapies for tremor including beta-blockers but would observe for now since symptoms are mild  #3 history of chronic macrocytic anemia.  Previous evaluation normal B12 and TSH.  Previous iron studies normal.  Recheck TSH and B12 today.  He does not use any alcohol.   No follow-ups on file.    Evelena Peat, MD

## 2023-09-14 ENCOUNTER — Encounter: Payer: Self-pay | Admitting: Family Medicine

## 2023-09-16 ENCOUNTER — Encounter: Payer: Self-pay | Admitting: Family Medicine

## 2023-09-16 ENCOUNTER — Telehealth: Payer: Self-pay

## 2023-09-16 ENCOUNTER — Ambulatory Visit (INDEPENDENT_AMBULATORY_CARE_PROVIDER_SITE_OTHER): Payer: Medicare Other | Admitting: Family Medicine

## 2023-09-16 VITALS — BP 142/68 | HR 88 | Temp 97.7°F | Wt 184.6 lb

## 2023-09-16 DIAGNOSIS — I10 Essential (primary) hypertension: Secondary | ICD-10-CM | POA: Diagnosis not present

## 2023-09-16 DIAGNOSIS — E876 Hypokalemia: Secondary | ICD-10-CM

## 2023-09-16 DIAGNOSIS — R531 Weakness: Secondary | ICD-10-CM | POA: Diagnosis not present

## 2023-09-16 DIAGNOSIS — R410 Disorientation, unspecified: Secondary | ICD-10-CM

## 2023-09-16 LAB — BASIC METABOLIC PANEL
BUN: 24 mg/dL — ABNORMAL HIGH (ref 6–23)
CO2: 41 meq/L — ABNORMAL HIGH (ref 19–32)
Calcium: 9.4 mg/dL (ref 8.4–10.5)
Chloride: 87 meq/L — ABNORMAL LOW (ref 96–112)
Creatinine, Ser: 1.34 mg/dL (ref 0.40–1.50)
GFR: 47.97 mL/min — ABNORMAL LOW (ref >60.00)
Glucose, Bld: 103 mg/dL — ABNORMAL HIGH (ref 70–99)
Potassium: 2.8 meq/L — CL (ref 3.5–5.1)
Sodium: 135 meq/L (ref 135–145)

## 2023-09-16 MED ORDER — POTASSIUM CHLORIDE CRYS ER 20 MEQ PO TBCR: EXTENDED_RELEASE_TABLET | ORAL | 0 refills | Status: DC

## 2023-09-16 MED ORDER — OLMESARTAN MEDOXOMIL 5 MG PO TABS: 5.0000 mg | ORAL_TABLET | Freq: Every day | ORAL | 3 refills | Status: DC

## 2023-09-16 NOTE — Progress Notes (Signed)
 Established Patient Office Visit  Subjective   Patient ID: Ethan Anderson, male    DOB: 05/19/1935  Age: 89 y.o. MRN: [id number]  No chief complaint on file.   HPI   Mr. Kauffmann has history of hypertension, autoimmune hepatitis, glaucoma, gout.  We had recommended follow-up as they had called recently stating that he was having some generalized weakness and transient confusion.  No fever.  Complained of some dizziness last week and wife thought this may be secondary to chlorthalidone  and she stopped his chlorthalidone .  He remains on amlodipine  5 mg daily.  Also takes Lasix  40 mg 1-1/2 tablets daily.  She states last week he had several days where he seemed little more off cognitively.  Several times he got up to walk to the door and then stopped it did not seem to know what to do.  Possibly some mild slurring of speech but wife not sure.  No focal weakness.  They discontinued his chlorthalidone  about a week ago.  We had suggested follow-up for some electrolytes and further evaluation.  Denies any dysuria, cough, chest pain.  Past Medical History:  Diagnosis Date   Arthritis    Cataract    ight eye and has been removed   Colon polyps    Depression    Glaucoma    HTN (hypertension)    Macular degeneration    Past Surgical History:  Procedure Laterality Date   CATARACT EXTRACTION Right    COLONOSCOPY     MULTIPLE TOOTH EXTRACTIONS     pt weard upper & lower dentures   POLYPECTOMY     UPPER GI ENDOSCOPY  09/21/2018    reports that he quit smoking about 33 years ago. His smoking use included cigarettes. He started smoking about 43 years ago. He has a 10 pack-year smoking history. He has never used smokeless tobacco. He reports that he does not drink alcohol and does not use drugs. family history includes Alcohol abuse in his cousin; Dementia in his father; Other in his grandchild; Ovarian cancer in his mother; Stomach cancer in his mother; Thyroid  cancer in his son. No Known  Allergies  Review of Systems  Constitutional:  Negative for chills and fever.  Respiratory:  Negative for cough and shortness of breath.   Cardiovascular:  Negative for chest pain and leg swelling.  Genitourinary:  Negative for dysuria and frequency.  Neurological:  Negative for focal weakness, seizures, loss of consciousness and headaches.  Psychiatric/Behavioral:  Negative for hallucinations.       Objective:     BP (!) 142/68 (BP Location: Left Arm, Cuff Size: Normal)   Pulse 88   Temp 97.7 F (36.5 C) (Oral)   Wt 184 lb 9.6 oz (83.7 kg)   SpO2 99%   BMI 28.64 kg/m  BP Readings from Last 3 Encounters:  09/16/23 (!) 142/68  08/28/23 138/68  12/25/22 136/60   Wt Readings from Last 3 Encounters:  09/16/23 184 lb 9.6 oz (83.7 kg)  08/28/23 190 lb 14.4 oz (86.6 kg)  07/15/23 192 lb (87.1 kg)      Physical Exam Vitals reviewed.  Constitutional:      General: He is not in acute distress.    Appearance: He is not ill-appearing.  Cardiovascular:     Rate and Rhythm: Normal rate and regular rhythm.  Pulmonary:     Effort: Pulmonary effort is normal.     Breath sounds: Normal breath sounds.  Musculoskeletal:     Cervical back: Neck  supple.     Right lower leg: No edema.     Left lower leg: No edema.  Neurological:     General: No focal deficit present.     Mental Status: He is alert.     Cranial Nerves: No cranial nerve deficit.     Motor: No weakness.     Comments: No focal weakness.  Cranial nerves II through XII intact.  Oriented to time in terms of the the week, year, month but not date.      No results found for any visits on 09/16/23.  Last CBC Lab Results  Component Value Date   WBC 4.7 06/14/2021   HGB 11.6 (L) 06/14/2021   HCT 32.9 (L) 06/14/2021   MCV 104.0 (H) 06/14/2021   MCH 31.8 05/29/2019   RDW 18.3 (H) 06/14/2021   PLT 138.0 (L) 06/14/2021   Last metabolic panel Lab Results  Component Value Date   GLUCOSE 102 (H) 11/25/2022   NA 139  11/25/2022   K 3.9 11/25/2022   CL 105 11/25/2022   CO2 30 11/25/2022   BUN 16 11/25/2022   CREATININE 1.01 11/25/2022   EGFR 70.0 01/21/2023   CALCIUM 9.0 11/25/2022   PROT 6.8 11/25/2022   ALBUMIN 3.5 11/25/2022   BILITOT 1.7 (H) 11/25/2022   ALKPHOS 99 11/25/2022   AST 24 11/25/2022   ALT 6 11/25/2022   ANIONGAP 5 05/30/2019   Last lipids Lab Results  Component Value Date   CHOL 143 11/25/2022   HDL 56.90 11/25/2022   LDLCALC 67 11/25/2022   TRIG 96.0 11/25/2022   CHOLHDL 3 11/25/2022   Last thyroid  functions Lab Results  Component Value Date   TSH 5.56 (H) 08/28/2023   Last vitamin B12 and Folate Lab Results  Component Value Date   VITAMINB12 553 08/28/2023   FOLATE 18.0 07/25/2022      The ASCVD Risk score (Arnett DK, et al., 2019) failed to calculate for the following reasons:   The 2019 ASCVD risk score is only valid for ages 86 to 54    Assessment & Plan:   #1 hypertension.  Suboptimally controlled.  Wife stopped his chlorthalidone  last week.  They had concerns about possible side effects. Check basic metabolic panel.  Continue amlodipine  5 mg daily and add Benicar  5 mg daily.  Set up office follow-up in about 3 weeks to reassess.  Keep sodium intake less than 2000 mg daily.  #2 question of recent transient confusion.  Nonfocal exam at this time.  Wife states this lasted for several days.  They were concerned this may be related to his medication.  Wife did get electrolyte replacement with Gatorade and after a day or so that he seemed to be closer to baseline.  Check basic metabolic panel as above.  Set up MRI brain to further assess Recent B12 normal.  TSH 5.5   Return in about 1 month (around 10/14/2023).    Wolm Scarlet, MD

## 2023-09-16 NOTE — Addendum Note (Signed)
Addended by: Christy Sartorius on: 09/16/2023 05:23 PM   Modules accepted: Orders

## 2023-09-16 NOTE — Telephone Encounter (Signed)
 CRITICAL VALUE STICKER  CRITICAL VALUE: Potassium 2.82  RECEIVER (on-site recipient of call): Scharlene Catalina  DATE & TIME NOTIFIED:  3:25 pm  MESSENGER (representative from lab): Saa  MD NOTIFIED:  Via epic  TIME OF NOTIFICATION: 3:25 pm  RESPONSE:  N/A

## 2023-09-16 NOTE — Telephone Encounter (Signed)
Patient's wife informed of the message below and voiced understanding. RX sent and labs placed.

## 2023-09-16 NOTE — Patient Instructions (Signed)
 HOLD Chlorthalidone   Continue the Amlodipine  and start the Olmesartan .  I am setting up MRI brain to further assess.   Set up one month follow up

## 2023-09-19 ENCOUNTER — Emergency Department (HOSPITAL_COMMUNITY): Payer: Medicare Other

## 2023-09-19 ENCOUNTER — Emergency Department (HOSPITAL_COMMUNITY)
Admission: EM | Admit: 2023-09-19 | Discharge: 2023-09-19 | Disposition: A | Discharge status: Home / Self Care | Payer: Medicare Other | Attending: Emergency Medicine | Admitting: Emergency Medicine

## 2023-09-19 ENCOUNTER — Encounter (HOSPITAL_COMMUNITY): Payer: Self-pay

## 2023-09-19 ENCOUNTER — Other Ambulatory Visit: Payer: Self-pay

## 2023-09-19 DIAGNOSIS — R1013 Epigastric pain: Secondary | ICD-10-CM | POA: Diagnosis not present

## 2023-09-19 DIAGNOSIS — I700 Atherosclerosis of aorta: Secondary | ICD-10-CM | POA: Insufficient documentation

## 2023-09-19 DIAGNOSIS — I10 Essential (primary) hypertension: Secondary | ICD-10-CM | POA: Diagnosis not present

## 2023-09-19 DIAGNOSIS — I81 Portal vein thrombosis: Secondary | ICD-10-CM | POA: Insufficient documentation

## 2023-09-19 DIAGNOSIS — R7989 Other specified abnormal findings of blood chemistry: Secondary | ICD-10-CM

## 2023-09-19 DIAGNOSIS — E7220 Disorder of urea cycle metabolism, unspecified: Secondary | ICD-10-CM | POA: Diagnosis not present

## 2023-09-19 DIAGNOSIS — R410 Disorientation, unspecified: Secondary | ICD-10-CM | POA: Diagnosis not present

## 2023-09-19 DIAGNOSIS — Z87891 Personal history of nicotine dependence: Secondary | ICD-10-CM | POA: Diagnosis not present

## 2023-09-19 DIAGNOSIS — R42 Dizziness and giddiness: Secondary | ICD-10-CM | POA: Diagnosis present

## 2023-09-19 DIAGNOSIS — K8020 Calculus of gallbladder without cholecystitis without obstruction: Secondary | ICD-10-CM | POA: Insufficient documentation

## 2023-09-19 DIAGNOSIS — K7460 Unspecified cirrhosis of liver: Secondary | ICD-10-CM | POA: Diagnosis not present

## 2023-09-19 DIAGNOSIS — Z20822 Contact with and (suspected) exposure to covid-19: Secondary | ICD-10-CM | POA: Insufficient documentation

## 2023-09-19 DIAGNOSIS — R110 Nausea: Secondary | ICD-10-CM | POA: Insufficient documentation

## 2023-09-19 DIAGNOSIS — Z79899 Other long term (current) drug therapy: Secondary | ICD-10-CM | POA: Insufficient documentation

## 2023-09-19 DIAGNOSIS — K754 Autoimmune hepatitis: Secondary | ICD-10-CM | POA: Insufficient documentation

## 2023-09-19 LAB — CBC WITH DIFFERENTIAL/PLATELET
Abs Immature Granulocytes: 0.02 10*3/uL (ref 0.00–0.07)
Basophils Absolute: 0 10*3/uL (ref 0.0–0.1)
Basophils Relative: 1 %
Eosinophils Absolute: 0 10*3/uL (ref 0.0–0.5)
Eosinophils Relative: 1 %
HCT: 30.7 % — ABNORMAL LOW (ref 39.0–52.0)
Hemoglobin: 11 g/dL — ABNORMAL LOW (ref 13.0–17.0)
Immature Granulocytes: 0 %
Lymphocytes Relative: 8 %
Lymphs Abs: 0.4 10*3/uL — ABNORMAL LOW (ref 0.7–4.0)
MCH: 37.9 pg — ABNORMAL HIGH (ref 26.0–34.0)
MCHC: 35.8 g/dL (ref 30.0–36.0)
MCV: 105.9 fL — ABNORMAL HIGH (ref 80.0–100.0)
Monocytes Absolute: 0.2 10*3/uL (ref 0.1–1.0)
Monocytes Relative: 3 %
Neutro Abs: 4.6 10*3/uL (ref 1.7–7.7)
Neutrophils Relative %: 87 %
Platelets: 114 10*3/uL — ABNORMAL LOW (ref 150–400)
RBC: 2.9 MIL/uL — ABNORMAL LOW (ref 4.22–5.81)
RDW: 15.8 % — ABNORMAL HIGH (ref 11.5–15.5)
WBC: 5.3 10*3/uL (ref 4.0–10.5)
nRBC: 0 % (ref 0.0–0.2)

## 2023-09-19 LAB — COMPREHENSIVE METABOLIC PANEL
ALT: 24 U/L (ref 0–44)
AST: 59 U/L — ABNORMAL HIGH (ref 15–41)
Albumin: 3.4 g/dL — ABNORMAL LOW (ref 3.5–5.0)
Alkaline Phosphatase: 92 U/L (ref 38–126)
Anion gap: 12 (ref 5–15)
BUN: 20 mg/dL (ref 8–23)
CO2: 29 mmol/L (ref 22–32)
Calcium: 9 mg/dL (ref 8.9–10.3)
Chloride: 98 mmol/L (ref 98–111)
Creatinine, Ser: 1.28 mg/dL — ABNORMAL HIGH (ref 0.61–1.24)
GFR, Estimated: 55 mL/min — ABNORMAL LOW (ref >60)
Glucose, Bld: 152 mg/dL — ABNORMAL HIGH (ref 70–99)
Potassium: 3.6 mmol/L (ref 3.5–5.1)
Sodium: 139 mmol/L (ref 135–145)
Total Bilirubin: 3 mg/dL — ABNORMAL HIGH (ref 0.0–1.2)
Total Protein: 7.1 g/dL (ref 6.5–8.1)

## 2023-09-19 LAB — URINALYSIS, ROUTINE W REFLEX MICROSCOPIC
Bilirubin Urine: NEGATIVE
Glucose, UA: NEGATIVE mg/dL
Hgb urine dipstick: NEGATIVE
Ketones, ur: NEGATIVE mg/dL
Leukocytes,Ua: NEGATIVE
Nitrite: NEGATIVE
Protein, ur: NEGATIVE mg/dL
Specific Gravity, Urine: 1.014 (ref 1.005–1.030)
pH: 6 (ref 5.0–8.0)

## 2023-09-19 LAB — TROPONIN I (HIGH SENSITIVITY)
Troponin I (High Sensitivity): 9 ng/L (ref <18)
Troponin I (High Sensitivity): 8 ng/L (ref <18)

## 2023-09-19 LAB — LIPASE, BLOOD: Lipase: 42 U/L (ref 11–51)

## 2023-09-19 LAB — RESP PANEL BY RT-PCR (RSV, FLU A&B, COVID)  RVPGX2
Influenza A by PCR: NEGATIVE
Influenza B by PCR: NEGATIVE
Resp Syncytial Virus by PCR: NEGATIVE
SARS Coronavirus 2 by RT PCR: NEGATIVE

## 2023-09-19 LAB — MAGNESIUM: Magnesium: 2.2 mg/dL (ref 1.7–2.4)

## 2023-09-19 LAB — AMMONIA: Ammonia: 74 umol/L — ABNORMAL HIGH (ref 9–35)

## 2023-09-19 MED ORDER — SODIUM CHLORIDE 0.9 % IV BOLUS
1000.0000 mL | Freq: Once | INTRAVENOUS | Status: AC
  Administered 2023-09-19: 1000 mL via INTRAVENOUS

## 2023-09-19 MED ORDER — LACTULOSE 10 GM/15ML PO SOLN: 30.0000 g | Freq: Three times a day (TID) | ORAL | 0 refills | Status: DC

## 2023-09-19 MED ORDER — MECLIZINE HCL 12.5 MG PO TABS
25.0000 mg | ORAL_TABLET | Freq: Once | ORAL | Status: AC
Start: 2023-09-19 — End: 2023-09-19
  Administered 2023-09-19: 25 mg via ORAL
  Filled 2023-09-19: qty 2

## 2023-09-19 MED ORDER — ONDANSETRON HCL 4 MG PO TABS
4.0000 mg | ORAL_TABLET | Freq: Once | ORAL | Status: AC
  Administered 2023-09-19: 4 mg via ORAL
  Filled 2023-09-19: qty 1

## 2023-09-19 MED ORDER — IOHEXOL 300 MG/ML  SOLN
100.0000 mL | Freq: Once | INTRAMUSCULAR | Status: AC | PRN
  Administered 2023-09-19: 100 mL via INTRAVENOUS

## 2023-09-19 MED ORDER — MECLIZINE HCL 12.5 MG PO TABS: 12.5000 mg | ORAL_TABLET | Freq: Two times a day (BID) | ORAL | 0 refills | Status: AC | PRN

## 2023-09-19 MED ORDER — SUCRALFATE 1 G PO TABS: 1.0000 g | ORAL_TABLET | Freq: Three times a day (TID) | ORAL | 0 refills | Status: DC

## 2023-09-19 NOTE — ED Provider Notes (Signed)
  EMERGENCY DEPARTMENT AT [facility] Provider Note  CSN: [id number] Arrival date & time: 09/19/23 1457  Chief Complaint(s) Nausea  HPI KIN GALBRAITH is a 89 y.o. male with past medical history as below, significant for depression, HTN, glaucoma, arthritis, cirrhosis who presents to the ED with complaint of nausea/ abd pain/ near syncope.   Reports has been feeling unwell for approximately 2 weeks, intermittent dizziness/ribs may sensation, moving more slowly compared to his baseline per family.  They are seen by PCP, found to have low potassium is also been scheduled for outpatient MRI to rule out possible stroke.  Family reports these symptoms have somewhat improved, still having dizziness.  Intermittent confusion per family. Patient reports when he moves from a seated to a standing position he feels the room is spinning, when he stands up quickly or turns his head quickly he feels a spinning sensation.  He is not currently dizzy.  Reports around 1 PM today he had a cramping/tightness sensation to his epigastrium associated nausea, felt lightheaded.  Symptoms lasted for approximately 30 minutes and resolved spontaneously.  He is currently asymptomatic.  Denies similar symptoms in the past.   Past Medical History Past Medical History:  Diagnosis Date   Arthritis    Cataract    ight eye and has been removed   Colon polyps    Depression    Glaucoma    HTN (hypertension)    Macular degeneration    Patient Active Problem List   Diagnosis Date Noted   Macrocytic anemia 08/28/2023   Sepsis due to pneumonia (HCC) 05/28/2019   Macular degeneration    Elevated lactic acid level    Dehydration    CAP (community acquired pneumonia) 05/27/2019   Gallstone 03/23/2019   Autoimmune hepatitis treated with steroids (HCC) 09/22/2018   Transaminasemia 03/22/2018   Positive hepatitis C antibody test 03/22/2018   Chronic iritis 03/02/2018   Gout 10/31/2015   HTN (hypertension)  10/31/2015   Glaucoma 03/06/2015   Home Medication(s) Prior to Admission medications   Medication Sig Start Date End Date Taking? Authorizing Provider  lactulose  (CHRONULAC ) 10 GM/15ML solution Take 45 mLs (30 g total) by mouth 3 (three) times daily. 09/19/23  Yes Elnor Savant A, DO  meclizine  (ANTIVERT ) 12.5 MG tablet Take 1 tablet (12.5 mg total) by mouth 2 (two) times daily as needed for dizziness. 09/19/23  Yes Elnor Savant A, DO  sucralfate  (CARAFATE ) 1 g tablet Take 1 tablet (1 g total) by mouth with breakfast, with lunch, and with evening meal for 7 days. 09/19/23 09/26/23 Yes Elnor Savant A, DO  amLODipine  (NORVASC ) 5 MG tablet TAKE ONE (1) TABLET EACH DAY 07/28/23   Burchette, Wolm ORN, MD  azaTHIOprine  (IMURAN ) 50 MG tablet Take 100 mg by mouth in the morning, at noon, and at bedtime. Patient is taking all 3 at one time    [provider]  furosemide  (LASIX ) 40 MG tablet TAKE 1 AND 1/2 TABLETS DAILY 07/06/23   Burchette, Wolm ORN, MD  latanoprost (XALATAN) 0.005 % ophthalmic solution Place 1 drop into the right eye at bedtime. 03/12/20   [provider]  Multiple Vitamins-Minerals (EYE VITAMINS PO) Take 2 capsules by mouth daily.     [provider]  olmesartan  (BENICAR ) 5 MG tablet Take 1 tablet (5 mg total) by mouth daily. 09/16/23   Burchette, Wolm ORN, MD  potassium chloride  (KLOR-CON ) 10 MEQ tablet TAKE ONE (1) TABLET EACH DAY 08/31/23   Burchette, Wolm ORN,  MD  potassium chloride  SA (KLOR-CON  M) 20 MEQ tablet Take two tablets per day for 3 days and then one daily. 09/16/23   Burchette, Wolm ORN, MD                                                                                                                                    Past Surgical History Past Surgical History:  Procedure Laterality Date   CATARACT EXTRACTION Right    COLONOSCOPY     MULTIPLE TOOTH EXTRACTIONS     pt weard upper & lower dentures   POLYPECTOMY     UPPER GI ENDOSCOPY  09/21/2018   Family  History Family History  Problem Relation Age of Onset   Ovarian cancer Mother    Stomach cancer Mother    Dementia Father    Alcohol abuse Cousin    Thyroid  cancer Son    Other Grandchild        Primary Sclerosing Cholangitis   Colon cancer Neg Hx    Esophageal cancer Neg Hx    Rectal cancer Neg Hx    Pancreatic cancer Neg Hx    Prostate cancer Neg Hx     Social History Social History   Tobacco Use   Smoking status: Former    Current packs/day: 0.00    Average packs/day: 1 pack/day for 10.0 years (10.0 ttl pk-yrs)    Types: Cigarettes    Start date: 08/18/1980    Quit date: 08/18/1990    Years since quitting: 33.1   Smokeless tobacco: Never  Vaping Use   Vaping status: Never Used  Substance Use Topics   Alcohol use: No   Drug use: No   Allergies Patient has no known allergies.  Review of Systems Review of Systems  Constitutional:  Negative for chills and fever.  HENT:  Negative for congestion.   Respiratory:  Negative for chest tightness and shortness of breath.   Cardiovascular:  Negative for chest pain and palpitations.  Gastrointestinal:  Positive for abdominal pain and nausea. Negative for vomiting.  Genitourinary:  Negative for difficulty urinating.  Musculoskeletal:  Negative for arthralgias.  Skin:  Negative for rash.  Neurological:  Positive for dizziness and light-headedness.  Psychiatric/Behavioral:  Positive for confusion.   All other systems reviewed and are negative.   Physical Exam Vital Signs  I have reviewed the triage vital signs BP (!) 148/67   Pulse 92   Temp 97.8 F (36.6 C) (Oral)   Resp 20   Ht 5' 7 (1.702 m)   Wt 91.2 kg   SpO2 97%   BMI 31.48 kg/m  Physical Exam Vitals and nursing note reviewed.  Constitutional:      General: He is not in acute distress.    Appearance: He is well-developed.  HENT:     Head: Normocephalic and atraumatic.     Right Ear: External ear normal.     Left Ear: External  ear normal.      Mouth/Throat:     Mouth: Mucous membranes are moist.  Eyes:     General: No scleral icterus. Cardiovascular:     Rate and Rhythm: Normal rate and regular rhythm.     Pulses: Normal pulses.     Heart sounds: Normal heart sounds.  Pulmonary:     Effort: Pulmonary effort is normal. No respiratory distress.     Breath sounds: Normal breath sounds.  Abdominal:     General: Abdomen is flat.     Palpations: Abdomen is soft.     Tenderness: There is no abdominal tenderness.  Musculoskeletal:     Cervical back: No rigidity.     Right lower leg: No edema.     Left lower leg: No edema.  Skin:    General: Skin is warm and dry.     Capillary Refill: Capillary refill takes less than 2 seconds.  Neurological:     Mental Status: He is alert.  Psychiatric:        Mood and Affect: Mood normal.        Behavior: Behavior normal.     ED Results and Treatments Labs (all labs ordered are listed, but only abnormal results are displayed) Labs Reviewed  CBC WITH DIFFERENTIAL/PLATELET - Abnormal; Notable for the following components:      Result Value   RBC 2.90 (*)    Hemoglobin 11.0 (*)    HCT 30.7 (*)    MCV 105.9 (*)    MCH 37.9 (*)    RDW 15.8 (*)    Platelets 114 (*)    Lymphs Abs 0.4 (*)    All other components within normal limits  COMPREHENSIVE METABOLIC PANEL - Abnormal; Notable for the following components:   Glucose, Bld 152 (*)    Creatinine, Ser 1.28 (*)    Albumin 3.4 (*)    AST 59 (*)    Total Bilirubin 3.0 (*)    GFR, Estimated 55 (*)    All other components within normal limits  URINALYSIS, ROUTINE W REFLEX MICROSCOPIC - Abnormal; Notable for the following components:   Color, Urine AMBER (*)    All other components within normal limits  AMMONIA - Abnormal; Notable for the following components:   Ammonia 74 (*)    All other components within normal limits  RESP PANEL BY RT-PCR (RSV, FLU A&B, COVID)  RVPGX2  LIPASE, BLOOD  MAGNESIUM  TROPONIN I (HIGH SENSITIVITY)   TROPONIN I (HIGH SENSITIVITY)                                                                                                                          Radiology CT ABDOMEN PELVIS W CONTRAST Result Date: 09/19/2023 CLINICAL DATA:  Syncope, epigastric pain, left upper quadrant pain EXAM: CT ABDOMEN AND PELVIS WITH CONTRAST TECHNIQUE: Multidetector CT imaging of the abdomen and pelvis was performed using the standard protocol following bolus administration of intravenous contrast. RADIATION DOSE REDUCTION: This  exam was performed according to the departmental dose-optimization program which includes automated exposure control, adjustment of the mA and/or kV according to patient size and/or use of iterative reconstruction technique. CONTRAST:  OMNIPAQUE  IOHEXOL  300 MG/ML  SOLN COMPARISON:  05/10/2018 FINDINGS: Lower chest: No acute abnormality Hepatobiliary: Multiple gallstones within the gallbladder. No intrahepatic or extrahepatic biliary ductal dilatation. No suspicious hepatic abnormality. Subtle nodularity to the liver surface suggests cirrhosis. Pancreas: No focal abnormality or ductal dilatation. Spleen: No focal abnormality.  Normal size. Adrenals/Urinary Tract: No adrenal abnormality. No focal renal abnormality. No stones or hydronephrosis. Urinary bladder is unremarkable. Stomach/Bowel: Stomach, large and small bowel grossly unremarkable. Normal appendix. Vascular/Lymphatic: Aortic atherosclerosis. No evidence of aneurysm or adenopathy. Dilated varices in the left abdomen compatible with spontaneous splenorenal shunt. There appears to be partial occlusion of the main portal vein. Prominent vessels in the porta hepatis may reflect early cavernous transformation of the portal vein. Reproductive: No visible focal abnormality. Other: No free fluid or free air. Musculoskeletal: No acute bony abnormality. 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 Nodular contours of the liver compatible with cirrhosis. Cholelithiasis.  No CT evidence of acute cholecystitis. Aortic atherosclerosis. Electronically Signed   By: Franky Crease M.D.   On: 09/19/2023 17:46   CT Head Wo Contrast Result Date: 09/19/2023 CLINICAL DATA:  Syncope, nausea EXAM: CT HEAD WITHOUT CONTRAST TECHNIQUE: Contiguous axial images were obtained from the base of the skull through the vertex without intravenous contrast. RADIATION DOSE REDUCTION: This exam was performed according to the departmental dose-optimization program which includes automated exposure control, adjustment of the mA and/or kV according to patient size and/or use of iterative reconstruction technique. COMPARISON:  None Available. FINDINGS: Brain: No acute infarct or hemorrhage. The lateral ventricles and midline structures are unremarkable. No acute extra-axial fluid collections. No mass effect. Vascular: No hyperdense vessel or unexpected calcification. Skull: Normal. Negative for fracture or focal lesion. Sinuses/Orbits: No acute finding. Other: None. IMPRESSION: 1. No acute intracranial process. Electronically Signed   By: Ozell Daring M.D.   On: 09/19/2023 17:42   DG Chest 2 View Result Date: 09/19/2023 CLINICAL DATA:  Epigastric pain.  Nausea. EXAM: CHEST - 2 VIEW COMPARISON:  05/28/2019 FINDINGS: The heart size and mediastinal contours are within normal limits. Both lungs are clear. The visualized skeletal structures are unremarkable. IMPRESSION: No active cardiopulmonary disease. Electronically Signed   By: Norleen DELENA Kil M.D.   On: 09/19/2023 16:18    Pertinent labs & imaging results that were available during my care of the patient were reviewed by me and considered in my medical decision making (see MDM for details).  Medications Ordered in ED Medications  ondansetron  (ZOFRAN ) tablet 4 mg (4 mg Oral Given 09/19/23 1648)  meclizine  (ANTIVERT ) tablet  25 mg (25 mg Oral Given 09/19/23 1648)  sodium chloride  0.9 % bolus 1,000 mL (1,000 mLs Intravenous New Bag/Given 09/19/23 1649)  iohexol  (OMNIPAQUE ) 300 MG/ML solution 100 mL (100 mLs Intravenous Contrast Given 09/19/23 1719)  Procedures Procedures  (including critical care time)  Medical Decision Making / ED Course    Medical Decision Making:    FIORE DETJEN is a 89 y.o. male with past medical history as below, significant for depression, HTN, glaucoma, arthritis, cirrhosis who presents to the ED with complaint of nausea/ abd pain. . The complaint involves an extensive differential diagnosis and also carries with it a high risk of complications and morbidity.  Serious etiology was considered. Ddx includes but is not limited to: Differential diagnosis includes but is not exclusive to acute cholecystitis, intrathoracic causes for epigastric abdominal pain, gastritis, duodenitis, pancreatitis, small bowel or large bowel obstruction, abdominal aortic aneurysm, hernia, gastritis, etc.   Complete initial physical exam performed, notably the patient was in no distress, exam is reassuring, no hypoxia.    Reviewed and confirmed nursing documentation for past medical history, family history, social history.  Vital signs reviewed.    Clinical Course as of 09/19/23 2039  Sat Sep 19, 2023  1651 Hemoglobin(!): 11.0 Similar to prior  [SG]  1807 Spoke w/ GI Dr Eartha, recommends f/u Dr Legrand, needs EGD. Hold AC until s/p egd to ensure no varices. Reviewed his EGD from a few years ago, no varices.  [SG]    Clinical Course User Index [SG] Elnor Jayson LABOR, DO    Brief summary: 89 year old male with history as above including cirrhosis, hepatitis autoimmune here with epigastric pain, lightheaded, transient confusion.  On arrival he is asymptomatic.  Labs reviewed, ammonia is  mildly elevated, bilirubin is also mildly elevated.  CT imaging revealing for cirrhosis.  Possible mild hepatic encephalopathy as etiology of his mental status changes.  Start lactulose .  He follows hepatology and gastroenterology.  Also possible partial portal vein thrombosis/occlusion, discussed with GI Dr Eartha, recommend follow-up with outpatient GI.  Hold AC until completion of EGD to r/o varices.  Also concern for vertigo, he was given meclizine , symptoms have resolved. Neuro exam is nonfocal.   He feels back to normal on recheck, gait steady, tolerating PO.   The patient improved significantly and was discharged in stable condition. Detailed discussions were had with the patient/guardian regarding current findings, and need for close f/u with PCP or on call doctor. The patient/guardian has been instructed to return immediately if the symptoms worsen in any way for re-evaluation. Patient/guardian verbalized understanding and is in agreement with current care plan. All questions answered prior to discharge.                  Additional history obtained: -Additional history obtained from family -External records from outside source obtained and reviewed including: Chart review including previous notes, labs, imaging, consultation notes including  Primary documentation, prior labs and imaging, home medications   Lab Tests: -I ordered, reviewed, and interpreted labs.   The pertinent results include:   Labs Reviewed  CBC WITH DIFFERENTIAL/PLATELET - Abnormal; Notable for the following components:      Result Value   RBC 2.90 (*)    Hemoglobin 11.0 (*)    HCT 30.7 (*)    MCV 105.9 (*)    MCH 37.9 (*)    RDW 15.8 (*)    Platelets 114 (*)    Lymphs Abs 0.4 (*)    All other components within normal limits  COMPREHENSIVE METABOLIC PANEL - Abnormal; Notable for the following components:   Glucose, Bld 152 (*)    Creatinine, Ser 1.28 (*)    Albumin 3.4 (*)    AST 59 (*)  Total Bilirubin 3.0 (*)    GFR, Estimated 55 (*)    All other components within normal limits  URINALYSIS, ROUTINE W REFLEX MICROSCOPIC - Abnormal; Notable for the following components:   Color, Urine AMBER (*)    All other components within normal limits  AMMONIA - Abnormal; Notable for the following components:   Ammonia 74 (*)    All other components within normal limits  RESP PANEL BY RT-PCR (RSV, FLU A&B, COVID)  RVPGX2  LIPASE, BLOOD  MAGNESIUM  TROPONIN I (HIGH SENSITIVITY)  TROPONIN I (HIGH SENSITIVITY)    Notable for as above  EKG   EKG Interpretation Date/Time:    Ventricular Rate:    PR Interval:    QRS Duration:    QT Interval:    QTC Calculation:   R Axis:      Text Interpretation:           Imaging Studies ordered: I ordered imaging studies including CTAP CTH CXR I independently visualized the following imaging with scope of interpretation limited to determining acute life threatening conditions related to emergency care; findings noted above I independently visualized and interpreted imaging. I agree with the radiologist interpretation   Medicines ordered and prescription drug management: Meds ordered this encounter  Medications   ondansetron  (ZOFRAN ) tablet 4 mg   meclizine  (ANTIVERT ) tablet 25 mg   sodium chloride  0.9 % bolus 1,000 mL   iohexol  (OMNIPAQUE ) 300 MG/ML solution 100 mL   lactulose  (CHRONULAC ) 10 GM/15ML solution    Sig: Take 45 mLs (30 g total) by mouth 3 (three) times daily.    Dispense:  943 mL    Refill:  0   meclizine  (ANTIVERT ) 12.5 MG tablet    Sig: Take 1 tablet (12.5 mg total) by mouth 2 (two) times daily as needed for dizziness.    Dispense:  10 tablet    Refill:  0   sucralfate  (CARAFATE ) 1 g tablet    Sig: Take 1 tablet (1 g total) by mouth with breakfast, with lunch, and with evening meal for 7 days.    Dispense:  21 tablet    Refill:  0    -I have reviewed the patients home medicines and have made adjustments  as needed   Consultations Obtained: I requested consultation with the GI,  and discussed lab and imaging findings as well as pertinent plan - they recommend: f/u office   Cardiac Monitoring: The patient was maintained on a cardiac monitor.  I personally viewed and interpreted the cardiac monitored which showed an underlying rhythm of: NSR Continuous pulse oximetry interpreted by myself, 98% on RA.    Social Determinants of Health:  Diagnosis or treatment significantly limited by social determinants of health: former smoker   Reevaluation: After the interventions noted above, I reevaluated the patient and found that they have resolved  Co morbidities that complicate the patient evaluation  Past Medical History:  Diagnosis Date   Arthritis    Cataract    ight eye and has been removed   Colon polyps    Depression    Glaucoma    HTN (hypertension)    Macular degeneration       Dispostion: Disposition decision including need for hospitalization was considered, and patient discharged from emergency department.    Final Clinical Impression(s) / ED Diagnoses Final diagnoses:  Portal vein thrombosis  Increased ammonia level  Vertigo  Cirrhosis of liver without ascites, unspecified hepatic cirrhosis type (HCC)  Elnor Jayson LABOR, DO 09/19/23 2039

## 2023-09-19 NOTE — Discharge Instructions (Addendum)
 You have an elevation to you ammonia level, this is likely due to your underlying liver disease. I have started you on a medication called LACTULOSE . Start taking this 3 times daily. This will cause you to have more frequent bowel movements. You can reduce the medication to a level where you are having 3-4 bowel movements daily. This should help significantly with your intermittent confusion. Recommend you follow up with your gastroenterologist or hepatologist regarding long term treatment of this.   You also have a partial blockage to one of the veins that connects to your liver. You need to see your gastroenterologist Dr Legrand to have another EGD performed to make sure you dont have any blood vessels in your esophagus called varices which can cause bleeding. After this your gastroenterologist may start you on blood thinners to help prevent this blockage from getting any worse.   It was a pleasure caring for you today in the emergency department.  Please return to the emergency department for any worsening or worrisome symptoms.

## 2023-09-19 NOTE — ED Triage Notes (Signed)
 Pt BIB ems for nausea pt recently started a potassium pill. Pt denies vomiting and diarrhea. Pt states LUQ pain.

## 2023-09-20 ENCOUNTER — Encounter: Payer: Self-pay | Admitting: Family Medicine

## 2023-09-21 ENCOUNTER — Other Ambulatory Visit: Payer: Medicare Other

## 2023-09-25 ENCOUNTER — Ambulatory Visit
Admission: RE | Admit: 2023-09-25 | Discharge: 2023-09-25 | Disposition: A | Discharge status: Home / Self Care | Payer: Medicare Other | Source: Ambulatory Visit | Attending: Family Medicine | Admitting: Family Medicine

## 2023-09-25 DIAGNOSIS — R410 Disorientation, unspecified: Secondary | ICD-10-CM

## 2023-09-29 ENCOUNTER — Encounter: Payer: Self-pay | Admitting: Physician Assistant

## 2023-09-30 ENCOUNTER — Ambulatory Visit: Payer: Medicare Other | Admitting: Physician Assistant

## 2023-10-14 ENCOUNTER — Ambulatory Visit (INDEPENDENT_AMBULATORY_CARE_PROVIDER_SITE_OTHER): Payer: Medicare Other | Admitting: Family Medicine

## 2023-10-14 ENCOUNTER — Encounter: Payer: Self-pay | Admitting: Family Medicine

## 2023-10-14 VITALS — BP 134/60 | HR 81 | Temp 97.9°F | Wt 185.0 lb

## 2023-10-14 DIAGNOSIS — K754 Autoimmune hepatitis: Secondary | ICD-10-CM

## 2023-10-14 DIAGNOSIS — E876 Hypokalemia: Secondary | ICD-10-CM | POA: Diagnosis not present

## 2023-10-14 DIAGNOSIS — I81 Portal vein thrombosis: Secondary | ICD-10-CM

## 2023-10-14 DIAGNOSIS — R7401 Elevation of levels of liver transaminase levels: Secondary | ICD-10-CM

## 2023-10-14 LAB — BASIC METABOLIC PANEL
BUN: 14 mg/dL (ref 6–23)
CO2: 30 meq/L (ref 19–32)
Calcium: 9.1 mg/dL (ref 8.4–10.5)
Chloride: 104 meq/L (ref 96–112)
Creatinine, Ser: 1.02 mg/dL (ref 0.40–1.50)
GFR: 66.52 mL/min (ref >60.00)
Glucose, Bld: 98 mg/dL (ref 70–99)
Potassium: 4.2 meq/L (ref 3.5–5.1)
Sodium: 138 meq/L (ref 135–145)

## 2023-10-14 NOTE — Patient Instructions (Signed)
 Follow up for any recurrent abdominal pain  Also, be in touch for any recurrent confusion.

## 2023-10-14 NOTE — Progress Notes (Unsigned)
 Established Patient Office Visit  Subjective   Patient ID: Ethan Anderson, male    DOB: Jun 27, 1935  Age: 89 y.o. MRN: [id number]  No chief complaint on file.   HPI  {History (Optional):23778} Ethan Anderson history of hypertension, autoimmune hepatitis, gallstones, glaucoma, gout, macular degeneration.  Was seen here recently with some generalized weakness and transient confusion.  He had had some nonspecific dizziness as well.  Wife thought this was related to chlorthalidone.  She stopped his chlorthalidone and we had checked some labs and he had potassium which is extremely low at 2.8.  Started potassium replacement.  Had them hold the chlorthalidone.  Set up MRI brain to further assess.  Was then subsequently seen in the ER a few days later on the eighth with some nausea, dizziness, abdominal pain, and some ongoing cognitive changes.  Perhaps some improved after starting potassium.  He had further testing done.  Chest x-ray unremarkable.  CT head unremarkable.  CT abdomen and pelvis showed gallstones but no evidence for acute cholecystitis.  He did have elevated ammonia level 74.  Repeat potassium had improved to 3.6.  He apparently is also having some vertigo symptoms.  He was discharged on lactulose and wife gave this for couple of weeks but then after this ran out has not given any further.  He states at baseline he usually has 1-2 bowel movements per day.  Seems more back to baseline at this time.  No further nausea.  Did have some recent abdominal pain but this was nonspecific and not localized to the right upper quadrant.  Has not had suspicion for symptomatic gallstones in the past.  Other labs from ER reviewed.  He had respiratory panel which was unremarkable.  Urinalysis unremarkable.  Hemoglobin 11.0 which is near baseline.  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  Nodular contour of  the liver compatible with cirrhosis.  Patient has pending follow-up with GI for consideration of repeat EGD to rule out varices.  He is followed regularly through hepatology clinic  Past Medical History:  Diagnosis Date   Arthritis    Cataract    ight eye and has been removed   Colon polyps    Depression    Glaucoma    HTN (hypertension)    Macular degeneration    Past Surgical History:  Procedure Laterality Date   CATARACT EXTRACTION Right    COLONOSCOPY     MULTIPLE TOOTH EXTRACTIONS     pt weard upper & lower dentures   POLYPECTOMY     UPPER GI ENDOSCOPY  09/21/2018    reports that he quit smoking about 33 years ago. His smoking use included cigarettes. He started smoking about 43 years ago. He has a 10 pack-year smoking history. He has never used smokeless tobacco. He reports that he does not drink alcohol and does not use drugs. family history includes Alcohol abuse in his cousin; Dementia in his father; Other in his grandchild; Ovarian cancer in his mother; Stomach cancer in his mother; Thyroid cancer in his son. No Known Allergies  Review of Systems  Constitutional:  Negative for malaise/fatigue.  Eyes:  Negative for blurred vision.  Respiratory:  Negative for shortness of breath.   Cardiovascular:  Negative for chest pain.  Neurological:  Negative for dizziness, weakness and headaches.      Objective:     BP 134/60 (BP Location: Left Arm, Patient Position: Sitting, Cuff  Size: Normal)   Pulse 81   Temp 97.9 F (36.6 C) (Oral)   Wt 185 lb (83.9 kg)   SpO2 97%   BMI 28.98 kg/m  {Vitals History (Optional):23777}  Physical Exam Vitals reviewed.  Constitutional:      Appearance: He is well-developed.  HENT:     Right Ear: External ear normal.     Left Ear: External ear normal.  Eyes:     Pupils: Pupils are equal, round, and reactive to light.  Neck:     Thyroid: No thyromegaly.  Cardiovascular:     Rate and Rhythm: Normal rate and regular rhythm.   Pulmonary:     Effort: Pulmonary effort is normal. No respiratory distress.     Breath sounds: Normal breath sounds. No wheezing or rales.  Musculoskeletal:     Cervical back: Neck supple.     Right lower leg: No edema.     Left lower leg: No edema.  Neurological:     Mental Status: He is alert and oriented to person, place, and time.      No results found for any visits on 10/14/23.  {Labs (Optional):23779}  The ASCVD Risk score (Arnett DK, et al., 2019) failed to calculate for the following reasons:   The 2019 ASCVD risk score is only valid for ages 13 to 33    Assessment & Plan:   #1 recent hypokalemia.  Patient now off chlorthalidone.  Recheck electrolytes especially in view of recent lactulose therapy.  #2 recent cognitive changes.  Possibly related to metabolic encephalopathy with elevated ammonia levels.  MRI brain showed no acute findings.  Patient improved following lactulose therapy  #3 history of gallstones which are likely asymptomatic.  No evidence for acute cholecystitis on recent imaging.  Denies recent right upper quadrant pain.  #4 history of autoimmune hepatitis/cirrhosis.  Followed to hepatology clinic.  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  Patient has pending follow-up with GI to consider EGD to rule out varices.   No follow-ups on file.    Evelena Peat, MD

## 2023-11-11 ENCOUNTER — Ambulatory Visit: Payer: Medicare Other | Admitting: Physician Assistant

## 2023-11-11 ENCOUNTER — Encounter: Payer: Self-pay | Admitting: Physician Assistant

## 2023-11-11 ENCOUNTER — Other Ambulatory Visit (INDEPENDENT_AMBULATORY_CARE_PROVIDER_SITE_OTHER)

## 2023-11-11 VITALS — BP 130/60 | HR 66 | Ht 68.0 in | Wt 182.0 lb

## 2023-11-11 DIAGNOSIS — K754 Autoimmune hepatitis: Secondary | ICD-10-CM

## 2023-11-11 DIAGNOSIS — K7460 Unspecified cirrhosis of liver: Secondary | ICD-10-CM | POA: Diagnosis not present

## 2023-11-11 DIAGNOSIS — I81 Portal vein thrombosis: Secondary | ICD-10-CM

## 2023-11-11 DIAGNOSIS — K7290 Hepatic failure, unspecified without coma: Secondary | ICD-10-CM

## 2023-11-11 LAB — CBC WITH DIFFERENTIAL/PLATELET
Basophils Absolute: 0 10*3/uL (ref 0.0–0.1)
Basophils Relative: 0.7 % (ref 0.0–3.0)
Eosinophils Absolute: 0.1 10*3/uL (ref 0.0–0.7)
Eosinophils Relative: 2.8 % (ref 0.0–5.0)
HCT: 34.1 % — ABNORMAL LOW (ref 39.0–52.0)
Hemoglobin: 12 g/dL — ABNORMAL LOW (ref 13.0–17.0)
Lymphocytes Relative: 47.2 % — ABNORMAL HIGH (ref 12.0–46.0)
Lymphs Abs: 2.2 10*3/uL (ref 0.7–4.0)
MCHC: 35.2 g/dL (ref 30.0–36.0)
MCV: 108.8 fl — ABNORMAL HIGH (ref 78.0–100.0)
Monocytes Absolute: 0.5 10*3/uL (ref 0.1–1.0)
Monocytes Relative: 10.8 % (ref 3.0–12.0)
Neutro Abs: 1.8 10*3/uL (ref 1.4–7.7)
Neutrophils Relative %: 38.5 % — ABNORMAL LOW (ref 43.0–77.0)
Platelets: 132 10*3/uL — ABNORMAL LOW (ref 150.0–400.0)
RBC: 3.13 Mil/uL — ABNORMAL LOW (ref 4.22–5.81)
RDW: 17.9 % — ABNORMAL HIGH (ref 11.5–15.5)
WBC: 4.6 10*3/uL (ref 4.0–10.5)

## 2023-11-11 LAB — PROTIME-INR
INR: 1.3 ratio — ABNORMAL HIGH (ref 0.8–1.0)
Prothrombin Time: 13.3 s — ABNORMAL HIGH (ref 9.6–13.1)

## 2023-11-11 LAB — COMPREHENSIVE METABOLIC PANEL WITH GFR
ALT: 11 U/L (ref 0–53)
AST: 24 U/L (ref 0–37)
Albumin: 3.8 g/dL (ref 3.5–5.2)
Alkaline Phosphatase: 101 U/L (ref 39–117)
BUN: 16 mg/dL (ref 6–23)
CO2: 27 meq/L (ref 19–32)
Calcium: 9.2 mg/dL (ref 8.4–10.5)
Chloride: 106 meq/L (ref 96–112)
Creatinine, Ser: 1.2 mg/dL (ref 0.40–1.50)
GFR: 54.7 mL/min — ABNORMAL LOW (ref >60.00)
Glucose, Bld: 96 mg/dL (ref 70–99)
Potassium: 4.3 meq/L (ref 3.5–5.1)
Sodium: 140 meq/L (ref 135–145)
Total Bilirubin: 1.3 mg/dL — ABNORMAL HIGH (ref 0.2–1.2)
Total Protein: 7.4 g/dL (ref 6.0–8.3)

## 2023-11-11 LAB — AMMONIA: Ammonia: 57 umol/L — ABNORMAL HIGH (ref 11–35)

## 2023-11-11 NOTE — Patient Instructions (Addendum)
 Your provider has requested that you go to the basement level for lab work before leaving today. Press "B" on the elevator. The lab is located at the first door on the left as you exit the elevator.  You have been scheduled for an endoscopy. Please follow written instructions given to you at your visit today.  If you use inhalers (even only as needed), please bring them with you on the day of your procedure.  If you take any of the following medications, they will need to be adjusted prior to your procedure:   DO NOT TAKE 7 DAYS PRIOR TO TEST- Trulicity (dulaglutide) Ozempic, Wegovy (semaglutide) Mounjaro (tirzepatide) Bydureon Bcise (exanatide extended release)  DO NOT TAKE 1 DAY PRIOR TO YOUR TEST Rybelsus (semaglutide) Adlyxin (lixisenatide) Victoza (liraglutide) Byetta (exanatide) ___________________________________________________________________________ Due to recent changes in healthcare laws, you may see the results of your imaging and laboratory studies on MyChart before your provider has had a chance to review them.  We understand that in some cases there may be results that are confusing or concerning to you. Not all laboratory results come back in the same time frame and the provider may be waiting for multiple results in order to interpret others.  Please give [place] 48 hours in order for your provider to thoroughly review all the results before contacting the office for clarification of your results.  _______________________________________________________  If your blood pressure at your visit was 140/90 or greater, please contact your primary care physician to follow up on this.  _______________________________________________________  If you are age 87 or older, your body mass index should be between 23-30. Your Body mass index is 27.67 kg/m. If this is out of the aforementioned range listed, please consider follow up with your Primary Care Provider.  If you are age 56 or  younger, your body mass index should be between 19-25. Your Body mass index is 27.67 kg/m. If this is out of the aformentioned range listed, please consider follow up with your Primary Care Provider.   ________________________________________________________  The [facility] providers would like to encourage you to use [facility] to communicate with providers for non-urgent requests or questions.  Due to long hold times on the telephone, sending your provider a message by [facility] may be a faster and more efficient way to get a response.  Please allow 48 business hours for a response.  Please remember that this is for non-urgent requests.  _______________________________________________________

## 2023-11-11 NOTE — Progress Notes (Signed)
 Chief Complaint: Discuss EGD  HPI:    Mr. Ethan Anderson is a an 89 year old male with a past medical history as listed below including autoimmune hepatitis, known to Dr. Myrtie Neither, who was referred to me by Kristian Covey, MD for a complaint of discussed EGD.      10/21/2017 colonoscopy with a 12 mm polyp in the cecum, three 5-10 mm polyps in the distal transverse colon, one 3 mm polyp in the rectum and internal hemorrhoids.  No repeat due to age.    09/21/2018 EGD for cirrhosis with small hiatal hernia otherwise normal.  No repeat due to age.    07/19/2019 patient seen in clinic by Dr. Myrtie Neither.  Noted autoimmune hepatitis with a MELD score of 8 evaluated and treated by [facility] clinic.  At that time noted stable autoimmune hepatitis with improvement in LFTs on low-dose immunosuppression.  He has close follow-up with hepatology clinic.  Recommended follow-up in 6 months.    07/15/2023 right upper quadrant ultrasound for screening for [facility] with cirrhotic morphology of the liver, cholelithiasis without acute cholecystitis, probable complicated cyst in the left hepatic lobe-recommend follow-up ultrasound in 6 months.    09/19/2023 CTAP with contrast done for syncope and left upper quadrant pain with partial occlusion/thrombosis of the main portal vein with early cavernous transformation, associated spontaneous left splenorenal shunt.  Nodular contour of the liver compatible with cirrhosis, cholelithiasis.    09/19/2023 ER visit for feeling unwell for a few weeks with intermittent dizziness and confusion.  At that time found to have an ammonia of 74, CTAP as above.  At that time spoke with Dr. Levon Hedger and he recommended follow-up with Dr. Myrtie Neither and that the patient needs EGD.  Hold on anticoagulation until status post EGD to ensure no varices.  Given lactulose.    09/21/2023 phone message from [facility] at the liver clinic, his ammonia had been high in the hospital and was started on lactulose 45 mL 3 times  daily.  Also recommended he follow-up with [place] for an EGD.    Today, the patient presents to clinic accompanied by his wife.  Explains that he has been doing well since recent hospitalization.  Per his wife he took Lactulose 3 times daily for a while, but then seemed to become more clearheaded and most recently with no further confusion or dizziness so they have not been taking it over the past month.  Described that while in the hospital he was told that he needed a repeat EGD.  There was finding of a portal vein thrombosis and there thinking about starting him on anticoagulation but wanted EGD prior.  Denies abdominal pain or other change in symptoms.  Remains on Azathioprine.    Denies fever, chills, weight loss or blood in his stool.    Past Medical History:  Diagnosis Date   Arthritis    Cataract    ight eye and has been removed   Colon polyps    Depression    Glaucoma    HTN (hypertension)    Macular degeneration     Past Surgical History:  Procedure Laterality Date   CATARACT EXTRACTION Right    COLONOSCOPY     MULTIPLE TOOTH EXTRACTIONS     pt weard upper & lower dentures   POLYPECTOMY     UPPER GI ENDOSCOPY  09/21/2018    Current Outpatient Medications  Medication Sig Dispense Refill   amLODipine (NORVASC) 5 MG tablet TAKE ONE (1) TABLET EACH DAY 90 tablet  1   azaTHIOprine (IMURAN) 50 MG tablet Take 100 mg by mouth in the morning, at noon, and at bedtime. Patient is taking all 3 at one time     furosemide (LASIX) 40 MG tablet TAKE 1 AND 1/2 TABLETS DAILY 135 tablet 0   latanoprost (XALATAN) 0.005 % ophthalmic solution Place 1 drop into the right eye at bedtime.     meclizine (ANTIVERT) 12.5 MG tablet Take 1 tablet (12.5 mg total) by mouth 2 (two) times daily as needed for dizziness. 10 tablet 0   Multiple Vitamins-Minerals (EYE VITAMINS PO) Take 2 capsules by mouth daily.      olmesartan (BENICAR) 5 MG tablet Take 1 tablet (5 mg total) by mouth daily. 90 tablet 3    potassium chloride (KLOR-CON) 10 MEQ tablet TAKE ONE (1) TABLET EACH DAY 90 tablet 1   No current facility-administered medications for this visit.    Allergies as of 11/11/2023   (No Known Allergies)    Family History  Problem Relation Age of Onset   Ovarian cancer Mother    Stomach cancer Mother    Dementia Father    Alcohol abuse Cousin    Thyroid cancer Son    Other Grandchild        Primary Sclerosing Cholangitis   Colon cancer Neg Hx    Esophageal cancer Neg Hx    Rectal cancer Neg Hx    Pancreatic cancer Neg Hx    Prostate cancer Neg Hx     Social History   Socioeconomic History   Marital status: Married    Spouse name: Doris   Number of children: 6   Years of education: Not on file   Highest education level: Not on file  Occupational History   Occupation: retired  Tobacco Use   Smoking status: Former    Current packs/day: 0.00    Average packs/day: 1 pack/day for 10.0 years (10.0 ttl pk-yrs)    Types: Cigarettes    Start date: 08/18/1980    Quit date: 08/18/1990    Years since quitting: 33.2   Smokeless tobacco: Never  Vaping Use   Vaping status: Never Used  Substance and Sexual Activity   Alcohol use: No   Drug use: No   Sexual activity: Not on file  Other Topics Concern   Not on file  Social History Narrative   Not on file   Social Drivers of Health   Financial Resource Strain: Low Risk  (07/15/2023)   Overall Financial Resource Strain (CARDIA)    Difficulty of Paying Living Expenses: Not hard at all  Food Insecurity: No Food Insecurity (07/15/2023)   Hunger Vital Sign    Worried About Running Out of Food in the Last Year: Never true    Ran Out of Food in the Last Year: Never true  Transportation Needs: No Transportation Needs (07/15/2023)   PRAPARE - Administrator, Civil Service (Medical): No    Lack of Transportation (Non-Medical): No  Physical Activity: Inactive (07/15/2023)   Exercise Vital Sign    Days of Exercise per Week: 0  days    Minutes of Exercise per Session: 0 min  Stress: No Stress Concern Present (07/15/2023)   [employer] of Occupational Health - Occupational Stress Questionnaire    Feeling of Stress : Not at all  Social Connections: Socially Integrated (07/15/2023)   Social Connection and Isolation Panel [NHANES]    Frequency of Communication with Friends and Family: More than three times a week  Frequency of Social Gatherings with Friends and Family: More than three times a week    Attends Religious Services: More than 4 times per year    Active Member of [employer] or Organizations: Yes    Attends Engineer, structural: More than 4 times per year    Marital Status: Married  Catering manager Violence: Not At Risk (07/15/2023)   Humiliation, Afraid, Rape, and Kick questionnaire    Fear of Current or Ex-Partner: No    Emotionally Abused: No    Physically Abused: No    Sexually Abused: No    Review of Systems:    Constitutional: No weight loss, fever or chills Cardiovascular: No chest pain   Respiratory: No SOB  Gastrointestinal: See HPI and otherwise negative   Physical Exam:  Vital signs: BP 130/60   Pulse 66   Ht 5\' 8"  (1.727 m)   Wt 182 lb (82.6 kg)   BMI 27.67 kg/m    Constitutional:   Pleasant AA male appears to be in NAD, Well developed, Well nourished, alert and cooperative Respiratory: Respirations even and unlabored. Lungs clear to auscultation bilaterally.   No wheezes, crackles, or rhonchi.  Cardiovascular: Normal S1, S2. No MRG. Regular rate and rhythm. No peripheral edema, cyanosis or pallor.  Gastrointestinal:  Soft, nondistended, nontender. No rebound or guarding. Normal bowel sounds. No appreciable masses or hepatomegaly. Rectal:  Not performed.  Psychiatric: Oriented to person, place and time. Demonstrates good judgement and reason without abnormal affect or behaviors.  RELEVANT LABS AND IMAGING: CBC    Component Value Date/Time   WBC 5.3 09/19/2023 1630    RBC 2.90 (L) 09/19/2023 1630   HGB 11.0 (L) 09/19/2023 1630   HCT 30.7 (L) 09/19/2023 1630   PLT 114 (L) 09/19/2023 1630   MCV 105.9 (H) 09/19/2023 1630   MCH 37.9 (H) 09/19/2023 1630   MCHC 35.8 09/19/2023 1630   RDW 15.8 (H) 09/19/2023 1630   LYMPHSABS 0.4 (L) 09/19/2023 1630   MONOABS 0.2 09/19/2023 1630   EOSABS 0.0 09/19/2023 1630   BASOSABS 0.0 09/19/2023 1630    CMP     Component Value Date/Time   NA 138 10/14/2023 1200   NA 140 08/30/2018 0000   K 4.2 10/14/2023 1200   CL 104 10/14/2023 1200   CO2 30 10/14/2023 1200   GLUCOSE 98 10/14/2023 1200   BUN 14 10/14/2023 1200   CREATININE 1.02 10/14/2023 1200   CREATININE 1.02 05/07/2020 1433   CALCIUM 9.1 10/14/2023 1200   PROT 7.1 09/19/2023 1630   ALBUMIN 3.4 (L) 09/19/2023 1630   AST 59 (H) 09/19/2023 1630   ALT 24 09/19/2023 1630   ALKPHOS 92 09/19/2023 1630   BILITOT 3.0 (H) 09/19/2023 1630   GFRNONAA 55 (L) 09/19/2023 1630   GFRAA >60 05/30/2019 0451    Assessment: 1.  Autoimmune hepatitis with cirrhosis: Follows with [facility], they recommended EGD as well prior to possible treatment for portal vein thrombosis 2.  Elevated ammonia: Recently in the ED when he presented for dizziness, was on Lactulose for a period of time, see his to be doing better per his wife 3.  Portal vein thrombosis  Plan: 1.  Liver clinic recommending EGD prior to possibly starting anticoagulation for portal vein thrombosis.  Scheduled patient for diagnostic EGD in the [facility] with Dr. Myrtie Neither.  Did provide the patient a detailed list of risks for the procedure and he agrees to proceed. 2.  Will recheck labs today to include a CBC,  CMP, PT/INR and ammonia level, can recalculate MELD 3.  Patient continues to follow with [facility] liver clinic, has upcoming visit with them within the month per his wife 4.  Can stay off Lactulose for now as he is having 2-3 bowel movements a day already 5.  Patient to follow in clinic per recommendations  after time of procedure  Hyacinth Meeker, Cordelia Poche  Gastroenterology 11/11/2023, 1:38 PM  Cc: Kristian Covey, MD

## 2023-11-11 NOTE — Progress Notes (Signed)
 ____________________________________________________________  Attending physician addendum:  Thank you for sending this case to me. I have reviewed the entire note and agree with the plan.  Bilirubin improved from 2 months ago.  Platelets mildly decreased, INR 1.3 He can have his EGD in the [facility]  In addition, though he has reportedly had normal mental status off lactulose for the last month, he is at risk for recurrent hepatic encephalopathy given his age and condition, and I recommend that he take 30 to 45 mL of lactulose once a day.  If his stools become too loose or more than several a day, he can cut that volume by half.  Amada Jupiter, MD  ____________________________________________________________

## 2023-11-12 ENCOUNTER — Telehealth: Payer: Self-pay

## 2023-11-12 ENCOUNTER — Other Ambulatory Visit: Payer: Self-pay

## 2023-11-12 ENCOUNTER — Telehealth: Payer: Self-pay | Admitting: Physician Assistant

## 2023-11-12 DIAGNOSIS — K7290 Hepatic failure, unspecified without coma: Secondary | ICD-10-CM

## 2023-11-12 DIAGNOSIS — K754 Autoimmune hepatitis: Secondary | ICD-10-CM

## 2023-11-12 DIAGNOSIS — I81 Portal vein thrombosis: Secondary | ICD-10-CM

## 2023-11-12 MED ORDER — LACTULOSE 20 GM/30ML PO SOLN
30.0000 mL | Freq: Every day | ORAL | 11 refills | Status: AC
Start: 2023-11-12 — End: ?

## 2023-11-12 NOTE — Telephone Encounter (Signed)
 Returning call.

## 2023-11-12 NOTE — Progress Notes (Signed)
 MELD-NA is 12 (6% estimated 18-month mortality), this looks good, in general his labs look much better.  Do recommend that he starts Lactulose 30 mL daily to prevent recurrence of hepatic encephalopathy.  My nurse will send in refills of this.  Hyacinth Meeker, PA-C

## 2023-12-04 NOTE — Telephone Encounter (Signed)
 See my chart message

## 2023-12-10 NOTE — Telephone Encounter (Signed)
 Spoke with patient's wife about new procedure date and time. Patient is aware.

## 2023-12-15 NOTE — Progress Notes (Signed)
 Pt's wife was notified of Ethan Anderson's recommendations.

## 2023-12-22 ENCOUNTER — Ambulatory Visit (AMBULATORY_SURGERY_CENTER): Admitting: Gastroenterology

## 2023-12-22 ENCOUNTER — Encounter: Payer: Self-pay | Admitting: Gastroenterology

## 2023-12-22 VITALS — BP 118/63 | HR 76 | Temp 97.0°F | Resp 12 | Ht 68.0 in | Wt 182.0 lb

## 2023-12-22 DIAGNOSIS — K2980 Duodenitis without bleeding: Secondary | ICD-10-CM

## 2023-12-22 DIAGNOSIS — K2970 Gastritis, unspecified, without bleeding: Secondary | ICD-10-CM

## 2023-12-22 DIAGNOSIS — B9681 Helicobacter pylori [H. pylori] as the cause of diseases classified elsewhere: Secondary | ICD-10-CM | POA: Diagnosis not present

## 2023-12-22 DIAGNOSIS — K2950 Unspecified chronic gastritis without bleeding: Secondary | ICD-10-CM

## 2023-12-22 DIAGNOSIS — K754 Autoimmune hepatitis: Secondary | ICD-10-CM

## 2023-12-22 DIAGNOSIS — K7290 Hepatic failure, unspecified without coma: Secondary | ICD-10-CM

## 2023-12-22 MED ORDER — SODIUM CHLORIDE 0.9 % IV SOLN: 500.0000 mL | Freq: Once | INTRAVENOUS | Status: DC

## 2023-12-22 NOTE — Progress Notes (Signed)
 To pacu, VSS. Report to Rn.tb

## 2023-12-22 NOTE — Progress Notes (Signed)
 History and Physical:  This patient presents for endoscopic testing for: Encounter Diagnoses  Name Primary?   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  Hepatology requesting this in order to make a decision regarding the use of anticoagulation for the PVT. He denies abdominal pain. Further clinical details in 11/11/23 APP office visit note.  Patient is otherwise without complaints or active issues today.   Past Medical History: Past Medical History:  Diagnosis Date   Arthritis    Cataract    ight eye and has been removed   Colon polyps    Depression    Glaucoma    HTN (hypertension)    Macular degeneration      Past Surgical History: Past Surgical History:  Procedure Laterality Date   CATARACT EXTRACTION Right    COLONOSCOPY     MULTIPLE TOOTH EXTRACTIONS     pt weard upper & lower dentures   POLYPECTOMY     UPPER GI ENDOSCOPY  09/21/2018    Allergies: No Known Allergies  Outpatient Meds: Current Outpatient Medications  Medication Sig Dispense Refill   amLODipine  (NORVASC ) 5 MG tablet TAKE ONE (1) TABLET EACH DAY 90 tablet 1   azaTHIOprine  (IMURAN ) 50 MG tablet Take 100 mg by mouth in the morning, at noon, and at bedtime. Patient is taking all 3 at one time     furosemide  (LASIX ) 40 MG tablet TAKE 1 AND 1/2 TABLETS DAILY 135 tablet 0   Lactulose  20 GM/30ML SOLN Take 30 mLs (20 g total) by mouth daily. 450 mL 11   latanoprost (XALATAN) 0.005 % ophthalmic solution Place 1 drop into the right eye at bedtime.     meclizine  (ANTIVERT ) 12.5 MG tablet Take 1 tablet (12.5 mg total) by mouth 2 (two) times daily as needed for dizziness. 10 tablet 0   Multiple Vitamins-Minerals (EYE VITAMINS PO) Take 2 capsules by mouth daily.      olmesartan  (BENICAR ) 5 MG tablet Take 1 tablet (5 mg total) by mouth daily.  90 tablet 3   potassium chloride  (KLOR-CON ) 10 MEQ tablet TAKE ONE (1) TABLET EACH DAY 90 tablet 1   Current Facility-Administered Medications  Medication Dose Route Frequency Provider Last Rate Last Admin   0.9 %  sodium chloride  infusion  500 mL Intravenous Once Danis, Stevee Valenta L III, MD          ___________________________________________________________________ Objective   Exam:  BP (!) 140/68   Pulse 85   Temp (!) 97 F (36.1 C)   Resp 11   Ht 5\' 8"  (1.727 m)   Wt 182 lb (82.6 kg)   SpO2 100%   BMI 27.67 kg/m   CV: regular , S1/S2 Resp: clear to auscultation bilaterally, normal RR and effort noted GI: soft, no tenderness, with active bowel sounds.   Assessment: Encounter Diagnoses  Name Primary?   Decompensated cirrhosis (HCC) Yes   Autoimmune hepatitis (HCC)      Plan: EGD  The benefits and risks of the planned procedure(s) were described in detail with the patient or (when appropriate) their health care proxy.  Risks were outlined as including, but not limited to, bleeding, infection, perforation, adverse medication reaction leading to cardiac or pulmonary decompensation, pancreatitis (if ERCP).  The limitation of incomplete mucosal visualization was also discussed.  No guarantees or  warranties were given.  The patient is appropriate for an endoscopic procedure in the ambulatory setting.   - Lorella Roles, MD

## 2023-12-22 NOTE — Op Note (Signed)
 [facility] Patient Name: Ethan Anderson Procedure Date: 12/22/2023 10:26 AM MRN: [id number] Endoscopist: Ace Abu L. Dominic Friendly , MD, [id number] Age: 89 Referring MD:  Date of Birth: 1934-12-07 Gender: Male Account #: [id number] Procedure:                Upper GI endoscopy Indications:              Cirrhosis rule out esophageal varices Medicines:                Monitored Anesthesia Care Procedure:                Pre-Anesthesia Assessment:                           - Prior to the procedure, a History and Physical                            was performed, and patient medications and                            allergies were reviewed. The patient's tolerance of                            previous anesthesia was also reviewed. The risks                            and benefits of the procedure and the sedation                            options and risks were discussed with the patient.                            All questions were answered, and informed consent                            was obtained. Prior Anticoagulants: The patient has                            taken no anticoagulant or antiplatelet agents. ASA                            Grade Assessment: III - A patient with severe                            systemic disease. After reviewing the risks and                            benefits, the patient was deemed in satisfactory                            condition to undergo the procedure.                           After obtaining informed consent, the endoscope was  passed under direct vision. Throughout the                            procedure, the patient's blood pressure, pulse, and                            oxygen saturations were monitored continuously. The                            GIF [id number] #[id number] was introduced through the                            mouth, and advanced to the second part of duodenum.                            The upper GI  endoscopy was accomplished without                            difficulty. The patient tolerated the procedure                            well. Scope In: Scope Out: Findings:                 There is no endoscopic evidence of esophagitis or                            varices in the entire esophagus.                           Patchy mild inflammation characterized by erosions,                            erythema, granularity and nodularity was found in                            the gastric body. Biopsies were taken with a cold                            forceps for histology. (antrum and body in same                            pathology jar to r/o H pylori)                           The exam of the stomach was otherwise normal,                            including on retroflexion. No gastric varices seen.                           Mild inflammation characterized by congestion                            (edema), erosions and erythema  was found in the                            duodenal bulb.                           The exam of the duodenum was otherwise normal. Complications:            No immediate complications. Estimated Blood Loss:     Estimated blood loss was minimal. Impression:               - Gastritis. Biopsied.                           - Duodenitis. Recommendation:           - Patient has a contact number available for                            emergencies. The signs and symptoms of potential                            delayed complications were discussed with the                            patient. Return to normal activities tomorrow.                            Written discharge instructions were provided to the                            patient.                           - Resume previous diet.                           - Continue present medications.                           - Await pathology results.                           - Follow up with the [facility]  as                            scheduled. This report will be forwarded to them. Yaslyn Cumby L. Dominic Friendly, MD 12/22/2023 11:03:57 AM This report has been signed electronically.

## 2023-12-22 NOTE — Progress Notes (Signed)
 Called to room to assist during endoscopic procedure.  Patient ID and intended procedure confirmed with present staff. Received instructions for my participation in the procedure from the performing physician.

## 2023-12-22 NOTE — Patient Instructions (Signed)

## 2023-12-23 ENCOUNTER — Telehealth: Payer: Self-pay

## 2023-12-23 NOTE — Telephone Encounter (Signed)
  Follow up Call-     12/22/2023    9:21 AM  Call back number  Post procedure Call Back phone  # [phone]  Permission to leave phone message Yes     Patient questions:  Do you have a fever, pain , or abdominal swelling? No. Pain Score  0 *  Have you tolerated food without any problems? Yes.    Have you been able to return to your normal activities? Yes.    Do you have any questions about your discharge instructions: Diet   No. Medications  No. Follow up visit  No.  Do you have questions or concerns about your Care? No.  Actions: * If pain score is 4 or above: No action needed, pain <4.

## 2023-12-24 ENCOUNTER — Telehealth: Payer: Self-pay | Admitting: Gastroenterology

## 2023-12-24 LAB — SURGICAL PATHOLOGY

## 2023-12-24 NOTE — Telephone Encounter (Signed)
 Patient wife called and stated that her husband had a endoscopy done on the 13 th and that Dr. Dominic Friendly found signs of GERD. Patient wife stated that he was going to send over medication for it. Patient wife is requesting a call back. Please advise.

## 2023-12-24 NOTE — Telephone Encounter (Signed)
 Spoke with spouse, she states Dr. Dominic Friendly told her he would start patient on an abt for gastritis. Advised spouse that during EGD Dr. Dominic Friendly took biopsies for H Pylori. Advised her that we would need to wait for those pathology results to determine if abts would be appropriate. Spouse verbalized understanding.

## 2023-12-27 ENCOUNTER — Ambulatory Visit: Payer: Self-pay | Admitting: Gastroenterology

## 2023-12-28 ENCOUNTER — Other Ambulatory Visit: Payer: Self-pay

## 2023-12-28 DIAGNOSIS — A048 Other specified bacterial intestinal infections: Secondary | ICD-10-CM

## 2023-12-28 MED ORDER — BISMUTH SUBSALICYLATE 262 MG PO TABS: 2.0000 | ORAL_TABLET | Freq: Four times a day (QID) | ORAL | 0 refills | Status: AC

## 2023-12-28 MED ORDER — METRONIDAZOLE 250 MG PO TABS: 250.0000 mg | ORAL_TABLET | Freq: Four times a day (QID) | ORAL | 0 refills | Status: AC

## 2023-12-28 MED ORDER — OMEPRAZOLE 20 MG PO CPDR: 20.0000 mg | DELAYED_RELEASE_CAPSULE | Freq: Two times a day (BID) | ORAL | 0 refills | Status: AC

## 2023-12-28 MED ORDER — TETRACYCLINE HCL 500 MG PO CAPS: 500.0000 mg | ORAL_CAPSULE | Freq: Four times a day (QID) | ORAL | 0 refills | Status: AC

## 2024-01-18 ENCOUNTER — Other Ambulatory Visit: Payer: Self-pay | Admitting: Family Medicine

## 2024-01-21 ENCOUNTER — Other Ambulatory Visit: Payer: Self-pay | Admitting: Nurse Practitioner

## 2024-01-21 DIAGNOSIS — K754 Autoimmune hepatitis: Secondary | ICD-10-CM

## 2024-01-21 DIAGNOSIS — I81 Portal vein thrombosis: Secondary | ICD-10-CM

## 2024-01-21 DIAGNOSIS — K7469 Other cirrhosis of liver: Secondary | ICD-10-CM

## 2024-01-26 ENCOUNTER — Ambulatory Visit
Admission: RE | Admit: 2024-01-26 | Discharge: 2024-01-26 | Disposition: A | Discharge status: Home / Self Care | Source: Ambulatory Visit | Attending: Nurse Practitioner | Admitting: Nurse Practitioner

## 2024-01-26 DIAGNOSIS — I81 Portal vein thrombosis: Secondary | ICD-10-CM

## 2024-01-26 DIAGNOSIS — K7469 Other cirrhosis of liver: Secondary | ICD-10-CM

## 2024-01-26 DIAGNOSIS — K754 Autoimmune hepatitis: Secondary | ICD-10-CM

## 2024-01-26 MED ORDER — IOPAMIDOL (ISOVUE-300) INJECTION 61%
100.0000 mL | Freq: Once | INTRAVENOUS | Status: AC | PRN
  Administered 2024-01-26: 100 mL via INTRAVENOUS

## 2024-02-24 ENCOUNTER — Encounter: Payer: Self-pay | Admitting: Family Medicine

## 2024-02-24 ENCOUNTER — Ambulatory Visit: Admitting: Family Medicine

## 2024-02-24 VITALS — BP 128/60 | HR 96 | Temp 98.2°F | Wt 188.9 lb

## 2024-02-24 DIAGNOSIS — D649 Anemia, unspecified: Secondary | ICD-10-CM | POA: Diagnosis not present

## 2024-02-24 DIAGNOSIS — I10 Essential (primary) hypertension: Secondary | ICD-10-CM | POA: Diagnosis not present

## 2024-02-24 DIAGNOSIS — K754 Autoimmune hepatitis: Secondary | ICD-10-CM

## 2024-02-24 NOTE — Progress Notes (Unsigned)
 Established Patient Office Visit  Subjective   Patient ID: Ethan Anderson, male    DOB: Feb 02, 1935  Age: 89 y.o. MRN: [id number]  Chief Complaint  Patient presents with   Medical Management of Chronic Issues    HPI  {History (Optional):23778} Mr. Coleman is seen for medical follow-up.  He has history of hypertension.  Had been on chlorthalidone  but had some recurrent hypokalemia.  His blood pressure was stable and we took him off chlorthalidone .  Blood pressures have been stable since then.  He remains on Benicar  5 mg daily and amlodipine  5 mg daily.  No recent dizziness.  He had cognitive changes last visit with suspected metabolic encephalopathy related to elevated ammonia levels from his chronic liver disease.  Currently on lactulose  and they feel like his mental status has improved.  Recent MRI brain unremarkable.  Autoimmune hepatitis followed by liver clinic.  He also is followed closely by GI.  Had recent diagnosis of Helicobacter pylori.  Was treated and has pending follow-up breath test.  Chronic normocytic anemia.  Hemoglobin over 10 range.  He had EGD in May with no evidence for esophagitis or varices.  Did have some gastritis and duodenitis.  Weight stable.  Past Medical History:  Diagnosis Date   Arthritis    Cataract    ight eye and has been removed   Colon polyps    Depression    Glaucoma    HTN (hypertension)    Macular degeneration    Past Surgical History:  Procedure Laterality Date   CATARACT EXTRACTION Right    COLONOSCOPY     MULTIPLE TOOTH EXTRACTIONS     pt weard upper & lower dentures   POLYPECTOMY     UPPER GI ENDOSCOPY  09/21/2018    reports that he quit smoking about 33 years ago. His smoking use included cigarettes. He started smoking about 43 years ago. He has a 10 pack-year smoking history. He has never used smokeless tobacco. He reports that he does not drink alcohol and does not use drugs. family history includes Alcohol abuse in his cousin;  Dementia in his father; Other in his grandchild; Ovarian cancer in his mother; Stomach cancer in his mother; Thyroid  cancer in his son. No Known Allergies  Review of Systems  Constitutional:  Negative for malaise/fatigue.  Eyes:  Negative for blurred vision.  Respiratory:  Negative for shortness of breath.   Cardiovascular:  Negative for chest pain.  Gastrointestinal:  Negative for abdominal pain.  Neurological:  Negative for dizziness, weakness and headaches.      Objective:     BP 128/60 (BP Location: Left Arm, Patient Position: Sitting, Cuff Size: Normal)   Pulse 96   Temp 98.2 F (36.8 C) (Oral)   Wt 188 lb 14.4 oz (85.7 kg)   SpO2 98%   BMI 28.72 kg/m  BP Readings from Last 3 Encounters:  02/24/24 128/60  12/22/23 118/63  11/11/23 130/60   Wt Readings from Last 3 Encounters:  02/24/24 188 lb 14.4 oz (85.7 kg)  12/22/23 182 lb (82.6 kg)  11/11/23 182 lb (82.6 kg)      Physical Exam Vitals reviewed.  Constitutional:      General: He is not in acute distress.    Appearance: He is well-developed. He is not ill-appearing.  Eyes:     Pupils: Pupils are equal, round, and reactive to light.  Neck:     Thyroid : No thyromegaly.  Cardiovascular:     Rate and Rhythm: Normal  rate and regular rhythm.  Pulmonary:     Effort: Pulmonary effort is normal. No respiratory distress.     Breath sounds: Normal breath sounds. No wheezing or rales.  Musculoskeletal:     Cervical back: Neck supple.     Comments: Trace edema lower legs bilaterally  Neurological:     Mental Status: He is alert and oriented to person, place, and time.      No results found for any visits on 02/24/24.  Last CBC Lab Results  Component Value Date   WBC 4.6 11/11/2023   HGB 12.0 (L) 11/11/2023   HCT 34.1 (L) 11/11/2023   MCV 108.8 (H) 11/11/2023   MCH 37.9 (H) 09/19/2023   RDW 17.9 (H) 11/11/2023   PLT 132.0 (L) 11/11/2023   Last metabolic panel Lab Results  Component Value Date    GLUCOSE 96 11/11/2023   NA 140 11/11/2023   K 4.3 11/11/2023   CL 106 11/11/2023   CO2 27 11/11/2023   BUN 16 11/11/2023   CREATININE 1.20 11/11/2023   GFR 54.70 (L) 11/11/2023   CALCIUM 9.2 11/11/2023   PROT 7.4 11/11/2023   ALBUMIN 3.8 11/11/2023   BILITOT 1.3 (H) 11/11/2023   ALKPHOS 101 11/11/2023   AST 24 11/11/2023   ALT 11 11/11/2023   ANIONGAP 12 09/19/2023   Last hemoglobin A1c No results found for: HGBA1C Last thyroid  functions Lab Results  Component Value Date   TSH 5.56 (H) 08/28/2023   Last vitamin B12 and Folate Lab Results  Component Value Date   VITAMINB12 553 08/28/2023   FOLATE 18.0 07/25/2022      The ASCVD Risk score (Arnett DK, et al., 2019) failed to calculate for the following reasons:   The 2019 ASCVD risk score is only valid for ages 47 to 65    Assessment & Plan:   #1 hypertension stable and at goal.  Continue amlodipine  5 mg daily and Benicar  5 mg daily.  Continue low-sodium diet.  Continue monitoring periodically.  #2 history of autoimmune hepatitis followed by GI.  Clinically stable.  History of metabolic encephalopathy related to high ammonia levels.  Currently stable on lactulose .  Recent EGD showed no evidence for varices.  #3 chronic normocytic anemia.  Probably related to anemia of chronic disease.  Wolm Scarlet, MD

## 2024-02-26 ENCOUNTER — Ambulatory Visit: Payer: Medicare Other | Admitting: Family Medicine

## 2024-02-27 ENCOUNTER — Other Ambulatory Visit: Payer: Self-pay | Admitting: Family Medicine

## 2024-03-25 ENCOUNTER — Other Ambulatory Visit: Payer: Self-pay | Admitting: Family Medicine

## 2024-04-01 ENCOUNTER — Encounter (INDEPENDENT_AMBULATORY_CARE_PROVIDER_SITE_OTHER): Payer: Medicare Other | Admitting: Ophthalmology

## 2024-04-01 DIAGNOSIS — H353132 Nonexudative age-related macular degeneration, bilateral, intermediate dry stage: Secondary | ICD-10-CM | POA: Diagnosis not present

## 2024-04-01 DIAGNOSIS — H35033 Hypertensive retinopathy, bilateral: Secondary | ICD-10-CM | POA: Diagnosis not present

## 2024-04-01 DIAGNOSIS — I10 Essential (primary) hypertension: Secondary | ICD-10-CM | POA: Diagnosis not present

## 2024-04-01 DIAGNOSIS — H43813 Vitreous degeneration, bilateral: Secondary | ICD-10-CM

## 2024-04-01 DIAGNOSIS — H348122 Central retinal vein occlusion, left eye, stable: Secondary | ICD-10-CM

## 2024-04-18 ENCOUNTER — Other Ambulatory Visit: Payer: Self-pay | Admitting: Nurse Practitioner

## 2024-04-18 DIAGNOSIS — D376 Neoplasm of uncertain behavior of liver, gallbladder and bile ducts: Secondary | ICD-10-CM

## 2024-04-26 ENCOUNTER — Ambulatory Visit
Admission: RE | Admit: 2024-04-26 | Discharge: 2024-04-26 | Disposition: A | Discharge status: Home / Self Care | Source: Ambulatory Visit | Attending: Nurse Practitioner

## 2024-04-26 DIAGNOSIS — D376 Neoplasm of uncertain behavior of liver, gallbladder and bile ducts: Secondary | ICD-10-CM

## 2024-04-26 MED ORDER — GADOPICLENOL 0.5 MMOL/ML IV SOLN
8.0000 mL | Freq: Once | INTRAVENOUS | Status: AC | PRN
  Administered 2024-04-26: 8 mL via INTRAVENOUS

## 2024-06-25 ENCOUNTER — Other Ambulatory Visit: Payer: Self-pay | Admitting: Family Medicine

## 2024-07-16 ENCOUNTER — Other Ambulatory Visit: Payer: Self-pay | Admitting: Family Medicine

## 2024-07-22 ENCOUNTER — Ambulatory Visit

## 2024-07-22 VITALS — BP 120/60 | HR 94 | Temp 98.4°F | Ht 68.0 in | Wt 184.5 lb

## 2024-07-22 DIAGNOSIS — Z0000 Encounter for general adult medical examination without abnormal findings: Secondary | ICD-10-CM

## 2024-07-22 NOTE — Patient Instructions (Addendum)
 Ethan Anderson,  Thank you for taking the time for your Medicare Wellness Visit. I appreciate your continued commitment to your health goals. Please review the care plan we discussed, and feel free to reach out if I can assist you further.  Please note that Annual Wellness Visits do not include a physical exam. Some assessments may be limited, especially if the visit was conducted virtually. If needed, we may recommend an in-person follow-up with your provider.  Ongoing Care Seeing your primary care provider every 3 to 6 months helps us  monitor your health and provide consistent, personalized care.   Referrals If a referral was made during today's visit and you haven't received any updates within two weeks, please contact the referred provider directly to check on the status.  Recommended Screenings:  Health Maintenance  Topic Date Due   Zoster (Shingles) Vaccine (1 of 2) Never done   DTaP/Tdap/Td vaccine (2 - Td or Tdap) 08/18/2021   Flu Shot  03/11/2024   COVID-19 Vaccine (4 - 2025-26 season) 04/11/2024   Medicare Annual Wellness Visit  07/22/2025   Pneumococcal Vaccine for age over 79  Completed   Meningitis B Vaccine  Aged Out       07/22/2024    2:36 PM  Advanced Directives  Does Patient Have a Medical Advance Directive? Yes  Type of Estate Agent of [place];Living will  Does patient want to make changes to medical advance directive? No - Patient declined  Copy of Healthcare Power of Attorney in Chart? No - copy requested    Vision: Annual vision screenings are recommended for early detection of glaucoma, cataracts, and diabetic retinopathy. These exams can also reveal signs of chronic conditions such as diabetes and high blood pressure.  Dental: Annual dental screenings help detect early signs of oral cancer, gum disease, and other conditions linked to overall health, including heart disease and diabetes.  Please see the attached documents for additional  preventive care recommendations.

## 2024-07-22 NOTE — Progress Notes (Signed)
 Chief Complaint  Patient presents with   Medicare Wellness     Subjective:   Ethan Anderson is a 89 y.o. male who presents for a Medicare Annual Wellness Visit.  Visit info / Clinical Intake: Medicare Wellness Visit Type:: Subsequent Annual Wellness Visit Persons participating in visit and providing information:: patient (Wife present) Medicare Wellness Visit Mode:: In-person (required for WTM) Interpreter Needed?: No Pre-visit prep was completed: no AWV questionnaire completed by patient prior to visit?: no Living arrangements:: lives with spouse/significant other Patient's Overall Health Status Rating: excellent Typical amount of pain: none Does pain affect daily life?: no Are you currently prescribed opioids?: no  Dietary Habits and Nutritional Risks How many meals a day?: 3 Eats fruit and vegetables daily?: yes Most meals are obtained by: preparing own meals In the last 2 weeks, have you had any of the following?: none Diabetic:: no  Functional Status Activities of Daily Living (to include ambulation/medication): Independent Ambulation: Independent with device- listed below Home Assistive Devices/Equipment: Eyeglasses; Dentures (specify type) Medication Administration: Independent Home Management (perform basic housework or laundry): Independent Manage your own finances?: yes Primary transportation is: driving Concerns about vision?: no *vision screening is required for WTM* Concerns about hearing?: no  Fall Screening Falls in the past year?: 0 Number of falls in past year: 0 Was there an injury with Fall?: 0 Fall Risk Category Calculator: 0 Patient Fall Risk Level: Low Fall Risk  Fall Risk Patient at Risk for Falls Due to: No Fall Risks Fall risk Follow up: Falls evaluation completed  Home and Transportation Safety: All rugs have non-skid backing?: yes All stairs or steps have railings?: yes Grab bars in the bathtub or shower?: yes Have non-skid surface  in bathtub or shower?: yes Good home lighting?: yes Regular seat belt use?: yes Hospital stays in the last year:: no  Cognitive Assessment Difficulty concentrating, remembering, or making decisions? : no Will 6CIT or Mini Cog be Completed: yes What year is it?: 0 points What month is it?: 0 points Give patient an address phrase to remember (5 components): [address] [place]  About what time is it?: 0 points Count backwards from 20 to 1: 0 points Say the months of the year in reverse: 0 points Repeat the address phrase from earlier: 0 points 6 CIT Score: 0 points  Advance Directives (For Healthcare) Does Patient Have a Medical Advance Directive?: Yes Does patient want to make changes to medical advance directive?: No - Patient declined Type of Advance Directive: Healthcare Power of [place]; Living will Copy of Healthcare Power of Attorney in Chart?: No - copy requested Copy of Living Will in Chart?: No - copy requested  Reviewed/Updated  Reviewed/Updated: Reviewed All (Medical, Surgical, Family, Medications, Allergies, Care Teams, Patient Goals)    Allergies (verified) Patient has no known allergies.   Current Medications (verified) Outpatient Encounter Medications as of 07/22/2024  Medication Sig   amLODipine  (NORVASC ) 5 MG tablet TAKE ONE (1) TABLET EACH DAY   azaTHIOprine  (IMURAN ) 50 MG tablet Take 100 mg by mouth in the morning, at noon, and at bedtime. Patient is taking all 3 at one time   furosemide  (LASIX ) 40 MG tablet TAKE 1 AND 1/2 TABLETS DAILY   Lactulose  20 GM/30ML SOLN Take 30 mLs (20 g total) by mouth daily.   latanoprost (XALATAN) 0.005 % ophthalmic solution Place 1 drop into the right eye at bedtime.   meclizine  (ANTIVERT ) 12.5 MG tablet Take 1 tablet (12.5 mg total) by mouth 2 (  two) times daily as needed for dizziness.   Multiple Vitamins-Minerals (EYE VITAMINS PO) Take 2 capsules by mouth daily.    olmesartan  (BENICAR ) 5 MG tablet Take 1 tablet (5  mg total) by mouth daily.   omeprazole  (PRILOSEC) 20 MG capsule Take 1 capsule (20 mg total) by mouth 2 (two) times daily before a meal for 14 days.   potassium chloride  (KLOR-CON ) 10 MEQ tablet TAKE ONE (1) TABLET EACH DAY   No facility-administered encounter medications on file as of 07/22/2024.    History: Past Medical History:  Diagnosis Date   Arthritis    Cataract    ight eye and has been removed   Colon polyps    Depression    Glaucoma    HTN (hypertension)    Macular degeneration    Past Surgical History:  Procedure Laterality Date   CATARACT EXTRACTION Right    COLONOSCOPY     MULTIPLE TOOTH EXTRACTIONS     pt weard upper & lower dentures   POLYPECTOMY     UPPER GI ENDOSCOPY  09/21/2018   Family History  Problem Relation Age of Onset   Ovarian cancer Mother    Stomach cancer Mother    Dementia Father    Alcohol abuse Cousin    Thyroid  cancer Son    Other Grandchild        Primary Sclerosing Cholangitis   Colon cancer Neg Hx    Esophageal cancer Neg Hx    Rectal cancer Neg Hx    Pancreatic cancer Neg Hx    Prostate cancer Neg Hx    Social History   Occupational History   Occupation: retired  Tobacco Use   Smoking status: Former    Current packs/day: 0.00    Average packs/day: 1 pack/day for 10.0 years (10.0 ttl pk-yrs)    Types: Cigarettes    Start date: 08/18/1980    Quit date: 08/18/1990    Years since quitting: 33.9   Smokeless tobacco: Never  Vaping Use   Vaping status: Never Used  Substance and Sexual Activity   Alcohol use: No   Drug use: No   Sexual activity: Not on file   Tobacco Counseling Counseling given: No  SDOH Screenings   Food Insecurity: No Food Insecurity (07/22/2024)  Housing: Unknown (07/22/2024)  Transportation Needs: No Transportation Needs (07/22/2024)  Utilities: Not At Risk (07/22/2024)  Alcohol Screen: Low Risk (07/15/2023)  Depression (PHQ2-9): Low Risk (07/22/2024)  Financial Resource Strain: Low Risk (07/15/2023)   Physical Activity: Inactive (07/22/2024)  Social Connections: Socially Integrated (07/22/2024)  Stress: No Stress Concern Present (07/22/2024)  Tobacco Use: Medium Risk (07/22/2024)  Health Literacy: Adequate Health Literacy (07/22/2024)   See flowsheets for full screening details  Depression Screen PHQ 2 & 9 Depression Scale- Over the past 2 weeks, how often have you been bothered by any of the following problems? Little interest or pleasure in doing things: 0 Feeling down, depressed, or hopeless (PHQ Adolescent also includes...irritable): 0 PHQ-2 Total Score: 0     Goals Addressed               This Visit's Progress     Remain active (pt-stated)               Objective:    Today's Vitals   07/22/24 1432  BP: 120/60  Pulse: 94  Temp: 98.4 F (36.9 C)  TempSrc: Oral  SpO2: 99%  Weight: 184 lb 8 oz (83.7 kg)  Height: 5' 8 (1.727 m)  Body mass index is 28.05 kg/m.  Hearing/Vision screen Hearing Screening - Comments:: Denies hearing difficulties   Vision Screening - Comments:: Wears rx glasses - up to date with routine eye exams with  Dr JONELLE Moats Immunizations and Health Maintenance Health Maintenance  Topic Date Due   Zoster Vaccines- Shingrix (1 of 2) Never done   DTaP/Tdap/Td (2 - Td or Tdap) 08/18/2021   Influenza Vaccine  03/11/2024   COVID-19 Vaccine (4 - 2025-26 season) 04/11/2024   Medicare Annual Wellness (AWV)  07/22/2025   Pneumococcal Vaccine: 50+ Years  Completed   Meningococcal B Vaccine  Aged Out        Assessment/Plan:  This is a routine wellness examination for [facility].  Patient Care Team: Micheal Wolm ORN, MD as PCP - General (Family Medicine)  I have personally reviewed and noted the following in the patients chart:   Medical and social history Use of alcohol, tobacco or illicit drugs  Current medications and supplements including opioid prescriptions. Functional ability and status Nutritional status Physical  activity Advanced directives List of other physicians Hospitalizations, surgeries, and ER visits in previous 12 months Vitals Screenings to include cognitive, depression, and falls Referrals and appointments  No orders of the defined types were placed in this encounter.  In addition, I have reviewed and discussed with patient certain preventive protocols, quality metrics, and best practice recommendations. A written personalized care plan for preventive services as well as general preventive health recommendations were provided to patient.   Rojelio ORN Blush, LPN   [id number]   Return in 53 weeks (on 07/28/2025).  After Visit Summary: (In Person-Printed) AVS printed and given to the patient  Nurse Notes: HM Addressed: Flu vaccines deferred

## 2024-07-25 ENCOUNTER — Other Ambulatory Visit: Payer: Self-pay | Admitting: Nurse Practitioner

## 2024-07-25 DIAGNOSIS — K7469 Other cirrhosis of liver: Secondary | ICD-10-CM

## 2024-08-05 ENCOUNTER — Other Ambulatory Visit: Payer: Self-pay | Admitting: Family Medicine

## 2024-08-17 ENCOUNTER — Ambulatory Visit
Admission: RE | Admit: 2024-08-17 | Discharge: 2024-08-17 | Disposition: A | Discharge status: Home / Self Care | Source: Ambulatory Visit | Attending: Nurse Practitioner | Admitting: Nurse Practitioner

## 2024-08-17 DIAGNOSIS — K7469 Other cirrhosis of liver: Secondary | ICD-10-CM

## 2024-08-17 MED ORDER — GADOPICLENOL 0.5 MMOL/ML IV SOLN
8.0000 mL | Freq: Once | INTRAVENOUS | Status: AC | PRN
  Administered 2024-08-17: 8 mL via INTRAVENOUS

## 2024-09-01 ENCOUNTER — Other Ambulatory Visit: Payer: Self-pay | Admitting: Nurse Practitioner

## 2024-09-01 DIAGNOSIS — D376 Neoplasm of uncertain behavior of liver, gallbladder and bile ducts: Secondary | ICD-10-CM

## 2024-09-26 ENCOUNTER — Other Ambulatory Visit

## 2025-04-03 ENCOUNTER — Encounter (INDEPENDENT_AMBULATORY_CARE_PROVIDER_SITE_OTHER): Admitting: Ophthalmology

## 2025-07-28 ENCOUNTER — Ambulatory Visit
